# Patient Record
Sex: Female | Born: 2000
Health system: Southern US, Community
[De-identification: ages and names within clinical notes are randomized; demographics above are authoritative.]

## PROBLEM LIST (undated history)

## (undated) DIAGNOSIS — J45909 Unspecified asthma, uncomplicated: Secondary | ICD-10-CM

## (undated) HISTORY — DX: Unspecified asthma, uncomplicated: J45.909

## (undated) HISTORY — PX: BREAST REDUCTION SURGERY: SHX8

## (undated) HISTORY — PX: WISDOM TOOTH EXTRACTION: SHX21

---
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
## 2018-01-15 DIAGNOSIS — F419 Anxiety disorder, unspecified: Secondary | ICD-10-CM | POA: Diagnosis not present

## 2018-01-17 MED FILL — CITALOPRAM HBR 10 MG TABLET: 10 | 30 days supply | Qty: 30 | Fill #0

## 2018-02-05 MED FILL — LARIN FE 1-20 TABLET: 1-20 | 84 days supply | Qty: 84 | Fill #1

## 2018-02-20 ENCOUNTER — Ambulatory Visit (INDEPENDENT_AMBULATORY_CARE_PROVIDER_SITE_OTHER): Payer: 59

## 2018-02-20 ENCOUNTER — Ambulatory Visit (INDEPENDENT_AMBULATORY_CARE_PROVIDER_SITE_OTHER): Payer: 59 | Admitting: Orthopaedic Surgery

## 2018-02-20 ENCOUNTER — Other Ambulatory Visit (INDEPENDENT_AMBULATORY_CARE_PROVIDER_SITE_OTHER): Payer: Self-pay | Admitting: Radiology

## 2018-02-20 ENCOUNTER — Encounter (INDEPENDENT_AMBULATORY_CARE_PROVIDER_SITE_OTHER): Payer: Self-pay | Admitting: Orthopaedic Surgery

## 2018-02-20 VITALS — BP 131/78 | HR 86 | Ht 68.0 in | Wt 198.0 lb

## 2018-02-20 DIAGNOSIS — M25562 Pain in left knee: Secondary | ICD-10-CM

## 2018-02-20 MED ORDER — DICLOFENAC SODIUM 1 % TD GEL: TRANSDERMAL | 1 refills | Status: AC

## 2018-02-20 NOTE — Progress Notes (Signed)
Office Visit Note   Patient: Ashley Reed           Date of Birth: 2001/05/25           MRN: [id number] Visit Date: 02/20/2018              Requested by: No referring provider defined for this encounter. PCP: Kennon Holter, NP   Assessment & Plan: Visit Diagnoses:  1. 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  Physical therapy for quadricep strengthening exercises.  Voltaren gel.  Appropriate NSAIDs.  Office 4 to 6 weeks if no improvement consider local cortisone injection  Follow-Up Instructions: Return if symptoms worsen or fail to improve.   Orders:  Orders Placed This Encounter  Procedures  . XR KNEE 3 VIEW LEFT   No orders of the defined types were placed in this encounter.     Procedures: No procedures performed   Clinical Data: No additional findings.   Subjective: Chief Complaint  Patient presents with  . Left Knee - Pain  . New Patient (Initial Visit)    L KNEE PAIN FOR 3 WEEKS NO INJURY   17 year old female experienced insidious onset lateral anterior left knee pain 3 to 4 weeks ago.  She is had difficulty with bending stooping and squatting.  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  Had an exacerbation of her pain with repetitive bending.  No obvious effusion.  No injury.  Athletically active in the past without any significant injury.  No feeling of instability.  No numbness or tingling, no groin pain or back discomfort. HPI  Review of Systems  Constitutional: Negative for fatigue and fever.  HENT: Negative for ear pain.   Eyes: Negative for pain.  Respiratory: Negative for cough and shortness of breath.   Cardiovascular: Negative for leg swelling.  Gastrointestinal: Negative for constipation and diarrhea.  Genitourinary: Negative for difficulty urinating.  Musculoskeletal: Negative for back pain and neck pain.  Skin: Negative for rash.    Allergic/Immunologic: Negative for food allergies.  Neurological: Positive for weakness. Negative for numbness.  Hematological: Bruises/bleeds easily.  Psychiatric/Behavioral: Positive for sleep disturbance.     Objective: Vital Signs: BP 131/78 (BP Location: Right Arm, Patient Position: Sitting, Cuff Size: Normal)   Pulse 86   Ht 5\' 8"  (1.727 m)   Wt 198 lb (89.8 kg)   BMI 30.11 kg/m   Physical Exam  Ortho Exam awake alert and oriented x3.  Very pleasant.  Comfortable sitting.  Examination left knee with with a negative anterior drawer sign.  No opening with varus valgus stress.  No effusion.  No pain along the medial lateral joint.  No popping or clicking.  No popping or clicking with patella motion.  Full extension and flexion over 120 degrees.  There is tenderness along the lateral parapatellar region.  No plica formation.  Swelling distally.  Neurovascular exam intact  Specialty Comments:  No specialty comments available.  Imaging: Xr Knee 3 View Left  Result Date: 02/20/2018 Symptoms of left knee were obtained in 3 projections standing.  No evidence of abnormality in the AP and lateral views.  No acute change or ectopic calcification.  Joint spaces well-maintained.  Patella view there is a lateral patella tilt consistent with her symptoms of lateral patella pressure syndrome    PMFS History: There are no active problems to display for this patient.  History reviewed. No  pertinent past medical history.  History reviewed. No pertinent family history.  Past Surgical History:  Procedure Laterality Date  . BREAST REDUCTION SURGERY     Social History   Occupational History  . Not on file  Tobacco Use  . Smoking status: Never Smoker  . Smokeless tobacco: Never Used  Substance and Sexual Activity  . Alcohol use: Not Currently  . Drug use: Not Currently  . Sexual activity: Not on file

## 2018-02-21 ENCOUNTER — Telehealth (INDEPENDENT_AMBULATORY_CARE_PROVIDER_SITE_OTHER): Payer: Self-pay | Admitting: Orthopaedic Surgery

## 2018-02-21 MED FILL — DICLOFENAC SODIUM 1% GEL: 1 | 17 days supply | Qty: 200 | Fill #0

## 2018-02-21 NOTE — Telephone Encounter (Signed)
Ashley Reed from [facility] left a voicemail regarding patient's prescription for Voltaren 1% Gel.  We need to get the amount (1-4 grams)  that she has to apply to her large joint area.  Please return our call at [phone]

## 2018-02-21 NOTE — Telephone Encounter (Signed)
Spoke with pharmacist to fill rx. No action required

## 2018-02-21 NOTE — Telephone Encounter (Signed)
Notified pharmacy of amount to apply to area

## 2018-02-21 NOTE — Telephone Encounter (Signed)
Patient's mom called stating she went to [place] to pick up the prescription of Diclofenac Sodium but was told they didn't get the prescription.

## 2018-03-04 ENCOUNTER — Other Ambulatory Visit: Payer: Self-pay

## 2018-03-04 ENCOUNTER — Encounter: Payer: Self-pay | Admitting: Rehabilitative and Restorative Service Providers"

## 2018-03-04 ENCOUNTER — Ambulatory Visit: Payer: 59 | Attending: Orthopaedic Surgery | Admitting: Rehabilitative and Restorative Service Providers"

## 2018-03-04 DIAGNOSIS — M25562 Pain in left knee: Secondary | ICD-10-CM | POA: Diagnosis not present

## 2018-03-04 DIAGNOSIS — G8929 Other chronic pain: Secondary | ICD-10-CM | POA: Diagnosis not present

## 2018-03-04 DIAGNOSIS — M6281 Muscle weakness (generalized): Secondary | ICD-10-CM | POA: Diagnosis not present

## 2018-03-04 DIAGNOSIS — F419 Anxiety disorder, unspecified: Secondary | ICD-10-CM | POA: Diagnosis not present

## 2018-03-04 NOTE — Therapy (Signed)
[place], [place], [address] Phone: [phone]   Fax:  [phone]  Physical Therapy Evaluation  Patient Details  Name: Ashley Reed MRN: [id number] Date of Birth: 14-Sep-2001 Referring Provider: Durward Fortes   Encounter Date: 03/04/2018  PT End of Session - 03/04/18 1221    Visit Number  1    Number of Visits  12    Date for PT Re-Evaluation  04/15/18    PT Start Time  0930    PT Stop Time  1005    PT Time Calculation (min)  35 min    Activity Tolerance  Patient tolerated treatment well;No increased pain    Behavior During Therapy  [facility] for tasks assessed/performed       History reviewed. No pertinent past medical history.  Past Surgical History:  Procedure Laterality Date  . BREAST REDUCTION SURGERY      There were no vitals filed for this visit.   Subjective Assessment - 03/04/18 0930    Subjective  It hurts when I squat and stoop and play with kids.    Pertinent History  Pt is a Ship broker at [employer]. She is active at school and per her history. Has history of R knee pain but L knee pain has begun insidiously over the last several weeks. per chart, she has diagnosis of Lateral Patella Pressure syndrome L knee with chondromalacia patella. MD wants quad strengthening. RTD in 4-6 weeks for possible local cortisone injection if no change in pain. Xray 02/20/18 showing only a lateral patella tilt with no other findings.     Limitations  Lifting;Standing;Other (comment) stairs    How long can you sit comfortably?  > 60 minutes    How long can you stand comfortably?  > 60 minutes    How long can you walk comfortably?  1 hour    Diagnostic tests  Xray shows lateral patella tilt 02/20/18    Patient Stated Goals  to be able to do stairs and run without difficulty or pain    Currently in Pain?  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      Prior Function   Level of Independence  Independent      Cognition   Overall Cognitive Status  Within Functional Limits for tasks assessed      Coordination   Gross Motor Movements are Fluid and Coordinated  Yes      Functional Tests   Functional tests  -- able to squat but with pain      ROM / Strength   AROM / PROM / Strength  AROM;Strength  AROM   Overall AROM Comments  AROM WNL and =      Strength   Overall Strength Comments  Bil LE strength 5/5      Palpation   Palpation comment  hypermobility L knee all planes, moderate swelling inferior patella; with quad set, patella immediately shifts lateral, vastus lateralis and ITB overactive L      Special Tests   Other special tests  all special tests -                Objective measurements completed on examination: See above findings.                PT Short Term Goals - 03/04/18 1216      PT SHORT TERM GOAL #1   Title  Pt will be I with initial HEP    Baseline  issued at eval    Time  2    Period  Weeks    Status  New    Target Date  03/18/18      PT SHORT TERM GOAL #2   Title  Pt will report 50% reduced L knee pain with squatting    Baseline  unable without discomfort    Time  2    Period  Weeks    Status  New        PT Long Term Goals - 03/04/18 1217      PT LONG TERM GOAL #1   Title  Pt will be able to run down a hill without L knee pain    Baseline   unable    Time  6    Period  Weeks    Status  New    Target Date  04/15/18      PT LONG TERM GOAL #2   Title  Pt will be able to squat to the floor without pain    Baseline  unable    Time  6    Period  Weeks    Status  New    Target Date  04/15/18      PT LONG TERM GOAL #3   Title  Pt will be able to return to working out without difficulty    Baseline  unable    Time  6    Period  Weeks    Status  New    Target Date  04/15/18             Plan - 03/04/18 1010    Clinical Impression Statement  Pt presents to PT with L shifting patella all directions but with emphasis on lateral shift with quad activation. She has moderate swelling inferopatella. Strength and AROM WNL and =. Pt would benefit from PT for L vastus lateralis and ITB flexibility exercises, L hip adductor/extensor strengthening, and patella taping as appropriate to assist with proper muscle activation.     History and Personal Factors relevant to plan of care:  pt is active in her community; student at [employer] Presentation  Stable    Clinical Presentation due to:  pt motivation to get stronger and return to PLOF without difficulty or deficit    Clinical Decision Making  Moderate    Rehab Potential  Excellent    PT Frequency  Other (comment) 2x/week every 1- 2 weeks as needed    PT Duration  Other (comment) pt to be seen 1x in 2 weeks to assess pain and schedule accordingly depending on reports  PT Treatment/Interventions  ADLs/Self Care Home Management;Manual techniques;Functional mobility training;Therapeutic activities;Patient/family education;Taping;Vasopneumatic Device;Therapeutic exercise;Cryotherapy    PT Next Visit Plan  review HEP, continue L ITB/lateral quad flexibility, taping to correct lateral shifting patella (?), inner thigh and hip extensor strengthening    PT Home Exercise Plan  bridge with ball squeeze, L ITB stretch with sheet, L Piriformis stretch, L quad stretch, sidelying hip  adduction    Recommended Other Services  n/a    Consulted and Agree with Plan of Care  Patient       Patient will benefit from skilled therapeutic intervention in order to improve the following deficits and impairments:  Pain, Hypermobility, Decreased activity tolerance  Visit Diagnosis: Muscle weakness (generalized)  Chronic pain of left knee     Problem List There are no active problems to display for this patient.   Myra Rude, PT 03/04/2018, 12:35 PM  [facility] [address] [place], [place], [address] Phone: [phone]   Fax:  [phone]  Name: Ashley Reed MRN: [id number] Date of Birth: 2001-03-17

## 2018-03-04 NOTE — Patient Instructions (Addendum)
HO

## 2018-03-18 ENCOUNTER — Ambulatory Visit: Payer: 59 | Attending: Orthopaedic Surgery

## 2018-03-18 DIAGNOSIS — G8929 Other chronic pain: Secondary | ICD-10-CM | POA: Insufficient documentation

## 2018-03-18 DIAGNOSIS — M6281 Muscle weakness (generalized): Secondary | ICD-10-CM | POA: Diagnosis not present

## 2018-03-18 DIAGNOSIS — M25562 Pain in left knee: Secondary | ICD-10-CM | POA: Diagnosis not present

## 2018-03-18 NOTE — Patient Instructions (Addendum)
Hip Extension: Hamstring Single Leg Deadlift (Eccentric)   Holding weights, stand on affected leg with knee slightly flexed. Lift other leg while slowly bending forward at the hip. Use ___ lb weight. ___ reps per set, ___ sets per day, ___ days per week. Add ___ lbs when you achieve ___ repetitions. Touch floor with weight.  Copyright  VHI. All rights reserved.  Squat: Wide Leg   Feet wide apart, toes out, squat, holding weight between knees. Weight should be at ankles.  Bend at the hips with hips going back behind hips. Keep back straight.  Put eight on stool if you cannot get to floor.  Repeat _3-15___ times per set. Do ___1-2_ sets per session. Do __2-5__ sessions per week. Use __0-10__ lb weight.  Copyright  VHI. All rights reserved.  Squat: Half   Arms hanging at sides, squat by dropping hips back as if sitting on a chair. Keep knees over ankles, hips behind heels.  Keep back straight.  Bend at hips and knees will bend with hips.   Repeat ___3-15_ times per set. Do __1-2__ sets per session. Do __2-5__ sessions per week. Use __0-10HIP / KNEE: Flexion / Extension, Squat Unilateral Hip / Glute Extension: Standing - Straight Leg (Machine) Hip Extension (Standing) Healthy Back - Yoga Tree Balance Wall Sit    Back against wall, slide down so knees are at 90 angle. Hold __5-10__ seconds. Do __1-2__ sets. Complete __10-20__ repetitions.  http://st.exer.us/295   Copyright  VHI. All rights reserved.

## 2018-03-18 NOTE — Therapy (Addendum)
[place], [place], [address] Phone: [phone]   Fax:  [phone]  Physical Therapy Treatment/ Discharge  Patient Details  Name: Ashley Reed MRN: [id number] Date of Birth: 2001-04-21 Referring Provider: Durward Fortes   Encounter Date: 03/18/2018  PT End of Session - 03/18/18 1535    Visit Number  2    Number of Visits  12    Date for PT Re-Evaluation  04/15/18    PT Start Time  0335    PT Stop Time  0415    PT Time Calculation (min)  40 min    Activity Tolerance  Patient tolerated treatment well;No increased pain    Behavior During Therapy  [facility] for tasks assessed/performed       History reviewed. No pertinent past medical history.  Past Surgical History:  Procedure Laterality Date  . BREAST REDUCTION SURGERY      There were no vitals filed for this visit.  Subjective Assessment - 03/18/18 1536    Subjective  Since last visit pain variable . Pain mor proximal patella .   No trouble with HEP.    No taping of patella.      Currently in Pain?  No/denies                       [facility] Adult PT Treatment/Exercise - 03/18/18 0001      Exercises   Exercises  Knee/Hip      Knee/Hip Exercises: Stretches   Passive Hamstring Stretch  Left;30 seconds;1 rep    Quad Stretch  Left;1 rep;30 seconds    Piriformis Stretch  Left;30 seconds      Knee/Hip Exercises: Standing   Wall Squat  15 reps;5 seconds    Other Standing Knee Exercises  single leg and double leg hip hinge x 15 LT       Knee/Hip Exercises: Supine   Bridges with Ball Squeeze  Both;15 reps    Straight Leg Raise with External Rotation  Left;15 reps      Manual Therapy   Manual Therapy  Taping    Kinesiotex  Facilitate Muscle      Kinesiotix   Facilitate Muscle   proximal medial taping for VMO and medical patella tracking             PT Education - 03/18/18 1623    Education Details  HEP . tape management to  remove with any skin irritation or in 3-4 days and can shower with tape    Person(s) Educated  Patient    Methods  Explanation;Demonstration;Tactile cues;Verbal cues;Handout    Comprehension  Verbalized understanding;Returned demonstration       PT Short Term Goals - 03/18/18 1620      PT SHORT TERM GOAL #1   Title  Pt will be I with initial HEP    Status  Achieved      PT SHORT TERM GOAL #2   Title  Pt will report 50% reduced L knee pain with squatting    Baseline  cont discomfort    Status  On-going        PT Long Term Goals - 03/04/18 1217      PT LONG TERM GOAL #1   Title  Pt will be able to run down a hill without L knee pain    Baseline  unable    Time  6    Period  Weeks    Status  New  Target Date  04/15/18      PT LONG TERM GOAL #2   Title  Pt will be able to squat to the floor without pain    Baseline  unable    Time  6    Period  Weeks    Status  New    Target Date  04/15/18      PT LONG TERM GOAL #3   Title  Pt will be able to return to working out without difficulty    Baseline  unable    Time  6    Period  Weeks    Status  New    Target Date  04/15/18            Plan - 03/18/18 1535    Clinical Impression Statement  Her pain is improved some with pain proximal patella now. She was able to do the new exercises  without increased pain thiugh she was cautioned to modify if haviung any pain.   She will return in 7-10 days and we will progress exercises if able and change taping if needed    PT Treatment/Interventions  ADLs/Self Care Home Management;Manual techniques;Functional mobility training;Therapeutic activities;Patient/family education;Taping;Vasopneumatic Device;Therapeutic exercise;Cryotherapy    PT Next Visit Plan  , continue L ITB/lateral quad flexibility, taping to correct lateral shifting patella (?), inner thigh and hip extensor strengthening review new HEP and taping    PT Home Exercise Plan  bridge with ball squeeze, L ITB stretch  with sheet, L Piriformis stretch, L quad stretch, sidelying hip adduction,   7/2 dead lift , single leg hip hinge , wall sit    Consulted and Agree with Plan of Care  Patient       Patient will benefit from skilled therapeutic intervention in order to improve the following deficits and impairments:  Pain, Hypermobility, Decreased activity tolerance  Visit Diagnosis: Muscle weakness (generalized)  Chronic pain of left knee     Problem List There are no active problems to display for this patient.   Darrel Hoover  PT 03/18/2018, 4:24 PM  [facility] [address] [place], [place], [address] Phone: [phone]   Fax:  [phone]  Name: LAKYNN HALVORSEN MRN: [id number] Date of Birth: [birth year]  PHYSICAL THERAPY DISCHARGE SUMMARY  Visits from Start of Care: 2  Current functional level related to goals / functional outcomes: Unknown as she did not return   Remaining deficits: Unknown   Education / Equipment: HEP Plan:                                                    Patient goals were not met. Patient is being discharged due to not returning since the last visit.  ?????    Noralee Stain PT

## 2018-04-01 ENCOUNTER — Telehealth: Payer: Self-pay | Admitting: Physical Therapy

## 2018-04-01 ENCOUNTER — Ambulatory Visit: Payer: 59

## 2018-04-01 NOTE — Telephone Encounter (Signed)
Message left that she had no more scheduled appointments and asked for her to call if she wants discharge or continuation of PT. Clinic number left on message.

## 2018-04-08 ENCOUNTER — Ambulatory Visit: Payer: 59

## 2018-04-28 MED FILL — NORETHIN-ESTRAD-FERR 1-0.02: 1-20 | 84 days supply | Qty: 84 | Fill #2

## 2018-04-28 MED FILL — CITALOPRAM HBR 10 MG TABLET: 10 | 30 days supply | Qty: 30 | Fill #1

## 2018-06-13 ENCOUNTER — Ambulatory Visit (INDEPENDENT_AMBULATORY_CARE_PROVIDER_SITE_OTHER): Payer: 59

## 2018-06-13 ENCOUNTER — Ambulatory Visit: Payer: Self-pay

## 2018-06-13 DIAGNOSIS — Z23 Encounter for immunization: Secondary | ICD-10-CM | POA: Diagnosis not present

## 2018-07-21 ENCOUNTER — Telehealth: Payer: 59 | Admitting: Nurse Practitioner

## 2018-07-21 ENCOUNTER — Encounter (INDEPENDENT_AMBULATORY_CARE_PROVIDER_SITE_OTHER): Payer: Self-pay

## 2018-07-21 DIAGNOSIS — M5441 Lumbago with sciatica, right side: Secondary | ICD-10-CM

## 2018-07-21 DIAGNOSIS — M5442 Lumbago with sciatica, left side: Secondary | ICD-10-CM | POA: Diagnosis not present

## 2018-07-21 MED ORDER — NAPROXEN 500 MG PO TABS: 500.0000 mg | ORAL_TABLET | Freq: Two times a day (BID) | ORAL | 1 refills | Status: DC

## 2018-07-21 MED ORDER — CYCLOBENZAPRINE HCL 10 MG PO TABS: 10.0000 mg | ORAL_TABLET | Freq: Three times a day (TID) | ORAL | 1 refills | Status: DC | PRN

## 2018-07-21 NOTE — Progress Notes (Signed)

## 2018-07-23 MED FILL — NORETHIN-ESTRAD-FERR 1-0.02: 1-20 | 84 days supply | Qty: 84 | Fill #3

## 2018-08-11 ENCOUNTER — Telehealth: Payer: 59 | Admitting: Physician Assistant

## 2018-08-11 DIAGNOSIS — J029 Acute pharyngitis, unspecified: Secondary | ICD-10-CM

## 2018-08-11 MED ORDER — AMOXICILLIN-POT CLAVULANATE 875-125 MG PO TABS: 1.0000 | ORAL_TABLET | Freq: Two times a day (BID) | ORAL | 0 refills | Status: DC

## 2018-08-11 NOTE — Progress Notes (Signed)
We are sorry that you are not feeling well.  Here is how we plan to help!  Based on what you have shared with me it is likely that you have strep pharyngitis.  Strep pharyngitis is inflammation and infection in the back of the throat.  This is an infection cause by bacteria and is treated with antibiotics.  I have prescribed Augmentin 875mg /125mg  one tablet twice daily with food, for 7 days.  For throat pain, we recommend over the counter oral pain relief medications such as acetaminophen or aspirin, or anti-inflammatory medications such as ibuprofen or naproxen sodium. Topical treatments such as oral throat lozenges or sprays may be used as needed. Strep infections are not as easily transmitted as other respiratory infections, however we still recommend that you avoid close contact with loved ones, especially the very young and elderly.  Remember to wash your hands thoroughly throughout the day as this is the number one way to prevent the spread of infection and wipe down door knobs and counters with disinfectant.   Home Care:  Only take medications as instructed by your medical team.  Complete the entire course of an antibiotic.  Do not take these medications with alcohol.  A steam or ultrasonic humidifier can help congestion.  You can place a towel over your head and breathe in the steam from hot water coming from a faucet.  Avoid close contacts especially the very young and the elderly.  Cover your mouth when you cough or sneeze.  Always remember to wash your hands.  Get Help Right Away If:  You develop worsening fever or sinus pain.  You develop a severe head ache or visual changes.  Your symptoms persist after you have completed your treatment plan.  Make sure you  Understand these instructions.  Will watch your condition.  Will get help right away if you are not doing well or get worse.  Your e-visit answers were reviewed by a board certified advanced clinical practitioner  to complete your personal care plan.  Depending on the condition, your plan could have included both over the counter or prescription medications.  If there is a problem please reply  once you have received a response from your provider.  Your safety is important to [place].  If you have drug allergies check your prescription carefully.    You can use MyChart to ask questions about today's visit, request a non-urgent call back, or ask for a work or school excuse for 24 hours related to this e-Visit. If it has been greater than 24 hours you will need to follow up with your provider, or enter a new e-Visit to address those concerns.  You will get an e-mail in the next two days asking about your experience.  I hope that your e-visit has been valuable and will speed your recovery. Thank you for using e-visits.

## 2018-08-12 ENCOUNTER — Telehealth: Payer: 59 | Admitting: Nurse Practitioner

## 2018-08-12 ENCOUNTER — Ambulatory Visit (INDEPENDENT_AMBULATORY_CARE_PROVIDER_SITE_OTHER): Payer: 59 | Admitting: Family Medicine

## 2018-08-12 ENCOUNTER — Encounter (INDEPENDENT_AMBULATORY_CARE_PROVIDER_SITE_OTHER): Payer: Self-pay | Admitting: Family Medicine

## 2018-08-12 DIAGNOSIS — M545 Low back pain, unspecified: Secondary | ICD-10-CM

## 2018-08-12 DIAGNOSIS — J020 Streptococcal pharyngitis: Secondary | ICD-10-CM | POA: Diagnosis not present

## 2018-08-12 MED ORDER — AMOXICILLIN-POT CLAVULANATE 875-125 MG PO TABS: 1.0000 | ORAL_TABLET | Freq: Two times a day (BID) | ORAL | 0 refills | Status: DC

## 2018-08-12 MED ORDER — AMOXICILLIN 875 MG PO TABS: 875.0000 mg | ORAL_TABLET | Freq: Two times a day (BID) | ORAL | 0 refills | Status: DC

## 2018-08-12 MED ORDER — TIZANIDINE HCL 2 MG PO TABS: 2.0000 mg | ORAL_TABLET | Freq: Four times a day (QID) | ORAL | 1 refills | Status: DC | PRN

## 2018-08-12 MED FILL — tiZANidine HCL 2 MG TABS: 2 | 7 days supply | Qty: 60 | Fill #0
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
## 2018-08-12 NOTE — Progress Notes (Signed)
We are sorry that you are not feeling well.  Here is how we plan to help!  Based on what you have shared with me it is likely that you have strep pharyngitis.  Strep pharyngitis is inflammation and infection in the back of the throat.  This is an infection cause by bacteria and is treated with antibiotics.  I have prescribed Augmentin 875mg /125mg  one tablet twice daily with food, for 7 days..  For throat pain, we recommend over the counter oral pain relief medications such as acetaminophen or aspirin, or anti-inflammatory medications such as ibuprofen or naproxen sodium. Topical treatments such as oral throat lozenges or sprays may be used as needed. Strep infections are not as easily transmitted as other respiratory infections, however we still recommend that you avoid close contact with loved ones, especially the very young and elderly.  Remember to wash your hands thoroughly throughout the day as this is the number one way to prevent the spread of infection and wipe down door knobs and counters with disinfectant.   Home Care:  Only take medications as instructed by your medical team.  Complete the entire course of an antibiotic.  Do not take these medications with alcohol.  A steam or ultrasonic humidifier can help congestion.  You can place a towel over your head and breathe in the steam from hot water coming from a faucet.  Avoid close contacts especially the very young and the elderly.  Cover your mouth when you cough or sneeze.  Always remember to wash your hands.  Get Help Right Away If:  You develop worsening fever or sinus pain.  You develop a severe head ache or visual changes.  Your symptoms persist after you have completed your treatment plan.  Make sure you  Understand these instructions.  Will watch your condition.  Will get help right away if you are not doing well or get worse.  Your e-visit answers were reviewed by a board certified advanced clinical  practitioner to complete your personal care plan.  Depending on the condition, your plan could have included both over the counter or prescription medications.  If there is a problem please reply  once you have received a response from your provider.  Your safety is important to [place].  If you have drug allergies check your prescription carefully.    You can use MyChart to ask questions about today's visit, request a non-urgent call back, or ask for a work or school excuse for 24 hours related to this e-Visit. If it has been greater than 24 hours you will need to follow up with your provider, or enter a new e-Visit to address those concerns.  You will get an e-mail in the next two days asking about your experience.  I hope that your e-visit has been valuable and will speed your recovery. Thank you for using e-visits.

## 2018-09-15 ENCOUNTER — Ambulatory Visit (INDEPENDENT_AMBULATORY_CARE_PROVIDER_SITE_OTHER): Payer: 59 | Admitting: Orthopaedic Surgery

## 2018-09-16 DIAGNOSIS — Z113 Encounter for screening for infections with a predominantly sexual mode of transmission: Secondary | ICD-10-CM | POA: Diagnosis not present

## 2018-09-16 DIAGNOSIS — N760 Acute vaginitis: Secondary | ICD-10-CM | POA: Diagnosis not present

## 2018-09-16 DIAGNOSIS — Z01419 Encounter for gynecological examination (general) (routine) without abnormal findings: Secondary | ICD-10-CM | POA: Diagnosis not present

## 2018-09-16 DIAGNOSIS — Z6831 Body mass index (BMI) 31.0-31.9, adult: Secondary | ICD-10-CM | POA: Diagnosis not present

## 2018-09-18 ENCOUNTER — Encounter (INDEPENDENT_AMBULATORY_CARE_PROVIDER_SITE_OTHER): Payer: Self-pay | Admitting: Orthopaedic Surgery

## 2018-09-18 ENCOUNTER — Ambulatory Visit (INDEPENDENT_AMBULATORY_CARE_PROVIDER_SITE_OTHER): Payer: 59

## 2018-09-18 ENCOUNTER — Ambulatory Visit (INDEPENDENT_AMBULATORY_CARE_PROVIDER_SITE_OTHER): Payer: 59 | Admitting: Orthopaedic Surgery

## 2018-09-18 VITALS — BP 124/84 | HR 77 | Ht 69.0 in | Wt 217.0 lb

## 2018-09-18 DIAGNOSIS — M545 Low back pain, unspecified: Secondary | ICD-10-CM
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
## 2018-10-13 MED FILL — BLISOVI FE 1/20 1-20 MG-MCG: 1-20 | 84 days supply | Qty: 84 | Fill #0

## 2018-11-20 ENCOUNTER — Telehealth: Payer: 59 | Admitting: Physician Assistant

## 2018-11-20 DIAGNOSIS — J020 Streptococcal pharyngitis: Secondary | ICD-10-CM

## 2018-11-20 MED ORDER — AMOXICILLIN-POT CLAVULANATE 875-125 MG PO TABS: 1.0000 | ORAL_TABLET | Freq: Two times a day (BID) | ORAL | 0 refills | Status: DC

## 2018-11-20 NOTE — Progress Notes (Signed)
I have spent 5 minutes in review of e-visit questionnaire, review and updating patient chart, medical decision making and response to patient.   Sparrow Sanzo Cody Tenzin Edelman, PA-C    

## 2018-11-20 NOTE — Progress Notes (Signed)

## 2018-12-29 MED FILL — BLISOVI FE 1/20 1-20 MG-MCG: 1-20 | 84 days supply | Qty: 84 | Fill #0

## 2019-01-26 DIAGNOSIS — K30 Functional dyspepsia: Secondary | ICD-10-CM | POA: Diagnosis not present

## 2019-01-26 DIAGNOSIS — R1011 Right upper quadrant pain: Secondary | ICD-10-CM | POA: Diagnosis not present

## 2019-01-29 DIAGNOSIS — K30 Functional dyspepsia: Secondary | ICD-10-CM | POA: Diagnosis not present

## 2019-01-29 DIAGNOSIS — R109 Unspecified abdominal pain: Secondary | ICD-10-CM | POA: Diagnosis not present

## 2019-03-03 MED FILL — tiZANidine HCL 2 MG TABS: 2 | 7 days supply | Qty: 60 | Fill #1

## 2019-03-11 DIAGNOSIS — R51 Headache: Secondary | ICD-10-CM | POA: Diagnosis not present

## 2019-03-11 MED FILL — PROPRANOLOL HCL 10 MG TAB: 10 | 30 days supply | Qty: 30 | Fill #0

## 2019-03-30 MED FILL — BLISOVI FE 1/20 1-20 MG-MCG: 1-20 | 84 days supply | Qty: 84 | Fill #1

## 2019-04-03 ENCOUNTER — Encounter: Payer: Self-pay | Admitting: Allergy & Immunology

## 2019-04-03 ENCOUNTER — Other Ambulatory Visit: Payer: Self-pay

## 2019-04-03 ENCOUNTER — Ambulatory Visit (INDEPENDENT_AMBULATORY_CARE_PROVIDER_SITE_OTHER): Payer: 59 | Admitting: Allergy & Immunology

## 2019-04-03 DIAGNOSIS — J3089 Other allergic rhinitis: Secondary | ICD-10-CM | POA: Diagnosis not present

## 2019-04-03 DIAGNOSIS — J4520 Mild intermittent asthma, uncomplicated: Secondary | ICD-10-CM

## 2019-04-03 DIAGNOSIS — J0100 Acute maxillary sinusitis, unspecified: Secondary | ICD-10-CM

## 2019-04-03 MED ORDER — AMOXICILLIN-POT CLAVULANATE 875-125 MG PO TABS: 1.0000 | ORAL_TABLET | Freq: Two times a day (BID) | ORAL | 0 refills | Status: AC

## 2019-04-03 MED FILL — AMOX-CLAV 875-125 MG TABLET: 875-125 | 10 days supply | Qty: 20 | Fill #0

## 2019-04-03 NOTE — Patient Instructions (Addendum)
1. Perennial allergic rhinitis (dust mites) - Dust mite avoidance measures provided. - Start Nasacort one spray per nostril daily throughout the year to see if this can prevent the chronic congestion.  2. Mild intermittent asthma, uncomplicated - Continue with the as needed use of the Advair one puff twice daily for 1-2 weeks during flares. - Continue with albuterol 4 puffs every 4-6 hours as needed. - Consider changing to a different PRN medication, as Advair Diskus is not frequently covered well by insurance companies.   3. Acute non-recurrent maxillary sinusitis - With your current symptoms and time course, antibiotics are needed: Augmentin 875mg  twice daily for 10 days - If symptoms are not improving in 3-4 days, feel free to call us back and we can add on a prednisone burst.  - Add on nasal saline spray (i.e., Simply Saline) or nasal saline lavage (i.e., NeilMed) as needed prior to medicated nasal sprays. - Continue with guaifenesin [phone] mg (Mucinex) twice daily as needed for mucous thinning with adequate hydration to help it work.   4. Return in about 1 year (around 04/02/2020). This can be an in-person, a virtual Webex or a telephone follow up visit.   Please inform us of any Emergency Department visits, hospitalizations, or changes in symptoms. Call us before going to the ED for breathing or allergy symptoms since we might be able to fit you in for a sick visit. Feel free to contact us anytime with any questions, problems, or concerns.  It was a pleasure to talk to you today today!  Websites that have reliable patient information: 1. American Academy of Asthma, Allergy, and Immunology: www.aaaai.org 2. Food Allergy Research and Education (FARE): foodallergy.org 3. Mothers of Asthmatics: http://www.asthmacommunitynetwork.org 4. American College of Allergy, Asthma, and Immunology: www.acaai.org  "Like" [place] on Facebook and Instagram for our latest updates!      Make sure you  are registered to vote! If you have moved or changed any of your contact information, you will need to get this updated before voting!  In some cases, you MAY be able to register to vote online: [email or web address]    Voter ID laws are NOT going into effect for the General Election in November 2020! DO NOT let this stop you from exercising your right to vote!   Absentee voting is the SAFEST way to vote during the coronavirus pandemic!   Download and print an absentee ballot request form at [email or web address] or you can scan the QR code below with your smart phone:      More information on absentee ballots can be found here: [email or web address]

## 2019-04-03 NOTE — Progress Notes (Signed)
RE: RANADA VIGORITO MRN: [id number] DOB: 07/19/2001 Date of Telemedicine Visit: 04/03/2019  Referring provider: No ref. provider found Primary care provider: Nathaneil Canary, MD  Chief Complaint: Sinusitis   Telemedicine Follow Up Visit via Telephone: I connected with Carlisle Beers for a follow up on 04/03/19 by telephone and verified that I am speaking with the correct person using two identifiers.   I discussed the limitations, risks, security and privacy concerns of performing an evaluation and management service by telephone and the availability of in person appointments. I also discussed with the patient that there may be a patient responsible charge related to this service. The patient expressed understanding and agreed to proceed.  Patient is at work accompanied by her mother who provided/contributed to the history.  Provider is at the office.  Visit start time: 8:40 AM Visit end time: 8:53 AM Insurance consent/check in by: [facility] consent and medical assistant/nurse: Ashleigh  History of Present Illness:  She is a 18 y.o. female, who is being followed for allergic rhinitis and asthma. Her previous allergy office visit was in July 2015 with Dr. Neldon Mc.  She was last seen in our office and 2015 by Dr. Neldon Mc.  At that time, her asthma was under good control with the as needed use of albuterol and Advair 100/50 mcg.  She was having some intermittent urticaria and Dr. Neldon Mc recommended the use of prednisone at the first sign of an urticarial outbreak.  Her last testing was done in July 2015 and was positive only to dust mites.  She also had the entire food panel tested and this was negative. She has received flu shots intermittently since that   Since the last month, she has been having sinus pressure and headaches intermittently. She has been using Mucinex and Sudafed for one month. She reports that she takes Mucinex more often than not. She does not use a daily nose sprays. She  starts this when she starts having congestion.  She has never needed antibiotics for sinus infections. She did have Strep throat on a few occasions, but normally never gets antibiotics for sinus infections.  She was just had to use her Mucinex for a month or so and then she feels felt better for a week before it returns.  I did review her allergy testing from 5 years ago and she is not doing anything to mitigate dust mite exposure, but she is very open to doing this.  She does have a history of asthma, although it is not a problem very often at all.  She has some Advair at home which she uses for a week or 2 when she feels short of breath.  She has 1 more unopened box at home.  She does not know how much it is with her insurance, which gives you an idea of how often she actually uses it.  She denies nighttime coughing or wheezing on a routine basis.  She has not needed prednisone for her breathing in quite some time.  Of note, she has migraines. She started on naproxen at night for her migraines. This was prescribe by her PCP in [place] - Dr. Nathaneil Canary at [facility] in [place].   Otherwise, there have been no changes to her past medical history, surgical history, family history, or social history.  Assessment and Plan:  Hydia is a 18 y.o. female with:  Perennial allergic rhinitis (dust mites)  Mild intermittent asthma, uncomplicated  Acute non-recurrent maxillary sinusitis   [facility]  presents for a sick visit.  She reports nasal congestion and sinus pain for the past month.  This has been unresponsive to Sudafed as well as Mucinex.  Because of this, we are going to start a course of Augmentin.  She will call us back and let us know how she is doing on Monday and we could start a prednisone burst if needed at that time.  I did review her chart and she is allergic to dust mites.  This seems to be a surprise to her.  I did recommend that she get bedcovers for both the bed and the  pillow to mitigate dust mite exposure.  I also think it would be useful for her to do a nasal steroid 1 spray per nostril daily to try to control the allergic inflammation.  This might also help her headaches in the long run as well.  We did provide samples of Nasacort for her to use.  Her asthma seems to be well controlled for the most part.  She does have Advair which she is used for years on an as-needed basis.  This is not the traditional use of a combined ICS/LABA, but since it is working for her we will continue with that.  I did offer to change her to an alternative to Advair if it became too expensive for her.    1. Perennial allergic rhinitis (dust mites) - Dust mite avoidance measures provided. - Start Nasacort one spray per nostril daily throughout the year to see if this can prevent the chronic congestion.  2. Mild intermittent asthma, uncomplicated - Continue with the as needed use of the Advair one puff twice daily for 1-2 weeks during flares. - Continue with albuterol 4 puffs every 4-6 hours as needed. - Consider changing to a different PRN medication, as Advair Diskus is not frequently covered well by insurance companies.   3. Acute non-recurrent maxillary sinusitis - With your current symptoms and time course, antibiotics are needed: Augmentin 875mg  twice daily for 10 days - If symptoms are not improving in 3-4 days, feel free to call us back and we can add on a prednisone burst.  - Add on nasal saline spray (i.e., Simply Saline) or nasal saline lavage (i.e., NeilMed) as needed prior to medicated nasal sprays. - Continue with guaifenesin [phone] mg (Mucinex) twice daily as needed for mucous thinning with adequate hydration to help it work.   4. Return in about 1 year (around 04/02/2020). This can be an in-person, a virtual Webex or a telephone follow up visit.      Diagnostics: None.  Medication List:  Current Outpatient Medications  Medication Sig Dispense Refill  .  adapalene (DIFFERIN) 0.1 % cream APPLY PEA SIZED AMOUNT TO FINGER AND SPREAD THIN LAYER TO ACNE AREAS ONCE A DAY IN MORNING    . albuterol (PROVENTIL HFA;VENTOLIN HFA) 108 (90 Base) MCG/ACT inhaler Inhale 2 puffs 15 minutes prior to exercise and every 4 hours prn cough, wheeze with asthma flare    . amoxicillin-clavulanate (AUGMENTIN) 875-125 MG tablet Take 1 tablet by mouth 2 (two) times daily for 10 days. 20 tablet 0  . citalopram (CELEXA) 10 MG tablet Take 10 mg by mouth every morning.  2  . cyclobenzaprine (FLEXERIL) 10 MG tablet Take 1 tablet (10 mg total) by mouth 3 (three) times daily as needed for muscle spasms. 30 tablet 1  . diclofenac sodium (VOLTAREN) 1 % GEL Apply to large joint area up to three times a day  as needed 2 Tube 1  . Fluticasone-Salmeterol (ADVAIR) 100-50 MCG/DOSE AEPB Inhale into the lungs.    [name] LARIN FE 1/20 1-20 MG-MCG tablet Take 1 tablet by mouth daily.  4  . naproxen (NAPROSYN) 500 MG tablet Take 1 tablet (500 mg total) by mouth 2 (two) times daily with a meal. 60 tablet 1  . tiZANidine (ZANAFLEX) 2 MG tablet Take 1-2 tablets (2-4 mg total) by mouth every 6 (six) hours as needed for muscle spasms. 60 tablet 1   No current facility-administered medications for this visit.    Allergies: No Known Allergies I reviewed her past medical history, social history, family history, and environmental history and no significant changes have been reported from previous visits.  Review of Systems  Constitutional: Negative for activity change, appetite change, chills, fatigue and fever.  HENT: Positive for congestion, sinus pressure and sinus pain. Negative for postnasal drip, rhinorrhea and sore throat.   Eyes: Negative for pain, discharge, redness and itching.  Respiratory: Negative for shortness of breath, wheezing and stridor.   Gastrointestinal: Negative for diarrhea, nausea and vomiting.  Endocrine: Negative for cold intolerance and heat intolerance.  Musculoskeletal:  Negative for arthralgias, joint swelling and myalgias.  Skin: Negative for rash.  Allergic/Immunologic: Negative for environmental allergies, food allergies and immunocompromised state.    Objective:  Physical exam not obtained as encounter was done via telephone.   Previous notes and tests were reviewed.  I discussed the assessment and treatment plan with the patient. The patient was provided an opportunity to ask questions and all were answered. The patient agreed with the plan and demonstrated an understanding of the instructions.   The patient was advised to call back or seek an in-person evaluation if the symptoms worsen or if the condition fails to improve as anticipated.  I provided 13 minutes of non-face-to-face time during this encounter.  It was my pleasure to participate in [employer] care today. Please feel free to contact me with any questions or concerns.   Sincerely,  Valentina Shaggy, MD

## 2019-04-07 MED FILL — PROPRANOLOL HCL 10 MG TAB: 10 | 30 days supply | Qty: 30 | Fill #1

## 2019-04-17 ENCOUNTER — Ambulatory Visit: Payer: 59 | Admitting: Allergy & Immunology

## 2019-05-10 DIAGNOSIS — Z20828 Contact with and (suspected) exposure to other viral communicable diseases: Secondary | ICD-10-CM | POA: Diagnosis not present

## 2019-06-22 ENCOUNTER — Other Ambulatory Visit: Payer: Self-pay

## 2019-06-22 ENCOUNTER — Ambulatory Visit (INDEPENDENT_AMBULATORY_CARE_PROVIDER_SITE_OTHER): Payer: 59 | Admitting: *Deleted

## 2019-06-22 DIAGNOSIS — Z23 Encounter for immunization: Secondary | ICD-10-CM | POA: Diagnosis not present

## 2019-06-22 DIAGNOSIS — R51 Headache with orthostatic component, not elsewhere classified: Secondary | ICD-10-CM | POA: Diagnosis not present

## 2019-06-22 DIAGNOSIS — F419 Anxiety disorder, unspecified: Secondary | ICD-10-CM | POA: Diagnosis not present

## 2019-06-22 MED FILL — BLISOVI FE 1/20 1-20 MG-MCG: 1-20 | 84 days supply | Qty: 84 | Fill #2

## 2019-08-11 DIAGNOSIS — F419 Anxiety disorder, unspecified: Secondary | ICD-10-CM | POA: Diagnosis not present

## 2019-08-19 MED FILL — ADVAIR 250/50 DISKUS: 250-50 | 30 days supply | Qty: 60 | Fill #0

## 2019-08-20 MED FILL — PROPRANOLOL 20 MG TABLET: 20 | 90 days supply | Qty: 90 | Fill #0

## 2019-09-14 MED FILL — BLISOVI FE 1/20 1-20 MG-MCG: 1-20 | 84 days supply | Qty: 84 | Fill #0

## 2019-09-16 DIAGNOSIS — R51 Headache with orthostatic component, not elsewhere classified: Secondary | ICD-10-CM | POA: Diagnosis not present

## 2019-09-16 DIAGNOSIS — H53453 Other localized visual field defect, bilateral: Secondary | ICD-10-CM | POA: Diagnosis not present

## 2019-09-21 DIAGNOSIS — H53453 Other localized visual field defect, bilateral: Secondary | ICD-10-CM | POA: Diagnosis not present

## 2019-09-22 DIAGNOSIS — F419 Anxiety disorder, unspecified: Secondary | ICD-10-CM | POA: Diagnosis not present

## 2019-09-22 DIAGNOSIS — R519 Headache, unspecified: Secondary | ICD-10-CM | POA: Diagnosis not present

## 2019-10-13 DIAGNOSIS — J029 Acute pharyngitis, unspecified: Secondary | ICD-10-CM | POA: Diagnosis not present

## 2019-10-13 DIAGNOSIS — R0981 Nasal congestion: Secondary | ICD-10-CM | POA: Diagnosis not present

## 2019-10-20 DIAGNOSIS — G43009 Migraine without aura, not intractable, without status migrainosus: Secondary | ICD-10-CM | POA: Insufficient documentation

## 2019-10-20 DIAGNOSIS — Z6831 Body mass index (BMI) 31.0-31.9, adult: Secondary | ICD-10-CM | POA: Diagnosis not present

## 2019-10-20 DIAGNOSIS — J45909 Unspecified asthma, uncomplicated: Secondary | ICD-10-CM | POA: Insufficient documentation

## 2019-10-20 DIAGNOSIS — Z113 Encounter for screening for infections with a predominantly sexual mode of transmission: Secondary | ICD-10-CM | POA: Diagnosis not present

## 2019-10-20 DIAGNOSIS — G43909 Migraine, unspecified, not intractable, without status migrainosus: Secondary | ICD-10-CM | POA: Insufficient documentation

## 2019-10-20 DIAGNOSIS — Z01419 Encounter for gynecological examination (general) (routine) without abnormal findings: Secondary | ICD-10-CM | POA: Diagnosis not present

## 2019-12-10 MED FILL — BLISOVI FE 1/20 1-20 MG-MCG: 1-20 | 84 days supply | Qty: 84 | Fill #0

## 2020-02-05 DIAGNOSIS — Z6829 Body mass index (BMI) 29.0-29.9, adult: Secondary | ICD-10-CM | POA: Diagnosis not present

## 2020-02-05 DIAGNOSIS — R519 Headache, unspecified: Secondary | ICD-10-CM | POA: Diagnosis not present

## 2020-02-05 DIAGNOSIS — F430 Acute stress reaction: Secondary | ICD-10-CM | POA: Diagnosis not present

## 2020-03-14 MED FILL — BLISOVI FE 1/20 1-20 MG-MCG: 1-20 | 84 days supply | Qty: 84 | Fill #0

## 2020-04-04 ENCOUNTER — Telehealth: Payer: 59 | Admitting: Family

## 2020-04-04 DIAGNOSIS — H109 Unspecified conjunctivitis: Secondary | ICD-10-CM

## 2020-04-04 MED ORDER — POLYMYXIN B-TRIMETHOPRIM 10000-0.1 UNIT/ML-% OP SOLN: 1.0000 [drp] | Freq: Four times a day (QID) | OPHTHALMIC | 0 refills | Status: DC

## 2020-04-04 NOTE — Progress Notes (Signed)
E-Visit for [employer]   We are sorry that you are not feeling well.  Here is how we plan to help!  Based on what you have shared with me it looks like you have conjunctivitis.  Conjunctivitis is a common inflammatory or infectious condition of the eye that is often referred to as "pink eye".  In most cases it is contagious (viral or bacterial). However, not all conjunctivitis requires antibiotics (ex. Allergic).  We have made appropriate suggestions for you based upon your presentation.  I have prescribed Polytrim Ophthalmic drops 1-2 drops 4 times a day times 5 days.   If your pain continues or worsens you need to be seen face to face.   Pink eye can be highly contagious.  It is typically spread through direct contact with secretions, or contaminated objects or surfaces that one may have touched.  Strict handwashing is suggested with soap and water is urged.  If not available, use alcohol based had sanitizer.  Avoid unnecessary touching of the eye.  If you wear contact lenses, you will need to refrain from wearing them until you see no white discharge from the eye for at least 24 hours after being on medication.  You should see symptom improvement in 1-2 days after starting the medication regimen.  Call us if symptoms are not improved in 1-2 days.  Home Care:  Wash your hands often!  Do not wear your contacts until you complete your treatment plan.  Avoid sharing towels, bed linen, personal items with a person who has pink eye.  See attention for anyone in your home with similar symptoms.  Get Help Right Away If:  Your symptoms do not improve.  You develop blurred or loss of vision.  Your symptoms worsen (increased discharge, pain or redness)  Your e-visit answers were reviewed by a board certified advanced clinical practitioner to complete your personal care plan.  Depending on the condition, your plan could have included both over the counter or prescription medications.  If there  is a problem please reply  once you have received a response from your provider.  Your safety is important to [place].  If you have drug allergies check your prescription carefully.    You can use MyChart to ask questions about today's visit, request a non-urgent call back, or ask for a work or school excuse for 24 hours related to this e-Visit. If it has been greater than 24 hours you will need to follow up with your provider, or enter a new e-Visit to address those concerns.   You will get an e-mail in the next two days asking about your experience.  I hope that your e-visit has been valuable and will speed your recovery. Thank you for using e-visits.   Approximately 5 minutes was spent documenting and reviewing patient's chart.

## 2020-04-15 DIAGNOSIS — H20011 Primary iridocyclitis, right eye: Secondary | ICD-10-CM | POA: Diagnosis not present

## 2020-04-15 MED FILL — PREDNISOLONE AC 1% EYE DROP: 1 | 50 days supply | Qty: 10 | Fill #0

## 2020-04-21 DIAGNOSIS — H2000 Unspecified acute and subacute iridocyclitis: Secondary | ICD-10-CM | POA: Diagnosis not present

## 2020-04-26 DIAGNOSIS — H2000 Unspecified acute and subacute iridocyclitis: Secondary | ICD-10-CM | POA: Diagnosis not present

## 2020-04-27 DIAGNOSIS — Z7689 Persons encountering health services in other specified circumstances: Secondary | ICD-10-CM | POA: Diagnosis not present

## 2020-05-12 DIAGNOSIS — F519 Sleep disorder not due to a substance or known physiological condition, unspecified: Secondary | ICD-10-CM | POA: Diagnosis not present

## 2020-05-12 DIAGNOSIS — Z6830 Body mass index (BMI) 30.0-30.9, adult: Secondary | ICD-10-CM | POA: Diagnosis not present

## 2020-05-12 DIAGNOSIS — F430 Acute stress reaction: Secondary | ICD-10-CM | POA: Diagnosis not present

## 2020-05-18 DIAGNOSIS — H2000 Unspecified acute and subacute iridocyclitis: Secondary | ICD-10-CM | POA: Diagnosis not present

## 2020-06-15 DIAGNOSIS — M791 Myalgia, unspecified site: Secondary | ICD-10-CM | POA: Diagnosis not present

## 2020-06-24 ENCOUNTER — Other Ambulatory Visit: Payer: Self-pay

## 2020-06-24 ENCOUNTER — Ambulatory Visit (INDEPENDENT_AMBULATORY_CARE_PROVIDER_SITE_OTHER): Payer: 59

## 2020-06-24 DIAGNOSIS — Z23 Encounter for immunization: Secondary | ICD-10-CM

## 2020-06-24 MED FILL — BLISOVI FE 1/20 1-20 MG-MCG: 1-20 | 84 days supply | Qty: 84 | Fill #1

## 2020-06-27 NOTE — Progress Notes (Signed)
   Covid-19 Vaccination Clinic  Name:  SIGNE TACKITT    MRN: [id number] DOB: Apr 09, 2001  06/27/2020  Ms. Whittier was observed post Covid-19 immunization for 15 minutes without incident. She was provided with Vaccine Information Sheet and instruction to access the V-Safe system.   Ms. Hofacker was instructed to call 911 with any severe reactions post vaccine: [name] Difficulty breathing  . Swelling of face and throat  . A fast heartbeat  . A bad rash all over body  . Dizziness and weakness

## 2020-06-29 ENCOUNTER — Telehealth: Payer: 59 | Admitting: Family

## 2020-06-29 ENCOUNTER — Other Ambulatory Visit: Payer: Self-pay | Admitting: Family

## 2020-06-29 DIAGNOSIS — J029 Acute pharyngitis, unspecified: Secondary | ICD-10-CM | POA: Diagnosis not present

## 2020-06-29 MED ORDER — AMOXICILLIN 500 MG PO CAPS: 500.0000 mg | ORAL_CAPSULE | Freq: Two times a day (BID) | ORAL | 0 refills | Status: DC

## 2020-06-29 MED FILL — AMOXICILLIN 500 MG CAPSULE: 500 | 10 days supply | Qty: 20 | Fill #0

## 2020-06-29 NOTE — Progress Notes (Signed)

## 2020-08-10 ENCOUNTER — Other Ambulatory Visit: Payer: Self-pay

## 2020-08-10 ENCOUNTER — Ambulatory Visit (INDEPENDENT_AMBULATORY_CARE_PROVIDER_SITE_OTHER): Payer: 59 | Admitting: *Deleted

## 2020-08-10 DIAGNOSIS — Z20828 Contact with and (suspected) exposure to other viral communicable diseases: Secondary | ICD-10-CM | POA: Diagnosis not present

## 2020-08-10 DIAGNOSIS — Z23 Encounter for immunization: Secondary | ICD-10-CM | POA: Diagnosis not present

## 2020-09-13 MED FILL — BLISOVI FE 1/20 1-20 MG-MCG: 1-20 | 84 days supply | Qty: 84 | Fill #2

## 2020-10-28 ENCOUNTER — Other Ambulatory Visit (HOSPITAL_COMMUNITY): Payer: Self-pay | Admitting: Radiology

## 2020-10-28 DIAGNOSIS — Z113 Encounter for screening for infections with a predominantly sexual mode of transmission: Secondary | ICD-10-CM | POA: Diagnosis not present

## 2020-10-28 DIAGNOSIS — Z3040 Encounter for surveillance of contraceptives, unspecified: Secondary | ICD-10-CM | POA: Diagnosis not present

## 2020-10-28 DIAGNOSIS — Z01419 Encounter for gynecological examination (general) (routine) without abnormal findings: Secondary | ICD-10-CM | POA: Diagnosis not present

## 2020-10-28 DIAGNOSIS — Z6832 Body mass index (BMI) 32.0-32.9, adult: Secondary | ICD-10-CM | POA: Diagnosis not present

## 2020-11-09 DIAGNOSIS — M79605 Pain in left leg: Secondary | ICD-10-CM | POA: Diagnosis not present

## 2020-11-09 DIAGNOSIS — R202 Paresthesia of skin: Secondary | ICD-10-CM | POA: Diagnosis not present

## 2020-11-09 DIAGNOSIS — M2550 Pain in unspecified joint: Secondary | ICD-10-CM | POA: Diagnosis not present

## 2020-11-09 DIAGNOSIS — Z6833 Body mass index (BMI) 33.0-33.9, adult: Secondary | ICD-10-CM | POA: Diagnosis not present

## 2020-11-18 ENCOUNTER — Other Ambulatory Visit (HOSPITAL_COMMUNITY): Payer: Self-pay | Admitting: Family Medicine

## 2020-11-18 MED FILL — ADVAIR 250/50 DISKUS: 250-50 | 30 days supply | Qty: 60 | Fill #0

## 2020-12-04 ENCOUNTER — Telehealth: Payer: 59 | Admitting: Physician Assistant

## 2020-12-04 DIAGNOSIS — J40 Bronchitis, not specified as acute or chronic: Secondary | ICD-10-CM | POA: Diagnosis not present

## 2020-12-04 DIAGNOSIS — R059 Cough, unspecified: Secondary | ICD-10-CM

## 2020-12-04 MED ORDER — BENZONATATE 100 MG PO CAPS: 100.0000 mg | ORAL_CAPSULE | Freq: Three times a day (TID) | ORAL | 0 refills | Status: DC | PRN

## 2020-12-04 MED ORDER — AZITHROMYCIN 250 MG PO TABS: ORAL_TABLET | ORAL | 0 refills | Status: DC

## 2020-12-04 MED ORDER — PREDNISONE 10 MG PO TABS: ORAL_TABLET | ORAL | 0 refills | Status: DC

## 2020-12-04 NOTE — Progress Notes (Signed)
Virtual Visit via Video   I connected with patient on 12/04/20 at  5:30 PM EDT by a video enabled telemedicine application and verified that I am speaking with the correct person using two identifiers.  Location patient: Home Location provider: [place] participating in the virtual visit: Patient, Provider  I discussed the limitations of evaluation and management by telemedicine and the availability of in person appointments. The patient expressed understanding and agreed to proceed.  Subjective:   HPI:  Patient presents via [place] today c/o congestion and asthma flair up continuing after taking all asthma protocols and increase of allergens around H/o asthma and allergies. Endorses cough and congestion x several weeks. She cannot get rid of cough.  She has been using Advair, Zyrtec, Nasal saline. She has used three neb treatments in the past week.   She has not needed her Albuterol inhaler.   Denies fever, chills, wheezing, chest pain.  She is not waking up at night coughing or wheezing.  She has taken Prednisone in the past.  She has been taking Prednisone for this problem: she has been taking 20 mg qhs for 4 days -- this was a sample pack she received from her allergy and Asthma specialist a while ago.  She has seen allergy and asthma specialist in the past.   ROS:   See pertinent positives and negatives per HPI.  There are no problems to display for this patient.   Social History   Tobacco Use  . Smoking status: Never Smoker  . Smokeless tobacco: Never Used  Substance Use Topics  . Alcohol use: Not Currently    Current Outpatient Medications:  .  adapalene (DIFFERIN) 0.1 % cream, APPLY PEA SIZED AMOUNT TO FINGER AND SPREAD THIN LAYER TO ACNE AREAS ONCE A DAY IN MORNING, Disp: , Rfl:  .  albuterol (PROVENTIL HFA;VENTOLIN HFA) 108 (90 Base) MCG/ACT inhaler, Inhale 2 puffs 15 minutes prior to exercise and every 4 hours prn cough, wheeze with  asthma flare, Disp: , Rfl:  .  amoxicillin (AMOXIL) 500 MG capsule, Take 1 capsule (500 mg total) by mouth 2 (two) times daily., Disp: 20 capsule, Rfl: 0 .  citalopram (CELEXA) 10 MG tablet, Take 10 mg by mouth every morning., Disp: , Rfl: 2 .  cyclobenzaprine (FLEXERIL) 10 MG tablet, Take 1 tablet (10 mg total) by mouth 3 (three) times daily as needed for muscle spasms., Disp: 30 tablet, Rfl: 1 .  diclofenac sodium (VOLTAREN) 1 % GEL, Apply to large joint area up to three times a day as needed, Disp: 2 Tube, Rfl: 1 .  Fluticasone-Salmeterol (ADVAIR) 100-50 MCG/DOSE AEPB, Inhale into the lungs., Disp: , Rfl:  .  LARIN FE 1/20 1-20 MG-MCG tablet, Take 1 tablet by mouth daily., Disp: , Rfl: 4 .  naproxen (NAPROSYN) 500 MG tablet, Take 1 tablet (500 mg total) by mouth 2 (two) times daily with a meal., Disp: 60 tablet, Rfl: 1 .  tiZANidine (ZANAFLEX) 2 MG tablet, Take 1-2 tablets (2-4 mg total) by mouth every 6 (six) hours as needed for muscle spasms., Disp: 60 tablet, Rfl: 1 .  trimethoprim-polymyxin b (POLYTRIM) ophthalmic solution, Place 1 drop into the left eye every 6 (six) hours., Disp: 10 mL, Rfl: 0  No Known Allergies  Objective:   There were no vitals taken for this visit.  Patient is well-developed, well-nourished in no acute distress.  Resting comfortably at home.  Head is normocephalic, atraumatic.  No labored breathing, no  cough or wheezing. Speaking in clear sentences. Speech is clear and coherent with logical content.  Patient is alert and oriented at baseline.   Assessment and Plan:   Bronchitis.  Treatment Plan:  Azithromyin 250 mg: two tablets now and then one tablet daily for 4 additonal days   Tessalon Perles 100mg . You may take 1-2 capsules every 8 hours as needed for your cough. Prednisone 10mg  taper: Day 1: 2 tablets before breakfast, 1 after both lunch & dinner and 2 at bedtime Day 2: 1 tab before breakfast, 1 after both lunch & dinner and 2 at bedtime Day 3: 1 tab  at each meal & 1 at bedtime Day 4: 1 tab at breakfast, 1 at lunch, 1 at bedtime Day 5: 1 tab at breakfast & 1 tab at bedtime Day 6: 1 tab at breakfast   Discussed home care tips:  . Only take medications as instructed by your medical team. . Complete the entire course of an antibiotic. . Drink plenty of fluids and get plenty of rest. . Avoid close contacts especially the very young and the elderly . Cover your mouth if you cough or cough into your sleeve. . Always remember to wash your hands . A steam or ultrasonic humidifier can help congestion.     ELoree Fee Brantley Wiley, PA-C [facility] 12/04/2020    Greater than 5 minutes, yet less than 10 minutes of time have been spent researching, coordinating and implementing care for this patient today.

## 2020-12-09 MED FILL — BLISOVI FE 1/20 1-20 MG-MCG: 1-20 | 84 days supply | Qty: 84 | Fill #0

## 2021-01-01 ENCOUNTER — Ambulatory Visit: Payer: Self-pay

## 2021-01-01 ENCOUNTER — Emergency Department (HOSPITAL_BASED_OUTPATIENT_CLINIC_OR_DEPARTMENT_OTHER): Payer: 59 | Admitting: Radiology

## 2021-01-01 ENCOUNTER — Other Ambulatory Visit: Payer: Self-pay

## 2021-01-01 ENCOUNTER — Encounter (HOSPITAL_BASED_OUTPATIENT_CLINIC_OR_DEPARTMENT_OTHER): Payer: Self-pay

## 2021-01-01 ENCOUNTER — Telehealth: Payer: Self-pay | Admitting: Emergency Medicine

## 2021-01-01 ENCOUNTER — Emergency Department (HOSPITAL_BASED_OUTPATIENT_CLINIC_OR_DEPARTMENT_OTHER)
Admission: EM | Admit: 2021-01-01 | Discharge: 2021-01-01 | Disposition: A | Discharge status: Home / Self Care | Payer: 59 | Attending: Emergency Medicine | Admitting: Emergency Medicine

## 2021-01-01 DIAGNOSIS — M6281 Muscle weakness (generalized): Secondary | ICD-10-CM | POA: Diagnosis not present

## 2021-01-01 DIAGNOSIS — M419 Scoliosis, unspecified: Secondary | ICD-10-CM | POA: Diagnosis not present

## 2021-01-01 DIAGNOSIS — Z77120 Contact with and (suspected) exposure to mold (toxic): Secondary | ICD-10-CM | POA: Insufficient documentation

## 2021-01-01 DIAGNOSIS — J45909 Unspecified asthma, uncomplicated: Secondary | ICD-10-CM | POA: Diagnosis not present

## 2021-01-01 DIAGNOSIS — R0602 Shortness of breath: Secondary | ICD-10-CM | POA: Diagnosis not present

## 2021-01-01 DIAGNOSIS — Z8669 Personal history of other diseases of the nervous system and sense organs: Secondary | ICD-10-CM | POA: Insufficient documentation

## 2021-01-01 DIAGNOSIS — M4126 Other idiopathic scoliosis, lumbar region: Secondary | ICD-10-CM | POA: Diagnosis not present

## 2021-01-01 DIAGNOSIS — R202 Paresthesia of skin: Secondary | ICD-10-CM | POA: Insufficient documentation

## 2021-01-01 LAB — CK: Total CK: 62 U/L (ref 38–234)

## 2021-01-01 LAB — COMPREHENSIVE METABOLIC PANEL
ALT: 10 U/L (ref 0–44)
AST: 13 U/L — ABNORMAL LOW (ref 15–41)
Albumin: 4.1 g/dL (ref 3.5–5.0)
Alkaline Phosphatase: 54 U/L (ref 38–126)
Anion gap: 8 (ref 5–15)
BUN: 8 mg/dL (ref 6–20)
CO2: 27 mmol/L (ref 22–32)
Calcium: 9 mg/dL (ref 8.9–10.3)
Chloride: 104 mmol/L (ref 98–111)
Creatinine, Ser: 0.71 mg/dL (ref 0.44–1.00)
GFR, Estimated: >60 mL/min (ref >60)
Glucose, Bld: 88 mg/dL (ref 70–99)
Potassium: 3.6 mmol/L (ref 3.5–5.1)
Sodium: 139 mmol/L (ref 135–145)
Total Bilirubin: 0.6 mg/dL (ref 0.3–1.2)
Total Protein: 6.7 g/dL (ref 6.5–8.1)

## 2021-01-01 LAB — SEDIMENTATION RATE: Sed Rate: 1 mm/hr (ref 0–22)

## 2021-01-01 LAB — CBC WITH DIFFERENTIAL/PLATELET
Abs Immature Granulocytes: 0.01 10*3/uL (ref 0.00–0.07)
Basophils Absolute: 0 10*3/uL (ref 0.0–0.1)
Basophils Relative: 1 %
Eosinophils Absolute: 0.1 10*3/uL (ref 0.0–0.5)
Eosinophils Relative: 1 %
HCT: 37.2 % (ref 36.0–46.0)
Hemoglobin: 13.1 g/dL (ref 12.0–15.0)
Immature Granulocytes: 0 %
Lymphocytes Relative: 46 %
Lymphs Abs: 2.7 10*3/uL (ref 0.7–4.0)
MCH: 31.8 pg (ref 26.0–34.0)
MCHC: 35.2 g/dL (ref 30.0–36.0)
MCV: 90.3 fL (ref 80.0–100.0)
Monocytes Absolute: 0.4 10*3/uL (ref 0.1–1.0)
Monocytes Relative: 7 %
Neutro Abs: 2.6 10*3/uL (ref 1.7–7.7)
Neutrophils Relative %: 45 %
Platelets: 258 10*3/uL (ref 150–400)
RBC: 4.12 MIL/uL (ref 3.87–5.11)
RDW: 12.5 % (ref 11.5–15.5)
WBC: 5.8 10*3/uL (ref 4.0–10.5)
nRBC: 0 % (ref 0.0–0.2)

## 2021-01-01 LAB — VITAMIN B12: Vitamin B-12: 173 pg/mL — ABNORMAL LOW (ref 180–914)

## 2021-01-01 LAB — TSH: TSH: 2.937 u[IU]/mL (ref 0.350–4.500)

## 2021-01-01 LAB — C-REACTIVE PROTEIN: CRP: <0.5 mg/dL (ref <1.0)

## 2021-01-01 LAB — FOLATE: Folate: 10.4 ng/mL (ref >5.9)

## 2021-01-01 NOTE — ED Notes (Signed)
Pt up to restroom with steady gait.

## 2021-01-01 NOTE — ED Provider Notes (Signed)
[facility] EMERGENCY DEPT Provider Note   CSN: [id number] Arrival date & time: 01/01/21  6010     History Chief Complaint  Patient presents with  . Chest Pain  . Ashley Reed is a 20 y.o. female.  Ashley Reed presents for evaluation of possible mold toxicity.  She is attributing a variety of physical symptoms to a reaction to household mold.  She is living [place], and there is visible mold on multiple surfaces.  Her lease is not up for another 3 months.  She does take her nasal steroid and uses Advair as needed.  She has a known history of asthma with an allergy/immunology visit about 2 years ago during which time she tested positive for a dust allergy.  However, she complains of paresthesias from her thorax all the way down to her legs that has been ongoing for about 6 days.  This is her second episode of this problem.  She notes that the problem is worse in the morning, and she has feelings of coldness and burning in her legs and even vaginal area.  This sensation makes it a little bit hard to walk, and she does notice some weakness at times especially in the first few hours of the morning during which time she has difficulty getting up out of the chair.  She has seen her primary care doctor, and she had negative lumbar spine x-rays in February of this year.  The history is provided by the patient.  Neurologic Problem This is a recurrent problem. Episode onset: 6 days ago this episode started; had an episode 2 months ago that resolved. The problem occurs constantly. The problem has not changed since onset.Associated symptoms include shortness of breath (from moldy home; increased use of albuterol). Pertinent negatives include no chest pain, no abdominal pain and no headaches. Nothing aggravates the symptoms. Nothing relieves the symptoms. She has tried nothing for the symptoms. The treatment provided no relief.       Past  Medical History:  Diagnosis Date  . Asthma     There are no problems to display for this patient.   Past Surgical History:  Procedure Laterality Date  . BREAST REDUCTION SURGERY       OB History   No obstetric history on file.     No family history on file.  Social History   Tobacco Use  . Smoking status: Never Smoker  . Smokeless tobacco: Never Used  Vaping Use  . Vaping Use: Never used  Substance Use Topics  . Alcohol use: Not Currently  . Drug use: Not Currently    Home Medications Prior to Admission medications   Medication Sig Start Date End Date Taking? Authorizing Provider  adapalene (DIFFERIN) 0.1 % cream APPLY PEA SIZED AMOUNT TO FINGER AND SPREAD THIN LAYER TO ACNE AREAS ONCE A DAY IN MORNING 10/18/16   [provider]  albuterol (PROVENTIL HFA;VENTOLIN HFA) 108 (90 Base) MCG/ACT inhaler Inhale 2 puffs 15 minutes prior to exercise and every 4 hours prn cough, wheeze with asthma flare 04/23/16   [provider]  amoxicillin (AMOXIL) 500 MG capsule TAKE 1 CAPSULE (500 MG TOTAL) BY MOUTH 2 (TWO) TIMES DAILY. 06/29/20 06/29/21  Sharion Balloon, FNP  azithromycin (ZITHROMAX) 250 MG tablet Take 2 tabs PO x 1 dose, then 1 tab PO QD x 4 days 12/04/20   McVey, Gelene Mink, PA-C  benzonatate (TESSALON) 100 MG capsule  Take 1-2 capsules (100-200 mg total) by mouth 3 (three) times daily as needed for cough. 12/04/20   McVey, Gelene Mink, PA-C  citalopram (CELEXA) 10 MG tablet Take 10 mg by mouth every morning. 01/17/18   [provider]  cyclobenzaprine (FLEXERIL) 10 MG tablet Take 1 tablet (10 mg total) by mouth 3 (three) times daily as needed for muscle spasms. 07/21/18   Hassell Done Mary-Margaret, FNP  diclofenac sodium (VOLTAREN) 1 % GEL Apply to large joint area up to three times a day as needed 02/20/18   Garald Balding, MD  Fluticasone-Salmeterol (ADVAIR) 100-50 MCG/DOSE AEPB Inhale into the lungs. 04/23/16   [provider]   Fluticasone-Salmeterol (ADVAIR) 250-50 MCG/DOSE AEPB INHALE 1 PUFF AS DIRECTED TWICE A DAY 11/18/20 11/18/21  Carolann Littler, MD  LARIN FE 1/20 1-20 MG-MCG tablet Take 1 tablet by mouth daily. 02/05/18   [provider]  naproxen (NAPROSYN) 500 MG tablet Take 1 tablet (500 mg total) by mouth 2 (two) times daily with a meal. 07/21/18   Hassell Done, Mary-Margaret, FNP  norethindrone-ethinyl estradiol (LOESTRIN FE) 1-20 MG-MCG tablet TAKE 1 TABLET BY MOUTH ONCE DAILY. 10/28/20 10/28/21  Chrzanowski, Wende Crease B, NP  predniSONE (DELTASONE) 10 MG tablet Day 1: 2 tablets before breakfast, 1 after both lunch & dinner and 2 at bedtime; Day 2: 1 tab before breakfast, 1 after both lunch & dinner and 2 at bedtime; Day 3: 1 tab at each meal & 1 at bedtime; Day 4: 1 tab at breakfast, 1 at lunch, 1 at bedtime; Day 5: 1 tab at breakfast & 1 tab at bedtime; Day 6: 1 tab at breakfast 12/04/20   McVey, Gelene Mink, PA-C  tiZANidine (ZANAFLEX) 2 MG tablet Take 1-2 tablets (2-4 mg total) by mouth every 6 (six) hours as needed for muscle spasms. 08/12/18   Hilts, Legrand Como, MD  trimethoprim-polymyxin b (POLYTRIM) ophthalmic solution Place 1 drop into the left eye every 6 (six) hours. 04/04/20   Sharion Balloon, FNP    Allergies    Patient has no known allergies.  Review of Systems   Review of Systems  Constitutional: Negative for activity change, appetite change, chills, fever and unexpected weight change.       Intentional weight loss due to vegetarian diet change  HENT: Negative for congestion, ear pain and sore throat.   Eyes: Positive for visual disturbance. Negative for pain.       Had iritis several months ago Intermittent blurred vision  Respiratory: Positive for cough, shortness of breath (from moldy home; increased use of albuterol) and wheezing.   Cardiovascular: Negative for chest pain, palpitations and leg swelling.  Gastrointestinal: Negative for abdominal pain and vomiting.  Genitourinary: Negative for  dysuria and hematuria.  Musculoskeletal: Negative for arthralgias, back pain and joint swelling.  Skin: Negative for color change and rash.  Neurological: Negative for seizures, syncope and headaches.  All other systems reviewed and are negative.   Physical Exam Updated Vital Signs BP (!) 141/94 (BP Location: Right Arm)   Pulse (!) 103   Temp 98.2 F (36.8 C) (Oral)   Resp 20   Ht 5' 8.5" (1.74 m)   Wt 96.6 kg   LMP 12/11/2020 (Approximate)   SpO2 99%   BMI 31.92 kg/m   Physical Exam Vitals and nursing note reviewed.  Constitutional:      General: She is not in acute distress.    Appearance: She is well-developed. She is not ill-appearing.  HENT:  Head: Normocephalic and atraumatic.  Eyes:     Conjunctiva/sclera: Conjunctivae normal.  Cardiovascular:     Rate and Rhythm: Normal rate and regular rhythm.     Heart sounds: Normal heart sounds. No murmur heard.   Pulmonary:     Effort: Pulmonary effort is normal. No respiratory distress.     Breath sounds: Normal breath sounds.  Chest:     Chest wall: No deformity.  Abdominal:     Palpations: Abdomen is soft.     Tenderness: There is no abdominal tenderness.  Musculoskeletal:     Cervical back: Neck supple.     Right lower leg: No tenderness. No edema.     Left lower leg: No tenderness. No edema.  Skin:    General: Skin is warm and dry.     Capillary Refill: Capillary refill takes less than 2 seconds.  Neurological:     General: No focal deficit present.     Mental Status: She is alert and oriented to person, place, and time.     Deep Tendon Reflexes:     Reflex Scores:      Bicep reflexes are 1+ on the right side and 1+ on the left side.      Brachioradialis reflexes are 1+ on the right side and 1+ on the left side.      Patellar reflexes are 2+ on the right side and 2+ on the left side.      Achilles reflexes are 2+ on the right side and 2+ on the left side.    Comments: The patient has intact sensation to  light touch, but she does state that her legs feel slightly different when touched.  She has relatively normal strength throughout, but I did notice some muscle fasciculation in her quadriceps when asked to hold her legs up off the bed for longer than 10 seconds.  She did have a normal gait.  Psychiatric:        Mood and Affect: Mood normal.        Behavior: Behavior normal.     ED Results / Procedures / Treatments   Labs (all labs ordered are listed, but only abnormal results are displayed) Labs Reviewed  COMPREHENSIVE METABOLIC PANEL - Abnormal; Notable for the following components:      Result Value   AST 13 (*)    All other components within normal limits  CBC WITH DIFFERENTIAL/PLATELET  CK  SEDIMENTATION RATE  TSH  C-REACTIVE PROTEIN  VITAMIN B12  FOLATE    EKG EKG Interpretation  Date/Time:  Sunday January 01 2021 09:24:29 EDT Ventricular Rate:  84 PR Interval:  141 QRS Duration: 99 QT Interval:  381 QTC Calculation: 451 R Axis:   68 Text Interpretation: Sinus rhythm normalaxis no acute ischemia Confirmed by Lorre Munroe ([id number]) on 01/01/2021 9:28:24 AM   Radiology DG Thoracic Spine 2 View  Result Date: 01/01/2021 CLINICAL DATA:  Bilateral leg numbness. EXAM: THORACIC SPINE 2 VIEWS COMPARISON:  None. FINDINGS: Minimal levoscoliosis of the upper thoracic spine. No evidence of acute vertebral body subluxation. No fracture line or displaced fracture fragment. No acute-appearing cortical irregularity or osseous lesion. No vertebral body compression deformity. No significant degenerative change is seen. Visualized paravertebral soft tissues are unremarkable. IMPRESSION: Negative. Electronically Signed   By: Franki Cabot M.D.   On: 01/01/2021 10:49   DG Lumbar Spine Complete  Result Date: 01/01/2021 CLINICAL DATA:  Bilateral leg numbness. EXAM: LUMBAR SPINE - COMPLETE 4+ VIEW COMPARISON:  Plain film  of the lumbar spine dated 09/18/2018 FINDINGS: Minimal dextroscoliosis of  the upper lumbar spine, stable. No evidence of acute vertebral body subluxation. No fracture line or displaced fracture fragment. No vertebral body compression deformity. No acute-appearing cortical irregularity or osseous lesion. No significant degenerative change seen. No evidence of pars interarticularis defect. Visualized paravertebral soft tissues are unremarkable. IMPRESSION: 1. No acute findings. 2. Stable minimal scoliosis of the upper lumbar spine. 3. No significant degenerative change. Electronically Signed   By: Franki Cabot M.D.   On: 01/01/2021 10:38    Procedures Procedures   Medications Ordered in ED Medications - No data to display  ED Course  I have reviewed the triage vital signs and the nursing notes.  Pertinent labs & imaging results that were available during my care of the patient were reviewed by me and considered in my medical decision making (see chart for details).    MDM Rules/Calculators/A&P                          [place] presents to the ED with a variety of complaints, most specifically, burning and numbness from her mid thorax down to her toes.  This has been ongoing for almost a week, and is recurrent.  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  I am concerned about a possible inflammatory etiology of her symptoms such as an autoimmune condition causing transverse myelitis.  Based on her exam and the chronicity of her symptoms, I do think it is appropriate for her to seek outpatient diagnostic work-up for this condition.  MRI is not currently available at this facility, and her neurologic exam is overall reassuring.  Nonetheless, I counseled her that she will need careful and close follow-up.  I do think MRI of the thoracic and lumbar spine is the next best test, and neurology referral could help facilitate these tests.  EMG or other nerve conduction test might be necessary as well.  Finally, there  is a concern for possible autoimmune condition or rheumatologic condition.  She may need further testing for an underlying cause of her neurologic condition.  I did evaluate her for obvious metabolic, endocrinologic, toxicologic condition that could contribute to peripheral neuropathy.  Nonetheless, I do think she has almost a spinal level to her symptoms, and I think transverse myelitis would make the most sense.  As far as her mold exposure, we talked about symptom management.  I recommended that she leave her moldy environment as soon as practically able.  Continue to use allergy and asthma meds as needed. Final Clinical Impression(s) / ED Diagnoses Final diagnoses:  Paresthesias  Proximal muscle weakness  History of iritis  Contact with and (suspected) exposure to mold (toxic)    Rx / DC Orders ED Discharge Orders    None       Arnaldo Natal, MD 01/01/21 1303

## 2021-01-01 NOTE — Telephone Encounter (Signed)
Call to [facility] to let her know that we do not do mold toxicity testing at [facility] . Directed Shel[place] to follow up w/ her PCP or the ER for numbness in her legs - no ability to scan today. Provider here today verbally updated on pt.

## 2021-01-01 NOTE — ED Notes (Signed)
Gave pt ice water, per Dr. Joya Gaskins.

## 2021-01-01 NOTE — ED Notes (Signed)
Pt discharged home after verbalizing understanding of discharge instructions; nad noted. 

## 2021-01-01 NOTE — ED Triage Notes (Signed)
Pt via pov from home with chest pain and numbess x several months intermittently. Pt states it starts in her legs and "works its way up" and is owrse when she awakens in the morning. She also reports that she has hx of asthma, and that there is mold in her house, which makes her asthma worse. Pt alert & oriented, nad noted.

## 2021-01-01 NOTE — ED Triage Notes (Signed)
She c/o living in an abode with much mold growing in it. She c/o exacerbation of asthma; headaches and widespread paresthesias and dysthesias. She is in no distress and is ambulatory.

## 2021-01-01 NOTE — Discharge Instructions (Addendum)
I am concerned that you may have transverse myelitis or other inflammatory condition.  I think that you need an MRI of the thoracic and lumbar spine.  Please ask your primary care doctor for a referral to neurology for further evaluation and diagnostic testing.  If you develop any new neurologic symptoms, especially weakness of the legs, incontinence of urine, constipation, or urinary retention, please seek emergency care immediately.  You were tested today for any metabolic, endocrinologic, or obvious infectious cause of your symptoms.  Based on the findings of your neurologic evaluation, you may need further rheumatologic testing.

## 2021-01-02 ENCOUNTER — Ambulatory Visit: Payer: Self-pay

## 2021-01-04 DIAGNOSIS — M2550 Pain in unspecified joint: Secondary | ICD-10-CM | POA: Diagnosis not present

## 2021-01-04 DIAGNOSIS — R202 Paresthesia of skin: Secondary | ICD-10-CM | POA: Diagnosis not present

## 2021-01-04 DIAGNOSIS — Z6833 Body mass index (BMI) 33.0-33.9, adult: Secondary | ICD-10-CM | POA: Diagnosis not present

## 2021-01-06 DIAGNOSIS — G43001 Migraine without aura, not intractable, with status migrainosus: Secondary | ICD-10-CM | POA: Diagnosis not present

## 2021-01-06 DIAGNOSIS — G8220 Paraplegia, unspecified: Secondary | ICD-10-CM | POA: Diagnosis not present

## 2021-01-06 DIAGNOSIS — R200 Anesthesia of skin: Secondary | ICD-10-CM | POA: Diagnosis not present

## 2021-01-06 DIAGNOSIS — M79605 Pain in left leg: Secondary | ICD-10-CM | POA: Diagnosis not present

## 2021-01-06 DIAGNOSIS — R9082 White matter disease, unspecified: Secondary | ICD-10-CM | POA: Diagnosis not present

## 2021-01-06 DIAGNOSIS — J45909 Unspecified asthma, uncomplicated: Secondary | ICD-10-CM | POA: Diagnosis not present

## 2021-01-06 DIAGNOSIS — G35 Multiple sclerosis: Secondary | ICD-10-CM | POA: Diagnosis not present

## 2021-01-06 DIAGNOSIS — M546 Pain in thoracic spine: Secondary | ICD-10-CM | POA: Diagnosis not present

## 2021-01-06 DIAGNOSIS — G373 Acute transverse myelitis in demyelinating disease of central nervous system: Secondary | ICD-10-CM | POA: Diagnosis not present

## 2021-01-06 DIAGNOSIS — R339 Retention of urine, unspecified: Secondary | ICD-10-CM | POA: Diagnosis not present

## 2021-01-06 DIAGNOSIS — G379 Demyelinating disease of central nervous system, unspecified: Secondary | ICD-10-CM | POA: Diagnosis not present

## 2021-01-06 DIAGNOSIS — M79604 Pain in right leg: Secondary | ICD-10-CM | POA: Diagnosis not present

## 2021-01-06 DIAGNOSIS — R202 Paresthesia of skin: Secondary | ICD-10-CM | POA: Diagnosis not present

## 2021-01-06 DIAGNOSIS — M5127 Other intervertebral disc displacement, lumbosacral region: Secondary | ICD-10-CM | POA: Diagnosis not present

## 2021-01-06 DIAGNOSIS — R21 Rash and other nonspecific skin eruption: Secondary | ICD-10-CM | POA: Diagnosis not present

## 2021-01-06 DIAGNOSIS — M6281 Muscle weakness (generalized): Secondary | ICD-10-CM | POA: Diagnosis not present

## 2021-01-06 DIAGNOSIS — G43909 Migraine, unspecified, not intractable, without status migrainosus: Secondary | ICD-10-CM | POA: Diagnosis not present

## 2021-01-06 DIAGNOSIS — J4520 Mild intermittent asthma, uncomplicated: Secondary | ICD-10-CM | POA: Diagnosis not present

## 2021-01-06 DIAGNOSIS — M48061 Spinal stenosis, lumbar region without neurogenic claudication: Secondary | ICD-10-CM | POA: Diagnosis not present

## 2021-01-07 DIAGNOSIS — G35 Multiple sclerosis: Secondary | ICD-10-CM

## 2021-01-07 HISTORY — DX: Multiple sclerosis: G35

## 2021-01-10 DIAGNOSIS — G35 Multiple sclerosis: Secondary | ICD-10-CM | POA: Diagnosis not present

## 2021-01-11 ENCOUNTER — Other Ambulatory Visit: Payer: Self-pay | Admitting: *Deleted

## 2021-01-11 DIAGNOSIS — G35 Multiple sclerosis: Secondary | ICD-10-CM | POA: Diagnosis not present

## 2021-01-11 NOTE — Patient Outreach (Signed)
[place] [facility]) Care Management  01/11/2021  Ashley Reed 05/15/2001 [id number]   Transition of care telephone call  Referral received:01/09/21 Initial outreach:01/11/21 Insurance: [facility]   Initial unsuccessful telephone call to patient's preferred number in order to complete transition of care assessment; no answer, left HIPAA compliant voicemail message requesting return call.   Objective:Per electronic medical record Ashley Reed  was hospitalized at [facility] 4/22-4/25/22 for progressive muscle weakness, multiple sclerosis , progressive paraparesis, T4 sensory level. History of Migraine.   She  was discharged to home on 01/09/21  without the need for home health services and with rolling walker . Patient with scheduled outpatient infusion appointment for Solumedrol #4 and 5.    Plan: This RNCM will route unsuccessful outreach letter with [place] Management pamphlet and 24 hour Nurse Advice Line Magnet to [place] Management clinical pool to be mailed to patient's home address. This RNCM will attempt another outreach within 4 business days.  Joylene Draft, RN, BSN  [place] Management Coordinator  [phone]- Mobile [phone]- Toll Free Main Office

## 2021-01-14 DIAGNOSIS — Z7409 Other reduced mobility: Secondary | ICD-10-CM | POA: Diagnosis not present

## 2021-01-14 DIAGNOSIS — M6281 Muscle weakness (generalized): Secondary | ICD-10-CM | POA: Diagnosis not present

## 2021-01-14 DIAGNOSIS — R2689 Other abnormalities of gait and mobility: Secondary | ICD-10-CM | POA: Diagnosis not present

## 2021-01-16 ENCOUNTER — Other Ambulatory Visit: Payer: Self-pay | Admitting: *Deleted

## 2021-01-16 NOTE — Patient Outreach (Signed)
[place] [facility]) Care Management  01/16/2021  Ashley Reed 06-Jan-2001 [id number]   Transition of care call Referral received: 01/09/21 Initial outreach attempt: 01/11/21 Insurance: [employer]   2nd unsuccessful telephone call to patient's preferred contact number in order to complete post hospital discharge transition of care assessment ,introduced self, person answering phone identified as [facility], she requested that she return call at another time.  Objective: Per electronic medical record Ashley Reed was hospitalized [facility] 4/22-4/25/22 for progressive muscle weakness, multiple sclerosis , progressive paraparesis, T4 sensory level. History of Migraine.   She  was discharged to home on 01/09/21  without the need for home health services and with rolling walker . Patient with scheduled outpatient infusion appointment for Solumedrol #4 and 5.    Plan If no return call from patient will attempt 3rd outreach in the next 4 business days.    Joylene Draft, RN, BSN  [place] Management Coordinator  [phone]- Mobile [phone]- Toll Free Main Office

## 2021-01-19 ENCOUNTER — Other Ambulatory Visit: Payer: Self-pay | Admitting: *Deleted

## 2021-01-19 NOTE — Patient Outreach (Signed)
[place] [facility]) Care Management  01/19/2021  Ashley Reed [birth year] [id number]   Transition of care call  Referral received: 01/09/21 Initial outreach attempt: 01/11/21 Insurance: [employer] unsuccessful telephone call to patient's preferred contact number in order to complete post hospital discharge transition of care assessment; no answer, left HIPAA compliant message requesting return call.   Objective: Per electronic medical record Ashley Reed hospitalized [facility] 4/22-4/25/22 for progressive muscle weakness, multiple sclerosis , progressive paraparesis, T4 sensory level. History of Migraine.    Plan: If no return call from patient, will plan return call in the next 3 weeks.   Joylene Draft, RN, BSN  [place] Management Coordinator  [phone]- Mobile [phone]- Toll Free Main Office

## 2021-01-30 DIAGNOSIS — E538 Deficiency of other specified B group vitamins: Secondary | ICD-10-CM | POA: Diagnosis not present

## 2021-01-30 DIAGNOSIS — G35 Multiple sclerosis: Secondary | ICD-10-CM | POA: Diagnosis not present

## 2021-02-09 ENCOUNTER — Other Ambulatory Visit: Payer: Self-pay | Admitting: *Deleted

## 2021-02-09 NOTE — Patient Outreach (Signed)
[place] [facility]) Care Management  02/09/2021  Ashley Reed [birth year] [id number]   Transition of care /Case Closure Unsuccessful outreach    Referral received:01/09/21 Initial outreach:01/11/21 Insurance: [employer]    Unable to complete post hospital discharge transition of care assessment. No return call form patient after 3 call attempts and no response to request to contact RN Care Coordinator in unsuccessful outreach letter mailed to home on 01/11/21.  Objective: Per electronic medical record Ashley Reed hospitalized [facility] 4/22-4/25/22 for progressive muscle weakness, multiple sclerosis , progressive paraparesis, T4 sensory level. History of Migraine.  Plan Case closed to [employer] , unsuccessful outreach x 4 attempts .    Joylene Draft, RN, BSN  [place] Management Coordinator  [phone]- Mobile [phone]- Toll Free Main Office

## 2021-02-10 DIAGNOSIS — G35 Multiple sclerosis: Secondary | ICD-10-CM | POA: Diagnosis not present

## 2021-02-16 DIAGNOSIS — E538 Deficiency of other specified B group vitamins: Secondary | ICD-10-CM | POA: Diagnosis not present

## 2021-02-23 DIAGNOSIS — E538 Deficiency of other specified B group vitamins: Secondary | ICD-10-CM | POA: Diagnosis not present

## 2021-02-28 ENCOUNTER — Other Ambulatory Visit (HOSPITAL_COMMUNITY): Payer: Self-pay

## 2021-02-28 MED ORDER — GABAPENTIN 100 MG PO CAPS
100.0000 mg | ORAL_CAPSULE | Freq: Every day | ORAL | 5 refills | Status: DC
  Filled 2021-02-28: qty 30, 30d supply, fill #0

## 2021-02-28 MED ORDER — GABAPENTIN 100 MG PO CAPS
200.0000 mg | ORAL_CAPSULE | Freq: Every day | ORAL | 5 refills | Status: DC
  Filled 2021-02-28: qty 90, 30d supply, fill #0

## 2021-03-02 DIAGNOSIS — E538 Deficiency of other specified B group vitamins: Secondary | ICD-10-CM | POA: Diagnosis not present

## 2021-03-03 ENCOUNTER — Other Ambulatory Visit (HOSPITAL_COMMUNITY): Payer: Self-pay

## 2021-03-03 MED FILL — Norethindrone Ace & Ethinyl Estradiol-FE Tab 1 MG-20 MCG: ORAL | 28 days supply | Qty: 28 | Fill #0 | Status: AC

## 2021-03-06 ENCOUNTER — Other Ambulatory Visit (HOSPITAL_COMMUNITY): Payer: Self-pay

## 2021-03-06 MED ORDER — NORETHINDRONE ACET-ETHINYL EST 1-20 MG-MCG PO TABS
1.0000 | ORAL_TABLET | Freq: Every day | ORAL | 0 refills | Status: DC
  Filled 2021-03-06: qty 84, 84d supply, fill #0

## 2021-03-06 MED ORDER — PREDNISOLONE ACETATE 1 % OP SUSP
1.0000 [drp] | Freq: Four times a day (QID) | OPHTHALMIC | 0 refills | Status: DC
  Filled 2021-03-06: qty 10, 50d supply, fill #0

## 2021-03-06 MED ORDER — NORETHIN ACE-ETH ESTRAD-FE 1-20 MG-MCG PO TABS
1.0000 | ORAL_TABLET | Freq: Every day | ORAL | 3 refills | Status: DC
  Filled 2021-03-06: qty 84, 84d supply, fill #0

## 2021-03-09 ENCOUNTER — Other Ambulatory Visit (HOSPITAL_COMMUNITY): Payer: Self-pay

## 2021-03-09 DIAGNOSIS — E538 Deficiency of other specified B group vitamins: Secondary | ICD-10-CM | POA: Diagnosis not present

## 2021-03-09 DIAGNOSIS — Z8669 Personal history of other diseases of the nervous system and sense organs: Secondary | ICD-10-CM | POA: Insufficient documentation

## 2021-03-15 DIAGNOSIS — G35 Multiple sclerosis: Secondary | ICD-10-CM | POA: Diagnosis not present

## 2021-03-16 ENCOUNTER — Other Ambulatory Visit (HOSPITAL_BASED_OUTPATIENT_CLINIC_OR_DEPARTMENT_OTHER): Payer: Self-pay

## 2021-03-16 MED ORDER — ERGOCALCIFEROL 1.25 MG (50000 UT) PO CAPS
1.0000 | ORAL_CAPSULE | ORAL | 0 refills | Status: DC
  Filled 2021-03-16 – 2021-03-17 (×2): qty 8, 56d supply, fill #0

## 2021-03-17 ENCOUNTER — Other Ambulatory Visit (HOSPITAL_COMMUNITY): Payer: Self-pay

## 2021-03-17 ENCOUNTER — Other Ambulatory Visit (HOSPITAL_BASED_OUTPATIENT_CLINIC_OR_DEPARTMENT_OTHER): Payer: Self-pay

## 2021-03-17 DIAGNOSIS — G35 Multiple sclerosis: Secondary | ICD-10-CM | POA: Diagnosis not present

## 2021-03-17 DIAGNOSIS — H20011 Primary iridocyclitis, right eye: Secondary | ICD-10-CM | POA: Diagnosis not present

## 2021-03-17 DIAGNOSIS — R519 Headache, unspecified: Secondary | ICD-10-CM | POA: Diagnosis not present

## 2021-03-17 DIAGNOSIS — H53453 Other localized visual field defect, bilateral: Secondary | ICD-10-CM | POA: Diagnosis not present

## 2021-03-24 DIAGNOSIS — R202 Paresthesia of skin: Secondary | ICD-10-CM | POA: Diagnosis not present

## 2021-03-24 DIAGNOSIS — G35 Multiple sclerosis: Secondary | ICD-10-CM | POA: Diagnosis not present

## 2021-03-24 DIAGNOSIS — E538 Deficiency of other specified B group vitamins: Secondary | ICD-10-CM | POA: Diagnosis not present

## 2021-03-30 DIAGNOSIS — R110 Nausea: Secondary | ICD-10-CM | POA: Diagnosis not present

## 2021-03-30 DIAGNOSIS — G35 Multiple sclerosis: Secondary | ICD-10-CM | POA: Diagnosis not present

## 2021-04-26 ENCOUNTER — Encounter: Payer: Self-pay | Admitting: Neurology

## 2021-04-26 ENCOUNTER — Other Ambulatory Visit (HOSPITAL_COMMUNITY): Payer: Self-pay

## 2021-04-26 ENCOUNTER — Ambulatory Visit: Payer: 59 | Admitting: Neurology

## 2021-04-26 VITALS — BP 122/80 | HR 75 | Ht 69.0 in | Wt 236.5 lb

## 2021-04-26 DIAGNOSIS — G43009 Migraine without aura, not intractable, without status migrainosus: Secondary | ICD-10-CM | POA: Diagnosis not present

## 2021-04-26 DIAGNOSIS — Z79899 Other long term (current) drug therapy: Secondary | ICD-10-CM | POA: Diagnosis not present

## 2021-04-26 DIAGNOSIS — G35 Multiple sclerosis: Secondary | ICD-10-CM | POA: Diagnosis not present

## 2021-04-26 DIAGNOSIS — R208 Other disturbances of skin sensation: Secondary | ICD-10-CM

## 2021-04-26 MED ORDER — ZONISAMIDE 100 MG PO CAPS
100.0000 mg | ORAL_CAPSULE | Freq: Every day | ORAL | 11 refills | Status: DC
  Filled 2021-04-26: qty 30, 30d supply, fill #0
  Filled 2021-06-19: qty 30, 30d supply, fill #1
  Filled 2021-08-06: qty 30, 30d supply, fill #2

## 2021-04-26 MED ORDER — GABAPENTIN 300 MG PO CAPS
300.0000 mg | ORAL_CAPSULE | Freq: Three times a day (TID) | ORAL | 11 refills | Status: DC
  Filled 2021-04-26: qty 90, 30d supply, fill #0
  Filled 2021-07-19: qty 90, 30d supply, fill #1
  Filled 2021-09-03: qty 90, 30d supply, fill #2
  Filled 2021-10-07: qty 90, 30d supply, fill #3
  Filled 2021-11-12: qty 90, 30d supply, fill #4
  Filled 2021-12-17: qty 90, 30d supply, fill #5
  Filled 2022-01-22: qty 90, 30d supply, fill #6
  Filled 2022-03-16: qty 90, 30d supply, fill #7

## 2021-04-26 MED ORDER — RIZATRIPTAN BENZOATE 10 MG PO TBDP
10.0000 mg | ORAL_TABLET | ORAL | 11 refills | Status: DC | PRN
  Filled 2021-04-26: qty 9, 30d supply, fill #0

## 2021-04-26 MED ORDER — ONDANSETRON HCL 4 MG PO TABS
4.0000 mg | ORAL_TABLET | Freq: Three times a day (TID) | ORAL | 5 refills | Status: DC | PRN
  Filled 2021-04-26: qty 20, 7d supply, fill #0
  Filled 2021-06-14: qty 20, 7d supply, fill #1
  Filled 2021-10-07: qty 20, 7d supply, fill #2
  Filled 2021-12-17: qty 20, 7d supply, fill #3

## 2021-04-26 NOTE — Progress Notes (Signed)
[facility]  PATIENT: Ashley Reed DOB: [birth year]  REFERRING DOCTOR OR PCP:  Nathaneil Canary, MD SOURCE: Patient, notes from [facility], imaging and lab results, MRI images personally reviewed  _________________________________   HISTORICAL  CHIEF COMPLAINT:  Chief Complaint  Patient presents with   New Patient (Initial Visit)    Rm 2, alone. Transfer of care from [facility]. Pt dx w MS, on Ocrevus last infusion 7/14. Pt c/o os stomach pn and nausea, starts the day after infusion and last 2 weeks. Pt has noticed hair loss after her last infusion, she has noticed some large clumps come out.     HISTORY OF PRESENT ILLNESS:  I had the pleasure of seeing your patient, Ashley Reed, at the [place] at [facility] for neurologic consultation regarding her recent diagnosis of multiple sclerosis.  She is a 20 year old woman who had numbness from the waist down lasting 1 month in January and February.    She did not have any balance issues.   She also had mild urinary retention.   In April, she had the onset of numbness form the breast down and also had poor balance.   With symptoms persisting she saw the ED at [facility] break but MRI was not up.   She returned to school at [employer] and had an MRI performed 01/06/2021.  This was about 3 weeks after the onset of symptoms.   She was admitted to [facility] and di 7 days of IV Solu-medrol.  Symptoms improved.    She saw Dr. Concha Pyo.   She was started on Ocrevus.  Her last infusion was March 30, 2021.  She noticed issues with stomach pain, nausea lasting a couple weeks from the infusion.  Additionally she had some hair loss since the last infusion.  Currently, she is doing well.   She is walking well and has no difficulty with balance, strength or bladder issues.  Although the numbness below her chest has improved, she is now experiencing dysesthesias and allodynia.  She is noting  facial pain on the left and mildly blurred vision OS.   Color vision is fine.     She has had lower back pain x many years.   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   She has had LBP and occasional leg pain.   A few times she has had her back lock up due to muscle spasms.    This improved with muscle relaxant.   She also has migraine headaches about twice a week.  She takes Excedrin when 1 occurs.  She has never been on prophylactic therapy for the migraines.  Mood and cognition are doing well.  Imaging personally reviewed (and shown to patient): MRI of the brain 01/06/2021 shows multiple T2/FLAIR hyperintense foci, predominantly in the periventricular white matter including the callosal septal fibers.  1 focus is also in the frontal juxtacortical white matter on the left.  None of the foci enhance.    Cervical, thoracic and lumbar spine MRI 01/06/2021 showed a large T2 hyperintense field is adjacent to T6.  It showed faint enhancement degenerative changes are noted at L3-L4, L4-L5 and L5-S1.[name]  There is spinal stenosis at L3-L4 .      Pertinent laboratory tests: 01/07/2021: CSF showed 3 oligoclonal bands.  Myelin basic protein was elevated. 02/10/2021: Hepatitis panel was negative.  Vitamin D was mildly low.  TSH was normal REVIEW OF SYSTEMS:  Constitutional: No fevers, chills, sweats, or change in appetite Eyes: No visual changes, double vision, eye pain Ear, nose and throat: No hearing loss, ear pain, nasal congestion, sore throat Cardiovascular: No chest pain, palpitations Respiratory:  No shortness of breath at rest or with exertion.   No wheezes GastrointestinaI: No nausea, vomiting, diarrhea, abdominal pain, fecal incontinence Genitourinary:  No dysuria, urinary retention or frequency.  No nocturia. Musculoskeletal:  No neck pain, back pain Integumentary: No rash, pruritus, skin lesions Neurological: as above Psychiatric: No depression at this time.  No  anxiety Endocrine: No palpitations, diaphoresis, change in appetite, change in weigh or increased thirst Hematologic/Lymphatic:  No anemia, purpura, petechiae. Allergic/Immunologic: No itchy/runny eyes, nasal congestion, recent allergic reactions, rashes  ALLERGIES: No Known Allergies  HOME MEDICATIONS:  Current Outpatient Medications:    adapalene (DIFFERIN) 0.1 % cream, APPLY PEA SIZED AMOUNT TO FINGER AND SPREAD THIN LAYER TO ACNE AREAS ONCE A DAY IN MORNING, Disp: , Rfl:    albuterol (PROVENTIL HFA;VENTOLIN HFA) 108 (90 Base) MCG/ACT inhaler, Inhale 2 puffs 15 minutes prior to exercise and every 4 hours prn cough, wheeze with asthma flare, Disp: , Rfl:    cyclobenzaprine (FLEXERIL) 10 MG tablet, Take 1 tablet (10 mg total) by mouth 3 (three) times daily as needed for muscle spasms., Disp: 30 tablet, Rfl: 1   diclofenac sodium (VOLTAREN) 1 % GEL, Apply to large joint area up to three times a day as needed, Disp: 2 Tube, Rfl: 1   ergocalciferol (VITAMIN D2) 1.25 MG (50000 UT) capsule, Take 1 capsule (50,000 Units total) by mouth once a week., Disp: 8 capsule, Rfl: 0   Fluticasone-Salmeterol (ADVAIR) 250-50 MCG/DOSE AEPB, INHALE 1 PUFF AS DIRECTED TWICE A DAY, Disp: 60 each, Rfl: 2   gabapentin (NEURONTIN) 300 MG capsule, Take 1 capsule (300 mg total) by mouth 3 (three) times daily., Disp: 90 capsule, Rfl: 11   LARIN FE 1/20 1-20 MG-MCG tablet, Take 1 tablet by mouth daily., Disp: , Rfl: 4   naproxen (NAPROSYN) 500 MG tablet, Take 1 tablet (500 mg total) by mouth 2 (two) times daily with a meal. (Patient taking differently: Take 500 mg by mouth 2 (two) times daily as needed.), Disp: 60 tablet, Rfl: 1   norethindrone-ethinyl estradiol (LOESTRIN) 1-20 MG-MCG tablet, Take 1 tablet by mouth daily., Disp: 84 tablet, Rfl: 0   norethindrone-ethinyl estradiol-FE (BLISOVI FE 1/20) 1-20 MG-MCG tablet, Take 1 tablet by mouth daily., Disp: 84 tablet, Rfl: 3   ondansetron (ZOFRAN) 4 MG tablet, Take 1  tablet (4 mg total) by mouth every 8 (eight) hours as needed for nausea or vomiting., Disp: 20 tablet, Rfl: 5   prednisoLONE acetate (PRED FORTE) 1 % ophthalmic suspension, Place 1 drop into the right eye 4 (four) times daily. (Patient taking differently: Place 1 drop into the right eye 4 (four) times daily as needed.), Disp: 10 mL, Rfl: 0   rizatriptan (MAXALT-MLT) 10 MG disintegrating tablet, Take 1 tablet (10 mg total) by mouth as needed for migraine. May repeat in 2 hours if needed, Disp: 9 tablet, Rfl: 11   tiZANidine (ZANAFLEX) 2 MG tablet, Take 1-2 tablets (2-4 mg total) by mouth every 6 (six) hours as needed for muscle spasms., Disp: 60 tablet, Rfl: 1   trimethoprim-polymyxin b (POLYTRIM) ophthalmic solution, Place 1 drop into the left eye every 6 (six) hours., Disp: 10 mL, Rfl: 0   zonisamide (ZONEGRAN) 100 MG capsule, Take 1 capsule (100 mg total) by mouth daily., Disp: 30 capsule,  Rfl: 11  PAST MEDICAL HISTORY: Past Medical History:  Diagnosis Date   Asthma    MS (multiple sclerosis) ([place]) 01/07/2021    PAST SURGICAL HISTORY: Past Surgical History:  Procedure Laterality Date   BREAST REDUCTION SURGERY      FAMILY HISTORY: Family History  Problem Relation Age of Onset   Fibromyalgia Mother    Bipolar disorder Father    Asthma Brother    Asthma Brother    Asthma Brother    Mental illness Brother     SOCIAL HISTORY:  Social History   Socioeconomic History   Marital status: Single    Spouse name: Not on file   Number of children: Not on file   Years of education: Not on file   Highest education level: Bachelor's degree (e.g., BA, AB, BS)  Occupational History   Not on file  Tobacco Use   Smoking status: Never   Smokeless tobacco: Never  Vaping Use   Vaping Use: Never used  Substance and Sexual Activity   Alcohol use: Yes    Comment: on occasion   Drug use: Not Currently   Sexual activity: Not on file  Other Topics Concern   Not on file  Social History  Narrative   Lives with mother   Right handed   Caffeine: 1-2 coffee in a week   Social Determinants of Health   Financial Resource Strain: Not on file  Food Insecurity: Not on file  Transportation Needs: Not on file  Physical Activity: Not on file  Stress: Not on file  Social Connections: Not on file  Intimate Partner Violence: Not on file     PHYSICAL EXAM  Vitals:   04/26/21 0855  BP: 122/80  Pulse: 75  Weight: 236 lb 8 oz (107.3 kg)  Height: '5\' 9"'$  (1.753 m)    Body mass index is 34.92 kg/m.  Vision Screening   Right eye Left eye Both eyes  Without correction 20/30 20/100 20/50  With correction     Comments: Has eye appt on 8/14 to determine if she needs glasses   General: The patient is well-developed and well-nourished and in no acute distress  HEENT:  Head is [place]/AT.  Sclera are anicteric.  Funduscopic exam shows normal optic discs and retinal vessels.  Neck: No carotid bruits are noted.  The neck is nontender.  Cardiovascular: The heart has a regular rate and rhythm with a normal S1 and S2. There were no murmurs, gallops or rubs.    Skin: Extremities are without rash or  edema.  Musculoskeletal:  Back is nontender  Neurologic Exam  Mental status: The patient is alert and oriented x 3 at the time of the examination. The patient has apparent normal recent and remote memory, with an apparently normal attention span and concentration ability.   Speech is normal.  Cranial nerves: Extraocular movements are full. Pupils are equal, round, and reactive to light and accomodation.  Visual Venable are full.  Facial symmetry is present. There is good facial sensation to soft touch bilaterally.Facial strength is normal.  Trapezius and sternocleidomastoid strength is normal. No dysarthria is noted.  The tongue is midline, and the patient has symmetric elevation of the soft palate. No obvious hearing deficits are noted.  Motor:  Muscle bulk is normal.   Tone is normal.  Strength is  5 / 5 in all 4 extremities.   Sensory: Sensory testing is intact to vibration sensation in all 4 extremities.  She has allodynia to cold  temperature and altered sensation to touch below the T7 level.  Coordination: Cerebellar testing reveals good finger-nose-finger and heel-to-shin bilaterally.  Gait and station: Station is normal.   Gait is normal. Tandem gait is normal. Romberg is negative.   Reflexes: Deep tendon reflexes are symmetric and normal bilaterally.   Plantar responses are flexor.    DIAGNOSTIC DATA (LABS, IMAGING, TESTING) - I reviewed patient records, labs, notes, testing and imaging myself where available.  Lab Results  Component Value Date   WBC 5.8 01/01/2021   HGB 13.1 01/01/2021   HCT 37.2 01/01/2021   MCV 90.3 01/01/2021   PLT 258 01/01/2021      Component Value Date/Time   NA 139 01/01/2021 1025   K 3.6 01/01/2021 1025   CL 104 01/01/2021 1025   CO2 27 01/01/2021 1025   GLUCOSE 88 01/01/2021 1025   BUN 8 01/01/2021 1025   CREATININE 0.71 01/01/2021 1025   CALCIUM 9.0 01/01/2021 1025   PROT 6.7 01/01/2021 1025   ALBUMIN 4.1 01/01/2021 1025   AST 13 (L) 01/01/2021 1025   ALT 10 01/01/2021 1025   ALKPHOS 54 01/01/2021 1025   BILITOT 0.6 01/01/2021 1025   GFRNONAA >60 01/01/2021 1025   No results found for: CHOL, HDL, LDLCALC, LDLDIRECT, TRIG, CHOLHDL No results found for: HGBA1C Lab Results  Component Value Date   VITAMINB12 173 (L) 01/01/2021   Lab Results  Component Value Date   TSH 2.937 01/01/2021       ASSESSMENT AND PLAN  Relapsing remitting multiple sclerosis (HCC)  High risk medication use  Migraine without aura and without status migrainosus, not intractable  Dysesthesia    In summary, Ashley Reed is a 20 year old woman with recent diagnosis of multiple sclerosis.  Her exacerbation at the beginning of April is most consistent with the plaque that is seen at T6.  I discussed with her that I concur with the  choice of Ocrevus as she is presenting with a more than average level of aggressiveness.  Her next infusion will be in January and we will set her up locally.  She has other issues including dysesthesias and migraine headaches.  Her migraines occur fairly frequently and I will add zonisamide for prophylaxis and Maxalt as needed.  Additionally, I will increase the gabapentin for her dysesthesias up to a dose of 300 3 times daily as her current dose is too low.  If the dysesthesias do not improve and are not helped by gabapentin, consider lamotrigine or a tricyclic.  Additionally I refilled her ondansetron for her intermittent nausea.  She should continue to remain active and exercise as tolerated.  Thank you for asking me to see Ashley Reed.  Please let me know if I can be of further assistance with her or other patients in the future.  Jaelene Garciagarcia A. Felecia Shelling, MD, [facility] [id number], [id number] AM Certified in Neurology, Clinical Neurophysiology, Sleep Medicine and Neuroimaging  [facility] [address] [place], [place] [address] [phone]

## 2021-05-01 ENCOUNTER — Telehealth: Payer: Self-pay

## 2021-05-01 NOTE — Telephone Encounter (Signed)
Received Ocrevus start form.  Faxed Ocrevus start form to [place].  Received a receipt of confirmation.  Ocrevus order and start form given to our infusion suite for processing.  Patient is not due for her infusion until January 2023.  Last infusion was March 30, 2021.

## 2021-05-03 ENCOUNTER — Encounter: Payer: Self-pay | Admitting: Nurse Practitioner

## 2021-05-03 ENCOUNTER — Ambulatory Visit (INDEPENDENT_AMBULATORY_CARE_PROVIDER_SITE_OTHER): Payer: 59 | Admitting: Nurse Practitioner

## 2021-05-03 ENCOUNTER — Other Ambulatory Visit: Payer: Self-pay

## 2021-05-03 VITALS — BP 110/72 | HR 78 | Temp 97.6°F | Ht 69.0 in | Wt 235.0 lb

## 2021-05-03 DIAGNOSIS — R87619 Unspecified abnormal cytological findings in specimens from cervix uteri: Secondary | ICD-10-CM | POA: Insufficient documentation

## 2021-05-03 DIAGNOSIS — Z7689 Persons encountering health services in other specified circumstances: Secondary | ICD-10-CM | POA: Diagnosis not present

## 2021-05-03 DIAGNOSIS — G35 Multiple sclerosis: Secondary | ICD-10-CM | POA: Diagnosis not present

## 2021-05-03 NOTE — Progress Notes (Signed)
New Patient Office Visit  Subjective:  Patient ID: Ashley Reed, female    DOB: 2001/07/19  Age: 20 y.o. MRN: [id number]  CC:  Chief Complaint  Patient presents with   New Patient (Initial Visit)    HPI Ashley Reed presents to establish new primary care provider.  She recently graduated from [employer].  She has not back to [facility] and needs to establish new care locally.  She states that in December, 2021 she had an episode of strange numbness of both of her legs.  States that she saw her primary care provider and [place], [place].  Was told she has sciatica.  This gradually improved.  She had a second episode in April, 2021.  Second episode, she had numbness from the nipple line all the way down to her feet.  States that she was having frequent falls during this time.  States that she was essentially dismissed by her PCP in [place].  She went to the ED emergency department.  Was diagnosed with a "MS" type disease process.  Emergency department asked that her PCP order MRI of the spine and brain.  PCP initially ordered only MRI of the spine, but later changed to MRI of the spine and brain.  Patient was found to have very textbook multiple sclerosis.  She is now seeing a neurologist.  She does get IV infusions of Ocrevus.  Tolerating this well.  She has a GYN provider and recently had well woman exam and Pap smear.  She does have an eye doctor who she will see prior to mid September.  Patient states that overall, she is doing quite well. The patient has no physical concerns or complaints at this time.  She denies chest pain, chest pressure, or shortness of breath. He denies headaches or visual disturbances. He denies abdominal pain, nausea, vomiting, or changes in bowel or bladder habits.    Past Medical History:  Diagnosis Date   Asthma    MS (multiple sclerosis) ([place]) 01/07/2021    Past Surgical History:  Procedure Laterality Date   BREAST REDUCTION  SURGERY      Family History  Problem Relation Age of Onset   Fibromyalgia Mother    Bipolar disorder Father    Asthma Brother    Asthma Brother    Asthma Brother    Mental illness Brother     Social History   Socioeconomic History   Marital status: Single    Spouse name: Not on file   Number of children: Not on file   Years of education: Not on file   Highest education level: Bachelor's degree (e.g., BA, AB, BS)  Occupational History   Not on file  Tobacco Use   Smoking status: Never   Smokeless tobacco: Never  Vaping Use   Vaping Use: Never used  Substance and Sexual Activity   Alcohol use: Yes    Comment: on occasion   Drug use: Not Currently   Sexual activity: Not on file  Other Topics Concern   Not on file  Social History Narrative   Lives with mother   Right handed   Caffeine: 1-2 coffee in a week   Social Determinants of Health   Financial Resource Strain: Not on file  Food Insecurity: Not on file  Transportation Needs: Not on file  Physical Activity: Not on file  Stress: Not on file  Social Connections: Not on file  Intimate Partner Violence: Not on file    ROS  Review of Systems  Constitutional:  Negative for activity change, appetite change, chills, fatigue and fever.  HENT:  Negative for postnasal drip, rhinorrhea, sinus pressure and sinus pain.   Eyes: Negative.   Respiratory:  Negative for cough, chest tightness, shortness of breath and wheezing.   Cardiovascular:  Negative for chest pain and palpitations.  Gastrointestinal:  Negative for abdominal pain, constipation, diarrhea, nausea and vomiting.  Endocrine: Negative for cold intolerance, heat intolerance, polydipsia and polyuria.  Musculoskeletal:  Negative for arthralgias, back pain and myalgias.  Skin:  Negative for rash.  Allergic/Immunologic: Negative.   Neurological:  Positive for weakness. Negative for dizziness and headaches.       Weakness during time of MS flare.  Patient  currently doing well.  Psychiatric/Behavioral:  Negative for dysphoric mood. The patient is not nervous/anxious.    Objective:   Today's Vitals   05/03/21 0941  BP: 110/72  Pulse: 78  Temp: 97.6 F (36.4 C)  SpO2: 98%  Weight: 235 lb (106.6 kg)  Height: '5\' 9"'$  (1.753 m)   Body mass index is 34.7 kg/m.   Physical Exam Vitals and nursing note reviewed.  Constitutional:      Appearance: Normal appearance. She is well-developed.  HENT:     Head: Normocephalic and atraumatic.     Nose: Nose normal.     Mouth/Throat:     Mouth: Mucous membranes are moist.  Eyes:     Conjunctiva/sclera: Conjunctivae normal.     Pupils: Pupils are equal, round, and reactive to light.  Cardiovascular:     Rate and Rhythm: Normal rate and regular rhythm.     Pulses: Normal pulses.     Heart sounds: Normal heart sounds.  Pulmonary:     Effort: Pulmonary effort is normal.     Breath sounds: Normal breath sounds.  Abdominal:     Palpations: Abdomen is soft.  Musculoskeletal:        General: Normal range of motion.     Cervical back: Normal range of motion and neck supple.  Lymphadenopathy:     Cervical: No cervical adenopathy.  Skin:    General: Skin is warm and dry.     Capillary Refill: Capillary refill takes less than 2 seconds.  Neurological:     General: No focal deficit present.     Mental Status: She is alert and oriented to person, place, and time.  Psychiatric:        Mood and Affect: Mood normal.        Behavior: Behavior normal.        Thought Content: Thought content normal.        Judgment: Judgment normal.    Assessment & Plan:  1. Encounter to establish care Appointment today to establish new primary care provider.  2. Multiple sclerosis ([place]) Patient with known history of relapsing multiple sclerosis.  She does see neurologist and receives Ocrevus infusions.  Doing well.  Monitored frequently.  Problem List Items Addressed This Visit       Nervous and Auditory    Multiple sclerosis ([place])     Other   Encounter to establish care - Primary    Outpatient Encounter Medications as of 05/03/2021  Medication Sig   adapalene (DIFFERIN) 0.1 % cream APPLY PEA SIZED AMOUNT TO FINGER AND SPREAD THIN LAYER TO ACNE AREAS ONCE A DAY IN MORNING   albuterol (PROVENTIL HFA;VENTOLIN HFA) 108 (90 Base) MCG/ACT inhaler Inhale 2 puffs 15 minutes prior to exercise and every 4 hours  prn cough, wheeze with asthma flare   cyclobenzaprine (FLEXERIL) 10 MG tablet Take 1 tablet (10 mg total) by mouth 3 (three) times daily as needed for muscle spasms.   diclofenac sodium (VOLTAREN) 1 % GEL Apply to large joint area up to three times a day as needed   ergocalciferol (VITAMIN D2) 1.25 MG (50000 UT) capsule Take 1 capsule (50,000 Units total) by mouth once a week.   Fluticasone-Salmeterol (ADVAIR) 250-50 MCG/DOSE AEPB INHALE 1 PUFF AS DIRECTED TWICE A DAY   gabapentin (NEURONTIN) 300 MG capsule Take 1 capsule (300 mg total) by mouth 3 (three) times daily.   LARIN FE 1/20 1-20 MG-MCG tablet Take 1 tablet by mouth daily.   naproxen (NAPROSYN) 500 MG tablet Take 1 tablet (500 mg total) by mouth 2 (two) times daily with a meal. (Patient taking differently: Take 500 mg by mouth 2 (two) times daily as needed.)   ondansetron (ZOFRAN) 4 MG tablet Take 1 tablet (4 mg total) by mouth every 8 (eight) hours as needed for nausea or vomiting.   prednisoLONE acetate (PRED FORTE) 1 % ophthalmic suspension Place 1 drop into the right eye 4 (four) times daily. (Patient taking differently: Place 1 drop into the right eye 4 (four) times daily as needed.)   tiZANidine (ZANAFLEX) 2 MG tablet Take 1-2 tablets (2-4 mg total) by mouth every 6 (six) hours as needed for muscle spasms.   trimethoprim-polymyxin b (POLYTRIM) ophthalmic solution Place 1 drop into the left eye every 6 (six) hours.   zonisamide (ZONEGRAN) 100 MG capsule Take 1 capsule (100 mg total) by mouth daily.   [DISCONTINUED] norethindrone-ethinyl  estradiol (LOESTRIN) 1-20 MG-MCG tablet Take 1 tablet by mouth daily. (Patient not taking: Reported on 05/03/2021)   [DISCONTINUED] norethindrone-ethinyl estradiol-FE (BLISOVI FE 1/20) 1-20 MG-MCG tablet Take 1 tablet by mouth daily. (Patient not taking: Reported on 05/03/2021)   [DISCONTINUED] rizatriptan (MAXALT-MLT) 10 MG disintegrating tablet Take 1 tablet (10 mg total) by mouth as needed for migraine. May repeat in 2 hours if needed (Patient not taking: Reported on 05/03/2021)   No facility-administered encounter medications on file as of 05/03/2021.   This note was dictated using Systems analyst. Rapid proofreading was performed to expedite the delivery of the information. Despite proofreading, phonetic errors will occur which are common with this voice recognition software. Please take this into consideration. If there are any concerns, please contact our office.    Follow-up: Return in about 4 weeks (around 05/31/2021) for health maintenance exam - can be three to four weeks .   Ronnell Freshwater, NP

## 2021-05-03 NOTE — Patient Instructions (Signed)
https://www.nhlbi.nih.gov/files/docs/public/heart/dash_brief.pdf">  DASH Eating Plan DASH stands for Dietary Approaches to Stop Hypertension. The DASH eating plan is a healthy eating plan that has been shown to: Reduce high blood pressure (hypertension). Reduce your risk for type 2 diabetes, heart disease, and stroke. Help with weight loss. What are tips for following this plan? Reading food labels Check food labels for the amount of salt (sodium) per serving. Choose foods with less than 5 percent of the Daily Value of sodium. Generally, foods with less than 300 milligrams (mg) of sodium per serving fit into this eating plan. To find whole grains, look for the word "whole" as the first word in the ingredient list. Shopping Buy products labeled as "low-sodium" or "no salt added." Buy fresh foods. Avoid canned foods and pre-made or frozen meals. Cooking Avoid adding salt when cooking. Use salt-free seasonings or herbs instead of table salt or sea salt. Check with your health care provider or pharmacist before using salt substitutes. Do not fry foods. Cook foods using healthy methods such as baking, boiling, grilling, roasting, and broiling instead. Cook with heart-healthy oils, such as olive, canola, avocado, soybean, or sunflower oil. Meal planning  Eat a balanced diet that includes: 4 or more servings of fruits and 4 or more servings of vegetables each day. Try to fill one-half of your plate with fruits and vegetables. 6-8 servings of whole grains each day. Less than 6 oz (170 g) of lean meat, poultry, or fish each day. A 3-oz (85-g) serving of meat is about the same size as a deck of cards. One egg equals 1 oz (28 g). 2-3 servings of low-fat dairy each day. One serving is 1 cup (237 mL). 1 serving of nuts, seeds, or beans 5 times each week. 2-3 servings of heart-healthy fats. Healthy fats called omega-3 fatty acids are found in foods such as walnuts, flaxseeds, fortified milks, and eggs.  These fats are also found in cold-water fish, such as sardines, salmon, and mackerel. Limit how much you eat of: Canned or prepackaged foods. Food that is high in trans fat, such as some fried foods. Food that is high in saturated fat, such as fatty meat. Desserts and other sweets, sugary drinks, and other foods with added sugar. Full-fat dairy products. Do not salt foods before eating. Do not eat more than 4 egg yolks a week. Try to eat at least 2 vegetarian meals a week. Eat more home-cooked food and less restaurant, buffet, and fast food.  Lifestyle When eating at a restaurant, ask that your food be prepared with less salt or no salt, if possible. If you drink alcohol: Limit how much you use to: 0-1 drink a day for women who are not pregnant. 0-2 drinks a day for men. Be aware of how much alcohol is in your drink. In the [place] one drink equals one 12 oz bottle of beer (355 mL), one 5 oz glass of wine (148 mL), or one 1 oz glass of hard liquor (44 mL). General information Avoid eating more than 2,300 mg of salt a day. If you have hypertension, you may need to reduce your sodium intake to 1,500 mg a day. Work with your health care provider to maintain a healthy body weight or to lose weight. Ask what an ideal weight is for you. Get at least 30 minutes of exercise that causes your heart to beat faster (aerobic exercise) most days of the week. Activities may include walking, swimming, or biking. Work with your health care provider   or dietitian to adjust your eating plan to your individual calorie needs. What foods should I eat? Fruits All fresh, dried, or frozen fruit. Canned fruit in natural juice (without addedsugar). Vegetables Fresh or frozen vegetables (raw, steamed, roasted, or grilled). Low-sodium or reduced-sodium tomato and vegetable juice. Low-sodium or reduced-sodium tomatosauce and tomato paste. Low-sodium or reduced-sodium canned vegetables. Grains Whole-grain or  whole-wheat bread. Whole-grain or whole-wheat pasta. Brown rice. Oatmeal. Quinoa. Bulgur. Whole-grain and low-sodium cereals. Pita bread.Low-fat, low-sodium crackers. Whole-wheat flour tortillas. Meats and other proteins Skinless chicken or turkey. Ground chicken or turkey. Pork with fat trimmed off. Fish and seafood. Egg whites. Dried beans, peas, or lentils. Unsalted nuts, nut butters, and seeds. Unsalted canned beans. Lean cuts of beef with fat trimmed off. Low-sodium, lean precooked or cured meat, such as sausages or meatloaves. Dairy Low-fat (1%) or fat-free (skim) milk. Reduced-fat, low-fat, or fat-free cheeses. Nonfat, low-sodium ricotta or cottage cheese. Low-fat or nonfatyogurt. Low-fat, low-sodium cheese. Fats and oils Soft margarine without trans fats. Vegetable oil. Reduced-fat, low-fat, or light mayonnaise and salad dressings (reduced-sodium). Canola, safflower, olive, avocado, soybean, andsunflower oils. Avocado. Seasonings and condiments Herbs. Spices. Seasoning mixes without salt. Other foods Unsalted popcorn and pretzels. Fat-free sweets. The items listed above may not be a complete list of foods and beverages you can eat. Contact a dietitian for more information. What foods should I avoid? Fruits Canned fruit in a light or heavy syrup. Fried fruit. Fruit in cream or buttersauce. Vegetables Creamed or fried vegetables. Vegetables in a cheese sauce. Regular canned vegetables (not low-sodium or reduced-sodium). Regular canned tomato sauce and paste (not low-sodium or reduced-sodium). Regular tomato and vegetable juice(not low-sodium or reduced-sodium). Pickles. Olives. Grains Baked goods made with fat, such as croissants, muffins, or some breads. Drypasta or rice meal packs. Meats and other proteins Fatty cuts of meat. Ribs. Fried meat. Bacon. Bologna, salami, and other precooked or cured meats, such as sausages or meat loaves. Fat from the back of a pig (fatback). Bratwurst.  Salted nuts and seeds. Canned beans with added salt. Canned orsmoked fish. Whole eggs or egg yolks. Chicken or turkey with skin. Dairy Whole or 2% milk, cream, and half-and-half. Whole or full-fat cream cheese. Whole-fat or sweetened yogurt. Full-fat cheese. Nondairy creamers. Whippedtoppings. Processed cheese and cheese spreads. Fats and oils Butter. Stick margarine. Lard. Shortening. Ghee. Bacon fat. Tropical oils, suchas coconut, palm kernel, or palm oil. Seasonings and condiments Onion salt, garlic salt, seasoned salt, table salt, and sea salt. Worcestershire sauce. Tartar sauce. Barbecue sauce. Teriyaki sauce. Soy sauce, including reduced-sodium. Steak sauce. Canned and packaged gravies. Fish sauce. Oyster sauce. Cocktail sauce. Store-bought horseradish. Ketchup. Mustard. Meat flavorings and tenderizers. Bouillon cubes. Hot sauces. Pre-made or packaged marinades. Pre-made or packaged taco seasonings. Relishes. Regular saladdressings. Other foods Salted popcorn and pretzels. The items listed above may not be a complete list of foods and beverages you should avoid. Contact a dietitian for more information. Where to find more information National Heart, Lung, and Blood Institute: www.nhlbi.nih.gov American Heart Association: www.heart.org Academy of Nutrition and Dietetics: www.eatright.org National Kidney Foundation: www.kidney.org Summary The DASH eating plan is a healthy eating plan that has been shown to reduce high blood pressure (hypertension). It may also reduce your risk for type 2 diabetes, heart disease, and stroke. When on the DASH eating plan, aim to eat more fresh fruits and vegetables, whole grains, lean proteins, low-fat dairy, and heart-healthy fats. With the DASH eating plan, you should limit salt (sodium) intake to 2,300   mg a day. If you have hypertension, you may need to reduce your sodium intake to 1,500 mg a day. Work with your health care provider or dietitian to adjust  your eating plan to your individual calorie needs. This information is not intended to replace advice given to you by your health care provider. Make sure you discuss any questions you have with your healthcare provider. Document Revised: 08/07/2019 Document Reviewed: 08/07/2019 Elsevier Patient Education  2022 Elsevier Inc.  

## 2021-05-26 ENCOUNTER — Other Ambulatory Visit (HOSPITAL_COMMUNITY): Payer: Self-pay

## 2021-05-26 MED ORDER — MELOXICAM 15 MG PO TABS
15.0000 mg | ORAL_TABLET | Freq: Every day | ORAL | 3 refills | Status: AC
  Filled 2021-05-26: qty 30, 30d supply, fill #0
  Filled 2021-07-16: qty 30, 30d supply, fill #1
  Filled 2021-10-31: qty 30, 30d supply, fill #2

## 2021-05-30 ENCOUNTER — Ambulatory Visit (INDEPENDENT_AMBULATORY_CARE_PROVIDER_SITE_OTHER): Payer: 59 | Admitting: Nurse Practitioner

## 2021-05-30 ENCOUNTER — Other Ambulatory Visit: Payer: Self-pay

## 2021-05-30 ENCOUNTER — Encounter: Payer: Self-pay | Admitting: Nurse Practitioner

## 2021-05-30 VITALS — BP 110/76 | HR 80 | Temp 98.4°F | Ht 69.0 in | Wt 239.7 lb

## 2021-05-30 DIAGNOSIS — Z6835 Body mass index (BMI) 35.0-35.9, adult: Secondary | ICD-10-CM | POA: Diagnosis not present

## 2021-05-30 DIAGNOSIS — E559 Vitamin D deficiency, unspecified: Secondary | ICD-10-CM

## 2021-05-30 DIAGNOSIS — Z0001 Encounter for general adult medical examination with abnormal findings: Secondary | ICD-10-CM

## 2021-05-30 DIAGNOSIS — R5383 Other fatigue: Secondary | ICD-10-CM | POA: Diagnosis not present

## 2021-05-30 DIAGNOSIS — G35 Multiple sclerosis: Secondary | ICD-10-CM | POA: Diagnosis not present

## 2021-05-30 DIAGNOSIS — L659 Nonscarring hair loss, unspecified: Secondary | ICD-10-CM | POA: Diagnosis not present

## 2021-05-30 NOTE — Progress Notes (Signed)
Established Patient Office Visit  Subjective:  Patient ID: Ashley Reed, female    DOB: [birth year]  Age: 20 y.o. MRN: [id number]  CC:  Chief Complaint  Patient presents with   Annual Exam    HPI [place] presents for annual wellness visit.  Patient states that for last several months, she has noticed that her hair has been falling out in clumps.  States this is more than normal shedding.  States she frequently sweats.  Has had lots of weight fluctuations, but no more than a few pounds up and down.  Has had a very difficult time with losing weight and keeping it off.  She does have a new diagnosis of MS.  She is currently receiving IV Ocrevus.  Has tolerated this well.  She is due to have routine, fasting labs.  She does see a GYN provider for well woman care and Pap smears.  She has no other concerns or complaints today.  She denies chest pain, chest pressure, or shortness of breath. She denies headaches or visual disturbances. She denies abdominal pain, nausea, vomiting, or changes in bowel or bladder habits.    Past Medical History:  Diagnosis Date   Asthma    MS (multiple sclerosis) ([place]) 01/07/2021    Past Surgical History:  Procedure Laterality Date   BREAST REDUCTION SURGERY      Family History  Problem Relation Age of Onset   Fibromyalgia Mother    Bipolar disorder Father    Asthma Brother    Asthma Brother    Asthma Brother    Mental illness Brother     Social History   Socioeconomic History   Marital status: Single    Spouse name: Not on file   Number of children: Not on file   Years of education: Not on file   Highest education level: Bachelor's degree (e.g., BA, AB, BS)  Occupational History   Not on file  Tobacco Use   Smoking status: Never   Smokeless tobacco: Never  Vaping Use   Vaping Use: Never used  Substance and Sexual Activity   Alcohol use: Yes    Comment: on occasion   Drug use: Not Currently   Sexual activity: Not on file   Other Topics Concern   Not on file  Social History Narrative   Lives with mother   Right handed   Caffeine: 1-2 coffee in a week   Social Determinants of Health   Financial Resource Strain: Not on file  Food Insecurity: Not on file  Transportation Needs: Not on file  Physical Activity: Not on file  Stress: Not on file  Social Connections: Not on file  Intimate Partner Violence: Not on file    Outpatient Medications Prior to Visit  Medication Sig Dispense Refill   adapalene (DIFFERIN) 0.1 % cream APPLY PEA SIZED AMOUNT TO FINGER AND SPREAD THIN LAYER TO ACNE AREAS ONCE A DAY IN MORNING     albuterol (PROVENTIL HFA;VENTOLIN HFA) 108 (90 Base) MCG/ACT inhaler Inhale 2 puffs 15 minutes prior to exercise and every 4 hours prn cough, wheeze with asthma flare     cyclobenzaprine (FLEXERIL) 10 MG tablet Take 1 tablet (10 mg total) by mouth 3 (three) times daily as needed for muscle spasms. 30 tablet 1   diclofenac sodium (VOLTAREN) 1 % GEL Apply to large joint area up to three times a day as needed 2 Tube 1   Fluticasone-Salmeterol (ADVAIR) 250-50 MCG/DOSE AEPB INHALE 1 PUFF AS DIRECTED  TWICE A DAY 60 each 2   gabapentin (NEURONTIN) 300 MG capsule Take 1 capsule (300 mg total) by mouth 3 (three) times daily. 90 capsule 11   LARIN FE 1/20 1-20 MG-MCG tablet Take 1 tablet by mouth daily.  4   meloxicam (MOBIC) 15 MG tablet Take 1 tablet (15 mg total) by mouth daily. 30 tablet 3   naproxen (NAPROSYN) 500 MG tablet Take 1 tablet (500 mg total) by mouth 2 (two) times daily with a meal. (Patient taking differently: Take 500 mg by mouth 2 (two) times daily as needed.) 60 tablet 1   ondansetron (ZOFRAN) 4 MG tablet Take 1 tablet (4 mg total) by mouth every 8 (eight) hours as needed for nausea or vomiting. 20 tablet 5   prednisoLONE acetate (PRED FORTE) 1 % ophthalmic suspension Place 1 drop into the right eye 4 (four) times daily. (Patient taking differently: Place 1 drop into the right eye 4 (four)  times daily as needed.) 10 mL 0   tiZANidine (ZANAFLEX) 2 MG tablet Take 1-2 tablets (2-4 mg total) by mouth every 6 (six) hours as needed for muscle spasms. 60 tablet 1   zonisamide (ZONEGRAN) 100 MG capsule Take 1 capsule (100 mg total) by mouth daily. 30 capsule 11   ergocalciferol (VITAMIN D2) 1.25 MG (50000 UT) capsule Take 1 capsule (50,000 Units total) by mouth once a week. 8 capsule 0   No facility-administered medications prior to visit.    No Known Allergies  ROS Review of Systems  Constitutional:  Positive for fatigue. Negative for activity change, appetite change, chills and fever.  HENT:  Negative for congestion, postnasal drip, rhinorrhea, sinus pressure, sinus pain, sneezing and sore throat.   Eyes: Negative.   Respiratory:  Negative for cough, chest tightness, shortness of breath and wheezing.   Cardiovascular:  Negative for chest pain and palpitations.  Gastrointestinal:  Negative for abdominal pain, constipation, diarrhea, nausea and vomiting.  Endocrine: Negative for cold intolerance, heat intolerance, polydipsia and polyuria.  Genitourinary:  Negative for dyspareunia, dysuria, flank pain, frequency and urgency.  Musculoskeletal:  Negative for arthralgias, back pain and myalgias.  Skin:  Negative for rash.  Allergic/Immunologic: Negative for environmental allergies.  Neurological:  Negative for dizziness, weakness and headaches.  Hematological:  Negative for adenopathy.  Psychiatric/Behavioral:  The patient is not nervous/anxious.      Objective:    Physical Exam Vitals and nursing note reviewed.  Constitutional:      Appearance: Normal appearance. She is well-developed. She is obese.  HENT:     Head: Normocephalic and atraumatic.     Right Ear: Tympanic membrane, ear canal and external ear normal.     Left Ear: Tympanic membrane, ear canal and external ear normal.     Nose: Nose normal.     Mouth/Throat:     Mouth: Mucous membranes are moist.     Pharynx:  Oropharynx is clear.  Eyes:     Extraocular Movements: Extraocular movements intact.     Conjunctiva/sclera: Conjunctivae normal.     Pupils: Pupils are equal, round, and reactive to light.  Cardiovascular:     Rate and Rhythm: Normal rate and regular rhythm.     Pulses: Normal pulses.     Heart sounds: Normal heart sounds.  Pulmonary:     Effort: Pulmonary effort is normal.     Breath sounds: Normal breath sounds.  Abdominal:     General: Bowel sounds are normal.     Palpations: Abdomen is soft.  Tenderness: There is no abdominal tenderness.  Musculoskeletal:        General: Normal range of motion.     Cervical back: Normal range of motion and neck supple.  Lymphadenopathy:     Cervical: No cervical adenopathy.  Skin:    General: Skin is warm and dry.     Capillary Refill: Capillary refill takes less than 2 seconds.  Neurological:     General: No focal deficit present.     Mental Status: She is alert and oriented to person, place, and time.  Psychiatric:        Mood and Affect: Mood normal.        Behavior: Behavior normal.        Thought Content: Thought content normal.        Judgment: Judgment normal.    Today's Vitals   05/30/21 1354  BP: 110/76  Pulse: 80  Temp: 98.4 F (36.9 C)  SpO2: 100%  Weight: 239 lb 11.2 oz (108.7 kg)  Height: _0  (1.753 m)   Body mass index is 35.4 kg/m.   Wt Readings from Last 3 Encounters:  05/30/21 239 lb 11.2 oz (108.7 kg)  05/03/21 235 lb (106.6 kg)  04/26/21 236 lb 8 oz (107.3 kg)     Health Maintenance Due  Topic Date Due   HPV VACCINES (1 - 2-dose series) Never done   HIV Screening  Never done   Hepatitis C Screening  Never done   PAP-Cervical Cytology Screening  Never done   PAP SMEAR-Modifier  Never done   TETANUS/TDAP  04/14/2020   COVID-19 Vaccine (4 - Booster for Pfizer series) 09/16/2020       Topic Date Due   HPV VACCINES (1 - 2-dose series) Never done    Lab Results  Component Value Date   TSH  2.440 05/31/2021   Lab Results  Component Value Date   WBC 5.8 05/31/2021   HGB 13.8 05/31/2021   HCT 40.6 05/31/2021   MCV 91 05/31/2021   PLT 311 05/31/2021   Lab Results  Component Value Date   NA 138 05/31/2021   K 4.4 05/31/2021   CO2 22 05/31/2021   GLUCOSE 74 05/31/2021   BUN 12 05/31/2021   CREATININE 0.85 05/31/2021   BILITOT 0.6 01/01/2021   ALKPHOS 54 01/01/2021   AST 13 (L) 01/01/2021   ALT 10 01/01/2021   PROT 6.7 01/01/2021   ALBUMIN 4.1 01/01/2021   CALCIUM 8.9 05/31/2021   ANIONGAP 8 01/01/2021   EGFR 99 05/31/2021   No results found for: CHOL No results found for: HDL No results found for: LDLCALC No results found for: TRIG No results found for: CHOLHDL No results found for: HGBA1C    Assessment & Plan:  1. Encounter for general adult medical examination with abnormal findings Annual wellness visit today.  2. Alopecia Will check routine, fasting labs.  This will include TSH and free T4.  Will discuss results with patient when available. - CBC  3. Fatigue, unspecified type Check blood count, TSH, free T4, and vitamin B12 levels for further evaluation.  We will discuss with patient when those results are available. - CBC - TSH + free T4 - Vitamin H99 - Basic Metabolic Panel (BMET)  4. Body mass index (BMI) of 35.0-35.9 in adult Recommend patient limit calorie intake to 1500 cal/day.  Should be low-fat and low-cholesterol.  Encouraged her to incorporate exercise into her daily routine.  We will discuss options for medication to help  with weight management after lab results are available.  She voiced understanding and agreement.  5. Vitamin D deficiency Check vitamin D level today and treat deficiency as indicated. - Vitamin D (25 hydroxy)  6. Multiple sclerosis ([place]) Patient should continue with routine visits to neurology and Ocrevus infusions.  Problem List Items Addressed This Visit       Nervous and Auditory   Multiple sclerosis ([place])      Musculoskeletal and Integument   Alopecia   Relevant Orders   CBC (Completed)     Other   Encounter for general adult medical examination with abnormal findings - Primary   Fatigue   Relevant Orders   CBC (Completed)   TSH + free T4 (Completed)   Vitamin B12 (Completed)   Basic Metabolic Panel (BMET) (Completed)   Vitamin D deficiency   Relevant Orders   Vitamin D (25 hydroxy) (Completed)   Body mass index (BMI) of 35.0-35.9 in adult   This note was dictated using Systems analyst. Rapid proofreading was performed to expedite the delivery of the information. Despite proofreading, phonetic errors will occur which are common with this voice recognition software. Please take this into consideration. If there are any concerns, please contact our office.     Follow-up: Return in about 1 year (around 05/30/2022) for health maintenance exam, FBW a week prior to visit.    Ronnell Freshwater, NP

## 2021-05-31 ENCOUNTER — Other Ambulatory Visit: Payer: 59

## 2021-05-31 DIAGNOSIS — L659 Nonscarring hair loss, unspecified: Secondary | ICD-10-CM | POA: Diagnosis not present

## 2021-05-31 DIAGNOSIS — R5383 Other fatigue: Secondary | ICD-10-CM | POA: Diagnosis not present

## 2021-05-31 DIAGNOSIS — E559 Vitamin D deficiency, unspecified: Secondary | ICD-10-CM | POA: Diagnosis not present

## 2021-06-01 ENCOUNTER — Ambulatory Visit (INDEPENDENT_AMBULATORY_CARE_PROVIDER_SITE_OTHER): Payer: 59 | Admitting: *Deleted

## 2021-06-01 ENCOUNTER — Encounter: Payer: Self-pay | Admitting: Nurse Practitioner

## 2021-06-01 ENCOUNTER — Other Ambulatory Visit (HOSPITAL_COMMUNITY): Payer: Self-pay

## 2021-06-01 ENCOUNTER — Other Ambulatory Visit: Payer: Self-pay

## 2021-06-01 ENCOUNTER — Other Ambulatory Visit: Payer: Self-pay | Admitting: Nurse Practitioner

## 2021-06-01 DIAGNOSIS — E559 Vitamin D deficiency, unspecified: Secondary | ICD-10-CM

## 2021-06-01 DIAGNOSIS — Z23 Encounter for immunization: Secondary | ICD-10-CM

## 2021-06-01 LAB — BASIC METABOLIC PANEL
BUN/Creatinine Ratio: 14 (ref 9–23)
BUN: 12 mg/dL (ref 6–20)
CO2: 22 mmol/L (ref 20–29)
Calcium: 8.9 mg/dL (ref 8.7–10.2)
Chloride: 100 mmol/L (ref 96–106)
Creatinine, Ser: 0.85 mg/dL (ref 0.57–1.00)
Glucose: 74 mg/dL (ref 65–99)
Potassium: 4.4 mmol/L (ref 3.5–5.2)
Sodium: 138 mmol/L (ref 134–144)
eGFR: 99 mL/min/{1.73_m2} (ref >59)

## 2021-06-01 LAB — CBC
Hematocrit: 40.6 % (ref 34.0–46.6)
Hemoglobin: 13.8 g/dL (ref 11.1–15.9)
MCH: 30.8 pg (ref 26.6–33.0)
MCHC: 34 g/dL (ref 31.5–35.7)
MCV: 91 fL (ref 79–97)
Platelets: 311 10*3/uL (ref 150–450)
RBC: 4.48 x10E6/uL (ref 3.77–5.28)
RDW: 12.1 % (ref 11.7–15.4)
WBC: 5.8 10*3/uL (ref 3.4–10.8)

## 2021-06-01 LAB — VITAMIN B12: Vitamin B-12: 471 pg/mL (ref 232–1245)

## 2021-06-01 LAB — TSH+FREE T4
Free T4: 1.09 ng/dL (ref 0.82–1.77)
TSH: 2.44 u[IU]/mL (ref 0.450–4.500)

## 2021-06-01 LAB — VITAMIN D 25 HYDROXY (VIT D DEFICIENCY, FRACTURES): Vit D, 25-Hydroxy: 31.3 ng/mL (ref 30.0–100.0)

## 2021-06-01 MED ORDER — ERGOCALCIFEROL 1.25 MG (50000 UT) PO CAPS
1.0000 | ORAL_CAPSULE | ORAL | 1 refills | Status: DC
  Filled 2021-06-01: qty 12, 84d supply, fill #0
  Filled 2021-08-20: qty 12, 84d supply, fill #1

## 2021-06-01 NOTE — Progress Notes (Signed)
Labs normal. Mychart message sent to patient

## 2021-06-01 NOTE — Progress Notes (Signed)
Added Drisdol 50,000 international units once weekly for next 6 months.  New prescription sent to him in his current outpatient pharmacy.

## 2021-06-05 NOTE — Telephone Encounter (Signed)
Is today when Anderson Malta is bringing more samples or Ozempic?

## 2021-06-06 NOTE — Telephone Encounter (Signed)
Patient's mom picked up sample for patient at 9:14am.

## 2021-06-11 DIAGNOSIS — Z6830 Body mass index (BMI) 30.0-30.9, adult: Secondary | ICD-10-CM | POA: Insufficient documentation

## 2021-06-11 DIAGNOSIS — R5383 Other fatigue: Secondary | ICD-10-CM | POA: Insufficient documentation

## 2021-06-11 DIAGNOSIS — Z6835 Body mass index (BMI) 35.0-35.9, adult: Secondary | ICD-10-CM | POA: Insufficient documentation

## 2021-06-11 DIAGNOSIS — E559 Vitamin D deficiency, unspecified: Secondary | ICD-10-CM | POA: Insufficient documentation

## 2021-06-11 DIAGNOSIS — Z6833 Body mass index (BMI) 33.0-33.9, adult: Secondary | ICD-10-CM | POA: Insufficient documentation

## 2021-06-11 DIAGNOSIS — L659 Nonscarring hair loss, unspecified: Secondary | ICD-10-CM | POA: Insufficient documentation

## 2021-06-11 DIAGNOSIS — Z6826 Body mass index (BMI) 26.0-26.9, adult: Secondary | ICD-10-CM | POA: Insufficient documentation

## 2021-06-11 DIAGNOSIS — Z0001 Encounter for general adult medical examination with abnormal findings: Secondary | ICD-10-CM | POA: Insufficient documentation

## 2021-06-11 NOTE — Patient Instructions (Signed)
Calorie Counting for Weight Loss Calories are units of energy. Your body needs a certain number of calories from food to keep going throughout the day. When you eat or drink more calories than your body needs, your body stores the extra calories mostly as fat. When you eat or drink fewer calories than your body needs, your body burns fat to get the energy it needs. Calorie counting means keeping track of how many calories you eat and drink each day. Calorie counting can be helpful if you need to lose weight. If you eat fewer calories than your body needs, you should lose weight. Ask your health care provider what a healthy weight is for you. For calorie counting to work, you will need to eat the right number of calories each day to lose a healthy amount of weight per week. A dietitian can help you figure out how many calories you need in a day and will suggest ways to reach your calorie goal. A healthy amount of weight to lose each week is usually 1-2 lb (0.5-0.9 kg). This usually means that your daily calorie intake should be reduced by 500-750 calories. Eating 1,200-1,500 calories a day can help most women lose weight. Eating 1,500-1,800 calories a day can help most men lose weight. What do I need to know about calorie counting? Work with your health care provider or dietitian to determine how many calories you should get each day. To meet your daily calorie goal, you will need to: Find out how many calories are in each food that you would like to eat. Try to do this before you eat. Decide how much of the food you plan to eat. Keep a food log. Do this by writing down what you ate and how many calories it had. To successfully lose weight, it is important to balance calorie counting with a healthy lifestyle that includes regular activity. Where do I find calorie information? The number of calories in a food can be found on a Nutrition Facts label. If a food does not have a Nutrition Facts label, try to  look up the calories online or ask your dietitian for help. Remember that calories are listed per serving. If you choose to have more than one serving of a food, you will have to multiply the calories per serving by the number of servings you plan to eat. For example, the label on a package of bread might say that a serving size is 1 slice and that there are 90 calories in a serving. If you eat 1 slice, you will have eaten 90 calories. If you eat 2 slices, you will have eaten 180 calories. How do I keep a food log? After each time that you eat, record the following in your food log as soon as possible: What you ate. Be sure to include toppings, sauces, and other extras on the food. How much you ate. This can be measured in cups, ounces, or number of items. How many calories were in each food and drink. The total number of calories in the food you ate. Keep your food log near you, such as in a pocket-sized notebook or on an app or website on your mobile phone. Some programs will calculate calories for you and show you how many calories you have left to meet your daily goal. What are some portion-control tips? Know how many calories are in a serving. This will help you know how many servings you can have of a certain food.  Use a measuring cup to measure serving sizes. You could also try weighing out portions on a kitchen scale. With time, you will be able to estimate serving sizes for some foods. Take time to put servings of different foods on your favorite plates or in your favorite bowls and cups so you know what a serving looks like. Try not to eat straight from a food's packaging, such as from a bag or box. Eating straight from the package makes it hard to see how much you are eating and can lead to overeating. Put the amount you would like to eat in a cup or on a plate to make sure you are eating the right portion. Use smaller plates, glasses, and bowls for smaller portions and to prevent  overeating. Try not to multitask. For example, avoid watching TV or using your computer while eating. If it is time to eat, sit down at a table and enjoy your food. This will help you recognize when you are full. It will also help you be more mindful of what and how much you are eating. What are tips for following this plan? Reading food labels Check the calorie count compared with the serving size. The serving size may be smaller than what you are used to eating. Check the source of the calories. Try to choose foods that are high in protein, fiber, and vitamins, and low in saturated fat, trans fat, and sodium. Shopping Read nutrition labels while you shop. This will help you make healthy decisions about which foods to buy. Pay attention to nutrition labels for low-fat or fat-free foods. These foods sometimes have the same number of calories or more calories than the full-fat versions. They also often have added sugar, starch, or salt to make up for flavor that was removed with the fat. Make a grocery list of lower-calorie foods and stick to it. Cooking Try to cook your favorite foods in a healthier way. For example, try baking instead of frying. Use low-fat dairy products. Meal planning Use more fruits and vegetables. One-half of your plate should be fruits and vegetables. Include lean proteins, such as chicken, [place], and fish. Lifestyle Each week, aim to do one of the following: 150 minutes of moderate exercise, such as walking. 75 minutes of vigorous exercise, such as running. General information Know how many calories are in the foods you eat most often. This will help you calculate calorie counts faster. Find a way of tracking calories that works for you. Get creative. Try different apps or programs if writing down calories does not work for you. What foods should I eat?  Eat nutritious foods. It is better to have a nutritious, high-calorie food, such as an avocado, than a food with  few nutrients, such as a bag of potato chips. Use your calories on foods and drinks that will fill you up and will not leave you hungry soon after eating. Examples of foods that fill you up are nuts and nut butters, vegetables, lean proteins, and high-fiber foods such as whole grains. High-fiber foods are foods with more than 5 g of fiber per serving. Pay attention to calories in drinks. Low-calorie drinks include water and unsweetened drinks. The items listed above may not be a complete list of foods and beverages you can eat. Contact a dietitian for more information. What foods should I limit? Limit foods or drinks that are not good sources of vitamins, minerals, or protein or that are high in unhealthy fats. These include:  Candy. Other sweets. Sodas, specialty coffee drinks, alcohol, and juice. The items listed above may not be a complete list of foods and beverages you should avoid. Contact a dietitian for more information. How do I count calories when eating out? Pay attention to portions. Often, portions are much larger when eating out. Try these tips to keep portions smaller: Consider sharing a meal instead of getting your own. If you get your own meal, eat only half of it. Before you start eating, ask for a container and put half of your meal into it. When available, consider ordering smaller portions from the menu instead of full portions. Pay attention to your food and drink choices. Knowing the way food is cooked and what is included with the meal can help you eat fewer calories. If calories are listed on the menu, choose the lower-calorie options. Choose dishes that include vegetables, fruits, whole grains, low-fat dairy products, and lean proteins. Choose items that are boiled, broiled, grilled, or steamed. Avoid items that are buttered, battered, fried, or served with cream sauce. Items labeled as crispy are usually fried, unless stated otherwise. Choose water, low-fat milk,  unsweetened iced tea, or other drinks without added sugar. If you want an alcoholic beverage, choose a lower-calorie option, such as a glass of wine or light beer. Ask for dressings, sauces, and syrups on the side. These are usually high in calories, so you should limit the amount you eat. If you want a salad, choose a garden salad and ask for grilled meats. Avoid extra toppings such as bacon, cheese, or fried items. Ask for the dressing on the side, or ask for olive oil and vinegar or lemon to use as dressing. Estimate how many servings of a food you are given. Knowing serving sizes will help you be aware of how much food you are eating at restaurants. Where to find more information Centers for Disease Control and Prevention: http://www.wolf.info/ [place] Department of Agriculture: http://www.wilson-mendoza.org/ Summary Calorie counting means keeping track of how many calories you eat and drink each day. If you eat fewer calories than your body needs, you should lose weight. A healthy amount of weight to lose per week is usually 1-2 lb (0.5-0.9 kg). This usually means reducing your daily calorie intake by 500-750 calories. The number of calories in a food can be found on a Nutrition Facts label. If a food does not have a Nutrition Facts label, try to look up the calories online or ask your dietitian for help. Use smaller plates, glasses, and bowls for smaller portions and to prevent overeating. Use your calories on foods and drinks that will fill you up and not leave you hungry shortly after a meal. This information is not intended to replace advice given to you by your health care provider. Make sure you discuss any questions you have with your health care provider. Document Revised: 10/15/2019 Document Reviewed: 10/15/2019 Elsevier Patient Education  2022 [place] and Cholesterol Restricted Eating Plan Getting too much fat and cholesterol in your diet may cause health problems. Choosing the right foods helps keep  your fat and cholesterol at normal levels. This can keep you from getting certain diseases. Your doctor may recommend an eating plan that includes: Total fat: ______% or less of total calories a day. Saturated fat: ______% or less of total calories a day. Cholesterol: less than _________mg a day. Fiber: ______g a day. What are tips for following this plan? Meal planning At meals, divide your plate  into four equal parts: Fill one-half of your plate with vegetables and green salads. Fill one-fourth of your plate with whole grains. Fill one-fourth of your plate with low-fat (lean) protein foods. Eat fish that is high in omega-3 fats at least two times a week. This includes mackerel, tuna, sardines, and salmon. Eat foods that are high in fiber, such as whole grains, beans, apples, broccoli, carrots, peas, and barley. General tips  Work with your doctor to lose weight if you need to. Avoid: Foods with added sugar. Fried foods. Foods with partially hydrogenated oils. Limit alcohol intake to no more than 1 drink a day for nonpregnant women and 2 drinks a day for men. One drink equals 12 oz of beer, 5 oz of wine, or 1 oz of hard liquor. Reading food labels Check food labels for: Trans fats. Partially hydrogenated oils. Saturated fat (g) in each serving. Cholesterol (mg) in each serving. Fiber (g) in each serving. Choose foods with healthy fats, such as: Monounsaturated fats. Polyunsaturated fats. Omega-3 fats. Choose grain products that have whole grains. Look for the word "whole" as the first word in the ingredient list. Cooking Cook foods using low-fat methods. These include baking, boiling, grilling, and broiling. Eat more home-cooked foods. Eat at restaurants and buffets less often. Avoid cooking using saturated fats, such as butter, cream, palm oil, palm kernel oil, and coconut oil. Recommended foods Fruits All fresh, canned (in natural juice), or frozen  fruits. Vegetables Fresh or frozen vegetables (raw, steamed, roasted, or grilled). Green salads. Grains Whole grains, such as whole wheat or whole grain breads, crackers, cereals, and pasta. Unsweetened oatmeal, bulgur, barley, quinoa, or brown rice. Corn or whole wheat flour tortillas. Meats and other protein foods Ground beef (85% or leaner), grass-fed beef, or beef trimmed of fat. Skinless chicken or [place]. Ground chicken or [place]. Pork trimmed of fat. All fish and seafood. Egg whites. Dried beans, peas, or lentils. Unsalted nuts or seeds. Unsalted canned beans. Nut butters without added sugar or oil. Dairy Low-fat or nonfat dairy products, such as skim or 1% milk, 2% or reduced-fat cheeses, low-fat and fat-free ricotta or cottage cheese, or plain low-fat and nonfat yogurt. Fats and oils Tub margarine without trans fats. Light or reduced-fat mayonnaise and salad dressings. Avocado. Olive, canola, sesame, or safflower oils. The items listed above may not be a complete list of foods and beverages you can eat. Contact a dietitian for more information. Foods to avoid Fruits Canned fruit in heavy syrup. Fruit in cream or butter sauce. Fried fruit. Vegetables Vegetables cooked in cheese, cream, or butter sauce. Fried vegetables. Grains White bread. White pasta. White rice. Cornbread. Bagels, pastries, and croissants. Crackers and snack foods that contain trans fat and hydrogenated oils. Meats and other protein foods Fatty cuts of meat. Ribs, chicken wings, bacon, sausage, bologna, salami, chitterlings, fatback, hot dogs, bratwurst, and packaged lunch meats. Liver and organ meats. Whole eggs and egg yolks. Chicken and [place] with skin. Fried meat. Dairy Whole or 2% milk, cream, half-and-half, and cream cheese. Whole milk cheeses. Whole-fat or sweetened yogurt. Full-fat cheeses. Nondairy creamers and whipped toppings. Processed cheese, cheese spreads, and cheese curds. Beverages Alcohol.  Sugar-sweetened drinks such as sodas, lemonade, and fruit drinks. Fats and oils Butter, stick margarine, lard, shortening, ghee, or bacon fat. Coconut, palm kernel, and palm oils. Sweets and desserts Corn syrup, sugars, honey, and molasses. Candy. Jam and jelly. Syrup. Sweetened cereals. Cookies, pies, cakes, donuts, muffins, and ice cream. The items listed above  may not be a complete list of foods and beverages you should avoid. Contact a dietitian for more information. Summary Choosing the right foods helps keep your fat and cholesterol at normal levels. This can keep you from getting certain diseases. At meals, fill one-half of your plate with vegetables and green salads. Eat high-fiber foods, like whole grains, beans, apples, carrots, peas, and barley. Limit added sugar, saturated fats, alcohol, and fried foods. This information is not intended to replace advice given to you by your health care provider. Make sure you discuss any questions you have with your health care provider. Document Revised: 01/06/2020 Document Reviewed: 01/06/2020 Elsevier Patient Education  2022 [employer].

## 2021-06-14 ENCOUNTER — Other Ambulatory Visit (HOSPITAL_COMMUNITY): Payer: Self-pay

## 2021-06-19 ENCOUNTER — Other Ambulatory Visit (HOSPITAL_COMMUNITY): Payer: Self-pay

## 2021-06-19 ENCOUNTER — Telehealth: Payer: 59 | Admitting: Family Medicine

## 2021-06-19 DIAGNOSIS — R300 Dysuria: Secondary | ICD-10-CM

## 2021-06-19 MED ORDER — NITROFURANTOIN MONOHYD MACRO 100 MG PO CAPS
100.0000 mg | ORAL_CAPSULE | Freq: Two times a day (BID) | ORAL | 0 refills | Status: AC
  Filled 2021-06-19: qty 10, 5d supply, fill #0

## 2021-06-19 NOTE — Progress Notes (Signed)

## 2021-06-20 ENCOUNTER — Other Ambulatory Visit (HOSPITAL_COMMUNITY): Payer: Self-pay

## 2021-06-28 ENCOUNTER — Other Ambulatory Visit (HOSPITAL_COMMUNITY): Payer: Self-pay

## 2021-06-29 ENCOUNTER — Other Ambulatory Visit (HOSPITAL_COMMUNITY): Payer: Self-pay

## 2021-06-29 MED ORDER — NORETHINDRONE ACET-ETHINYL EST 1-20 MG-MCG PO TABS
1.0000 | ORAL_TABLET | Freq: Every day | ORAL | 1 refills | Status: DC
  Filled 2021-06-29: qty 84, 84d supply, fill #0
  Filled 2021-10-07: qty 84, 84d supply, fill #1

## 2021-07-10 ENCOUNTER — Other Ambulatory Visit: Payer: Self-pay | Admitting: *Deleted

## 2021-07-10 ENCOUNTER — Other Ambulatory Visit (HOSPITAL_COMMUNITY): Payer: Self-pay

## 2021-07-10 DIAGNOSIS — R5383 Other fatigue: Secondary | ICD-10-CM

## 2021-07-10 DIAGNOSIS — G35 Multiple sclerosis: Secondary | ICD-10-CM

## 2021-07-10 MED ORDER — MODAFINIL 200 MG PO TABS
200.0000 mg | ORAL_TABLET | Freq: Every day | ORAL | 5 refills | Status: DC
Start: 2021-07-10 — End: 2021-12-25
  Filled 2021-08-06: qty 30, 30d supply, fill #0
  Filled 2021-09-18: qty 30, 30d supply, fill #1
  Filled 2021-10-15: qty 30, 30d supply, fill #2
  Filled 2021-11-19: qty 30, 30d supply, fill #3

## 2021-07-10 MED ORDER — MODAFINIL 200 MG PO TABS
200.0000 mg | ORAL_TABLET | Freq: Every day | ORAL | 5 refills | Status: DC
  Filled 2021-07-10: qty 30, 30d supply, fill #0

## 2021-07-10 NOTE — Addendum Note (Signed)
Addended by: Larey Seat on: 07/10/2021 03:59 PM   Modules accepted: Orders

## 2021-07-10 NOTE — Addendum Note (Signed)
Addended by: Wyvonnia Lora on: 07/10/2021 12:55 PM   Modules accepted: Orders

## 2021-07-13 ENCOUNTER — Encounter: Payer: Self-pay | Admitting: Nurse Practitioner

## 2021-07-13 ENCOUNTER — Other Ambulatory Visit: Payer: Self-pay

## 2021-07-13 ENCOUNTER — Other Ambulatory Visit (HOSPITAL_COMMUNITY): Payer: Self-pay

## 2021-07-13 ENCOUNTER — Ambulatory Visit: Payer: 59 | Admitting: Nurse Practitioner

## 2021-07-13 VITALS — BP 105/72 | HR 85 | Temp 97.6°F | Ht 69.0 in | Wt 241.0 lb

## 2021-07-13 DIAGNOSIS — R300 Dysuria: Secondary | ICD-10-CM | POA: Diagnosis not present

## 2021-07-13 DIAGNOSIS — R7301 Impaired fasting glucose: Secondary | ICD-10-CM

## 2021-07-13 DIAGNOSIS — Z6835 Body mass index (BMI) 35.0-35.9, adult: Secondary | ICD-10-CM | POA: Diagnosis not present

## 2021-07-13 DIAGNOSIS — G35 Multiple sclerosis: Secondary | ICD-10-CM

## 2021-07-13 LAB — POCT URINALYSIS DIP (CLINITEK)
Bilirubin, UA: NEGATIVE
Blood, UA: NEGATIVE
Glucose, UA: NEGATIVE mg/dL
Ketones, POC UA: NEGATIVE mg/dL
Nitrite, UA: NEGATIVE
POC PROTEIN,UA: NEGATIVE
Spec Grav, UA: <=1.005 — AB (ref 1.010–1.025)
Urobilinogen, UA: 0.2 E.U./dL
pH, UA: 6 (ref 5.0–8.0)

## 2021-07-13 MED ORDER — SAXENDA 18 MG/3ML ~~LOC~~ SOPN
PEN_INJECTOR | SUBCUTANEOUS | 2 refills | Status: DC
Start: 2021-07-13 — End: 2021-07-13
  Filled 2021-07-13: qty 15, 30d supply, fill #0

## 2021-07-13 MED ORDER — OZEMPIC (0.25 OR 0.5 MG/DOSE) 2 MG/1.5ML ~~LOC~~ SOPN
0.5000 mg | PEN_INJECTOR | SUBCUTANEOUS | 3 refills | Status: DC
Start: 2021-07-13 — End: 2021-10-13
  Filled 2021-07-13: qty 4.5, 84d supply, fill #0
  Filled 2021-10-07: qty 1.5, 28d supply, fill #1

## 2021-07-13 NOTE — Patient Instructions (Signed)

## 2021-07-13 NOTE — Progress Notes (Signed)
Established Patient Office Visit  Subjective:  Patient ID: Ashley Reed, female    DOB: [birth year]  Age: 20 y.o. MRN: [id number]  CC:  Chief Complaint  Patient presents with   Weight Check    HPI [name] presents for follow up visit. The patient does have multiple sclerosis. She has been having trouble with urination. This was one of the initial symptoms she had when first diagnosed with MS. She she is not feeling the urge to urinate. After a bit of time passes, her stomach starts hurting really bad. That is when she knows she needs to urinate. She is having to push really hard or strain to move the urine out of the bladder. She did have e-visit with [facility]. Was treated with antibiotics for possible uti. Symptoms were no better. She has reached out to her neurologist who is out of town.  Nurse asked that urine sample be checked today for acute abnormalities. There is trace WBC with no other abnormalities.    Past Medical History:  Diagnosis Date   Asthma    MS (multiple sclerosis) ([place]) 01/07/2021    Past Surgical History:  Procedure Laterality Date   BREAST REDUCTION SURGERY      Family History  Problem Relation Age of Onset   Fibromyalgia Mother    Bipolar disorder Father    Asthma Brother    Asthma Brother    Asthma Brother    Mental illness Brother     Social History   Socioeconomic History   Marital status: Single    Spouse name: Not on file   Number of children: Not on file   Years of education: Not on file   Highest education level: Bachelor's degree (e.g., BA, AB, BS)  Occupational History   Not on file  Tobacco Use   Smoking status: Never   Smokeless tobacco: Never  Vaping Use   Vaping Use: Never used  Substance and Sexual Activity   Alcohol use: Yes    Comment: on occasion   Drug use: Not Currently   Sexual activity: Not on file  Other Topics Concern   Not on file  Social History Narrative   Lives with mother   Right handed    Caffeine: 1-2 coffee in a week   Social Determinants of Health   Financial Resource Strain: Not on file  Food Insecurity: Not on file  Transportation Needs: Not on file  Physical Activity: Not on file  Stress: Not on file  Social Connections: Not on file  Intimate Partner Violence: Not on file    Outpatient Medications Prior to Visit  Medication Sig Dispense Refill   adapalene (DIFFERIN) 0.1 % cream APPLY PEA SIZED AMOUNT TO FINGER AND SPREAD THIN LAYER TO ACNE AREAS ONCE A DAY IN MORNING     albuterol (PROVENTIL HFA;VENTOLIN HFA) 108 (90 Base) MCG/ACT inhaler Inhale 2 puffs 15 minutes prior to exercise and every 4 hours prn cough, wheeze with asthma flare     cyclobenzaprine (FLEXERIL) 10 MG tablet Take 1 tablet (10 mg total) by mouth 3 (three) times daily as needed for muscle spasms. 30 tablet 1   diclofenac sodium (VOLTAREN) 1 % GEL Apply to large joint area up to three times a day as needed 2 Tube 1   ergocalciferol (VITAMIN D2) 1.25 MG (50000 UT) capsule Take 1 capsule (50,000 Units total) by mouth once a week. 12 capsule 1   Fluticasone-Salmeterol (ADVAIR) 250-50 MCG/DOSE AEPB INHALE 1 PUFF AS  DIRECTED TWICE A DAY 60 each 2   gabapentin (NEURONTIN) 300 MG capsule Take 1 capsule (300 mg total) by mouth 3 (three) times daily. 90 capsule 11   LARIN FE 1/20 1-20 MG-MCG tablet Take 1 tablet by mouth daily.  4   meloxicam (MOBIC) 15 MG tablet Take 1 tablet (15 mg total) by mouth daily. 30 tablet 3   modafinil (PROVIGIL) 200 MG tablet Take 1 tablet (200 mg total) by mouth daily. 30 tablet 5   naproxen (NAPROSYN) 500 MG tablet Take 1 tablet (500 mg total) by mouth 2 (two) times daily with a meal. (Patient taking differently: Take 500 mg by mouth 2 (two) times daily as needed.) 60 tablet 1   norethindrone-ethinyl estradiol (LARIN 1/20) 1-20 MG-MCG tablet Take 1 tablet by mouth daily. 84 tablet 1   ondansetron (ZOFRAN) 4 MG tablet Take 1 tablet (4 mg total) by mouth every 8 (eight) hours as  needed for nausea or vomiting. 20 tablet 5   prednisoLONE acetate (PRED FORTE) 1 % ophthalmic suspension Place 1 drop into the right eye 4 (four) times daily. (Patient taking differently: Place 1 drop into the right eye 4 (four) times daily as needed.) 10 mL 0   tiZANidine (ZANAFLEX) 2 MG tablet Take 1-2 tablets (2-4 mg total) by mouth every 6 (six) hours as needed for muscle spasms. 60 tablet 1   zonisamide (ZONEGRAN) 100 MG capsule Take 1 capsule (100 mg total) by mouth daily. 30 capsule 11   No facility-administered medications prior to visit.    No Known Allergies  ROS Review of Systems  Constitutional:  Positive for fatigue. Negative for activity change, appetite change, chills and fever.  HENT:  Negative for congestion, postnasal drip, rhinorrhea, sinus pressure, sinus pain, sneezing and sore throat.   Eyes: Negative.   Respiratory:  Negative for cough, chest tightness, shortness of breath and wheezing.   Cardiovascular:  Negative for chest pain and palpitations.  Gastrointestinal:  Negative for abdominal pain, constipation, diarrhea, nausea and vomiting.  Endocrine: Negative for cold intolerance, heat intolerance, polydipsia and polyuria.  Genitourinary:  Positive for decreased urine volume and urgency. Negative for dyspareunia, dysuria, flank pain and frequency.  Musculoskeletal:  Negative for arthralgias, back pain and myalgias.  Skin:  Negative for rash.  Allergic/Immunologic: Negative for environmental allergies.  Neurological:  Positive for weakness. Negative for dizziness and headaches.  Hematological:  Negative for adenopathy.  Psychiatric/Behavioral:  The patient is not nervous/anxious.      Objective:    Physical Exam Vitals and nursing note reviewed.  Constitutional:      Appearance: Normal appearance. She is well-developed. She is obese.  HENT:     Head: Normocephalic and atraumatic.     Nose: Nose normal.     Mouth/Throat:     Mouth: Mucous membranes are  moist.  Eyes:     Extraocular Movements: Extraocular movements intact.     Conjunctiva/sclera: Conjunctivae normal.     Pupils: Pupils are equal, round, and reactive to light.  Cardiovascular:     Rate and Rhythm: Normal rate and regular rhythm.     Pulses: Normal pulses.     Heart sounds: Normal heart sounds.  Pulmonary:     Effort: Pulmonary effort is normal.     Breath sounds: Normal breath sounds.  Abdominal:     Palpations: Abdomen is soft.  Genitourinary:    Comments: UA positive for trace WBC only.  Musculoskeletal:        General: Normal  range of motion.     Cervical back: Normal range of motion and neck supple.  Lymphadenopathy:     Cervical: No cervical adenopathy.  Skin:    General: Skin is warm and dry.     Capillary Refill: Capillary refill takes less than 2 seconds.  Neurological:     General: No focal deficit present.     Mental Status: She is alert and oriented to person, place, and time.  Psychiatric:        Mood and Affect: Mood normal.        Behavior: Behavior normal.        Thought Content: Thought content normal.        Judgment: Judgment normal.   Today's Vitals   07/13/21 0816  BP: 105/72  Pulse: 85  Temp: 97.6 F (36.4 C)  SpO2: 100%  Weight: 241 lb (109.3 kg)  Height: _0  (1.753 m)   Body mass index is 35.59 kg/m.   Wt Readings from Last 3 Encounters:  07/13/21 241 lb (109.3 kg)  05/30/21 239 lb 11.2 oz (108.7 kg)  05/03/21 235 lb (106.6 kg)     Health Maintenance Due  Topic Date Due   Pneumococcal Vaccine 8-64 Years old (1 - PCV) Never done   HIV Screening  Never done   Hepatitis C Screening  Never done   PAP SMEAR-Modifier  Never done   COVID-19 Vaccine (4 - Booster for Pfizer series) 08/19/2020    There are no preventive care reminders to display for this patient.  Lab Results  Component Value Date   TSH 2.440 05/31/2021   Lab Results  Component Value Date   WBC 5.8 05/31/2021   HGB 13.8 05/31/2021   HCT 40.6  05/31/2021   MCV 91 05/31/2021   PLT 311 05/31/2021   Lab Results  Component Value Date   NA 138 05/31/2021   K 4.4 05/31/2021   CO2 22 05/31/2021   GLUCOSE 74 05/31/2021   BUN 12 05/31/2021   CREATININE 0.85 05/31/2021   BILITOT 0.6 01/01/2021   ALKPHOS 54 01/01/2021   AST 13 (L) 01/01/2021   ALT 10 01/01/2021   PROT 6.7 01/01/2021   ALBUMIN 4.1 01/01/2021   CALCIUM 8.9 05/31/2021   ANIONGAP 8 01/01/2021   EGFR 99 05/31/2021   No results found for: CHOL No results found for: HDL No results found for: LDLCALC No results found for: TRIG No results found for: CHOLHDL No results found for: HGBA1C    Assessment & Plan:  1. Impaired fasting glucose Patient tolerating Ozempic 0.5 mg weekly well.  Continues to eat a 1500-calorie diet.  Is gradually increasing level of physical activity.  We will have her increase Ozempic to 0.5 mg weekly.  We will see patient back in 3 months for continued surveillance. - Semaglutide,0.25 or 0.5MG/DOS, (OZEMPIC, 0.25 OR 0.5 MG/DOSE,) 2 MG/1.5ML SOPN; Inject 0.5 mg into the skin once a week.  Dispense: 15 mL; Refill: 3  2. Body mass index (BMI) of 35.0-35.9 in adult Will have patient increase Ozempic to 0.5 mg weekly. Continue to keep calorie intake to 1500 calories per day and incorporating exercise into daily routine to help lose weight. Will monitor.  - Semaglutide,0.25 or 0.5MG/DOS, (OZEMPIC, 0.25 OR 0.5 MG/DOSE,) 2 MG/1.5ML SOPN; Inject 0.5 mg into the skin once a week.  Dispense: 15 mL; Refill: 3  3. Dysuria Patient experiencing increased sensation of bladder fullness and having to strain to empty her bladder.  Routine UA done  in office today.  There is a trace of white blood cells present but no other abnormalities.  Patient will contact neurologist this has been a symptom of her MS. - POCT URINALYSIS DIP (CLINITEK)  4. Multiple sclerosis ([place]) Patient to contact neurologist due to worsening symptoms.  She has already contacted neurology  team aware of ongoing symptoms.  Problem List Items Addressed This Visit       Endocrine   Impaired fasting glucose - Primary   Relevant Medications   Semaglutide,0.25 or 0.5MG/DOS, (OZEMPIC, 0.25 OR 0.5 MG/DOSE,) 2 MG/1.5ML SOPN     Nervous and Auditory   Multiple sclerosis ([facility])     Other   Body mass index (BMI) of 35.0-35.9 in adult   Relevant Medications   Semaglutide,0.25 or 0.5MG/DOS, (OZEMPIC, 0.25 OR 0.5 MG/DOSE,) 2 MG/1.5ML SOPN   Dysuria   Relevant Orders   POCT URINALYSIS DIP (CLINITEK) (Completed)    Meds ordered this encounter  Medications   DISCONTD: Liraglutide -Weight Management (SAXENDA) 18 MG/3ML SOPN    Sig: Inject 0.6 mg into the skin once daily for 1 wk. Then increase dose by 0.6 mg/day every 7 days to target of 3 mg/day.    Dispense:  15 mL    Refill:  2    Order Specific Question:   Supervising Provider    Answer:   Beatrice Lecher D [[id number]]   Semaglutide,0.25 or 0.5MG/DOS, (OZEMPIC, 0.25 OR 0.5 MG/DOSE,) 2 MG/1.5ML SOPN    Sig: Inject 0.5 mg into the skin once a week.    Dispense:  15 mL    Refill:  3    [facility] rx sent in error. Please fill as ozempic 0.39m weekly    Order Specific Question:   Supervising Provider    Answer:   MBeatrice LecherD [[id number]]     Follow-up: Return in 3 months (on 10/13/2021).    HRonnell Freshwater NP  This note was dictated using [employer] analyst Rapid proofreading was performed to expedite the delivery of the information. Despite proofreading, phonetic errors will occur which are common with this voice recognition software. Please take this into consideration. If there are any concerns, please contact our office.

## 2021-07-14 ENCOUNTER — Other Ambulatory Visit: Payer: Self-pay | Admitting: Neurology

## 2021-07-14 DIAGNOSIS — N319 Neuromuscular dysfunction of bladder, unspecified: Secondary | ICD-10-CM

## 2021-07-17 ENCOUNTER — Other Ambulatory Visit (HOSPITAL_COMMUNITY): Payer: Self-pay

## 2021-07-17 ENCOUNTER — Telehealth: Payer: Self-pay | Admitting: Neurology

## 2021-07-17 NOTE — Telephone Encounter (Signed)
Sent to [facility] ph # [phone].

## 2021-07-20 ENCOUNTER — Other Ambulatory Visit (HOSPITAL_COMMUNITY): Payer: Self-pay

## 2021-07-26 ENCOUNTER — Telehealth: Payer: 59

## 2021-07-26 ENCOUNTER — Telehealth: Payer: Self-pay | Admitting: Neurology

## 2021-07-26 ENCOUNTER — Telehealth: Payer: 59 | Admitting: Family Medicine

## 2021-07-26 DIAGNOSIS — L989 Disorder of the skin and subcutaneous tissue, unspecified: Secondary | ICD-10-CM

## 2021-07-26 NOTE — Progress Notes (Signed)
[place]   Needs to be seen in person for eval of new skin changes. HX of MS - possible auto immune issue with this as well.   Pt verbalized understanding.

## 2021-07-26 NOTE — Telephone Encounter (Signed)
Pt called states she has some hard lumps on the top of her left foot says they hurt to touch and they have some discoloration. Pt not sure if this is MS related. Pt requesting a call back.

## 2021-07-26 NOTE — Telephone Encounter (Signed)
Called the patient back.  Patient developed hard breast like marks on her left foot that are painful to touch.  1 is bigger than the others that are on her foot.  She states there is discoloration present and is uncomfortable.  Patient denies any injury to the foot.  Patient completed an E-visit and was advised to make sure this is not autoimmune related. Advised the patient this is not likely MS related and that she needs to reach out to her primary care doctor to actually have an evaluation of the foot in person.  Stated that I would still run this information by Dr. Felecia Shelling to make sure he does not have any thoughts on the matter and will contact the patient if he has any recommendations otherwise patient should reach out to primary care.  Patient verbalized understanding and will wait for my call.

## 2021-07-26 NOTE — Telephone Encounter (Signed)
Called the pt back and there was no answer. Left a message advising that the pt Dr Felecia Shelling reviewed the description of her concerns and in reviewing her medication and list, he does not feel this is MS related. I advised the pt to contact her pcp about getting a in person visit to complete an evaluation in person.

## 2021-07-27 ENCOUNTER — Ambulatory Visit: Payer: 59 | Admitting: Nurse Practitioner

## 2021-07-27 ENCOUNTER — Encounter: Payer: Self-pay | Admitting: Nurse Practitioner

## 2021-07-27 ENCOUNTER — Other Ambulatory Visit (HOSPITAL_COMMUNITY): Payer: Self-pay

## 2021-07-27 VITALS — BP 109/74 | HR 76 | Temp 98.1°F | Ht 69.0 in | Wt 236.9 lb

## 2021-07-27 DIAGNOSIS — L739 Follicular disorder, unspecified: Secondary | ICD-10-CM | POA: Diagnosis not present

## 2021-07-27 DIAGNOSIS — R21 Rash and other nonspecific skin eruption: Secondary | ICD-10-CM

## 2021-07-27 MED ORDER — TRIAMCINOLONE ACETONIDE 0.1 % EX OINT
1.0000 "application " | TOPICAL_OINTMENT | Freq: Two times a day (BID) | CUTANEOUS | 0 refills | Status: DC
  Filled 2021-07-27: qty 80, 25d supply, fill #0

## 2021-07-27 MED ORDER — DOXYCYCLINE HYCLATE 100 MG PO TABS
100.0000 mg | ORAL_TABLET | Freq: Two times a day (BID) | ORAL | 0 refills | Status: DC
  Filled 2021-07-27: qty 20, 10d supply, fill #0

## 2021-07-27 NOTE — Progress Notes (Signed)
Acute Office Visit  Subjective:    Patient ID: Ashley Reed, female    DOB: 2001-01-27, 20 y.o.   MRN: [id number]  Chief Complaint  Patient presents with   Cyst    The patient states that for past 1.5 weeks, she has noted a swollen, blistering rash on the top of the left foot. States that after being on her feet for a while and after work this gets worse. It also gets more severe after being in the shower. She states that this is causing pain and swelling in her left foot. Feels like it aches all the way down into the bones of her feet. She states that the lesions themselves have gotten larger and there seem to be more of them. She denies drainage from any of the lesions.   Rash This is a recurrent problem. The current episode started 1 to 4 weeks ago. The problem has been gradually worsening since onset. The affected locations include the left foot. The rash is characterized by blistering, redness, swelling and pain. She was exposed to nothing. Associated symptoms include joint pain. Pertinent negatives include no congestion, cough, diarrhea, fatigue, fever, rhinorrhea, shortness of breath, sore throat or vomiting. Past treatments include nothing.    Past Medical History:  Diagnosis Date   Asthma    MS (multiple sclerosis) ([place]) 01/07/2021    Past Surgical History:  Procedure Laterality Date   BREAST REDUCTION SURGERY      Family History  Problem Relation Age of Onset   Fibromyalgia Mother    Bipolar disorder Father    Asthma Brother    Asthma Brother    Asthma Brother    Mental illness Brother     Social History   Socioeconomic History   Marital status: Single    Spouse name: Not on file   Number of children: Not on file   Years of education: Not on file   Highest education level: Bachelor's degree (e.g., BA, AB, BS)  Occupational History   Not on file  Tobacco Use   Smoking status: Never   Smokeless tobacco: Never  Vaping Use   Vaping Use: Never used   Substance and Sexual Activity   Alcohol use: Yes    Comment: on occasion   Drug use: Not Currently   Sexual activity: Not on file  Other Topics Concern   Not on file  Social History Narrative   Lives with mother   Right handed   Caffeine: 1-2 coffee in a week   Social Determinants of Health   Financial Resource Strain: Not on file  Food Insecurity: Not on file  Transportation Needs: Not on file  Physical Activity: Not on file  Stress: Not on file  Social Connections: Not on file  Intimate Partner Violence: Not on file    Outpatient Medications Prior to Visit  Medication Sig Dispense Refill   adapalene (DIFFERIN) 0.1 % cream APPLY PEA SIZED AMOUNT TO FINGER AND SPREAD THIN LAYER TO ACNE AREAS ONCE A DAY IN MORNING     albuterol (PROVENTIL HFA;VENTOLIN HFA) 108 (90 Base) MCG/ACT inhaler Inhale 2 puffs 15 minutes prior to exercise and every 4 hours prn cough, wheeze with asthma flare     cyclobenzaprine (FLEXERIL) 10 MG tablet Take 1 tablet (10 mg total) by mouth 3 (three) times daily as needed for muscle spasms. 30 tablet 1   diclofenac sodium (VOLTAREN) 1 % GEL Apply to large joint area up to three times a day as needed  2 Tube 1   ergocalciferol (VITAMIN D2) 1.25 MG (50000 UT) capsule Take 1 capsule (50,000 Units total) by mouth once a week. 12 capsule 1   Fluticasone-Salmeterol (ADVAIR) 250-50 MCG/DOSE AEPB INHALE 1 PUFF AS DIRECTED TWICE A DAY 60 each 2   gabapentin (NEURONTIN) 300 MG capsule Take 1 capsule (300 mg total) by mouth 3 (three) times daily. 90 capsule 11   LARIN FE 1/20 1-20 MG-MCG tablet Take 1 tablet by mouth daily.  4   meloxicam (MOBIC) 15 MG tablet Take 1 tablet (15 mg total) by mouth daily. 30 tablet 3   modafinil (PROVIGIL) 200 MG tablet Take 1 tablet (200 mg total) by mouth daily. 30 tablet 5   naproxen (NAPROSYN) 500 MG tablet Take 1 tablet (500 mg total) by mouth 2 (two) times daily with a meal. (Patient taking differently: Take 500 mg by mouth 2 (two)  times daily as needed.) 60 tablet 1   norethindrone-ethinyl estradiol (LARIN 1/20) 1-20 MG-MCG tablet Take 1 tablet by mouth daily. 84 tablet 1   ondansetron (ZOFRAN) 4 MG tablet Take 1 tablet (4 mg total) by mouth every 8 (eight) hours as needed for nausea or vomiting. 20 tablet 5   prednisoLONE acetate (PRED FORTE) 1 % ophthalmic suspension Place 1 drop into the right eye 4 (four) times daily. (Patient taking differently: Place 1 drop into the right eye 4 (four) times daily as needed.) 10 mL 0   Semaglutide,0.25 or 0.5MG/DOS, (OZEMPIC, 0.25 OR 0.5 MG/DOSE,) 2 MG/1.5ML SOPN Inject 0.5 mg into the skin once a week. 15 mL 3   tiZANidine (ZANAFLEX) 2 MG tablet Take 1-2 tablets (2-4 mg total) by mouth every 6 (six) hours as needed for muscle spasms. 60 tablet 1   zonisamide (ZONEGRAN) 100 MG capsule Take 1 capsule (100 mg total) by mouth daily. 30 capsule 11   No facility-administered medications prior to visit.    No Known Allergies  Review of Systems  Constitutional:  Negative for activity change, appetite change, chills, fatigue and fever.  HENT:  Negative for congestion, postnasal drip, rhinorrhea, sinus pressure, sinus pain, sneezing and sore throat.   Eyes: Negative.   Respiratory:  Negative for cough, chest tightness, shortness of breath and wheezing.   Cardiovascular:  Negative for chest pain and palpitations.  Gastrointestinal:  Negative for abdominal pain, constipation, diarrhea, nausea and vomiting.  Endocrine: Negative for cold intolerance, heat intolerance, polydipsia and polyuria.  Genitourinary:  Negative for dyspareunia, dysuria, flank pain, frequency and urgency.  Musculoskeletal:  Positive for joint pain. Negative for arthralgias, back pain and myalgias.  Skin:  Positive for rash.       She has noted several raised, round, tender cyst-like lesions on the top of her left foot. Some are getting larger and she is getting more and more of them . Foot is swollen and the foot is  tender.   Allergic/Immunologic: Negative for environmental allergies.  Neurological:  Negative for dizziness, weakness and headaches.  Hematological:  Negative for adenopathy.  Psychiatric/Behavioral:  The patient is not nervous/anxious.       Objective:    Physical Exam Vitals and nursing note reviewed.  Constitutional:      Appearance: Normal appearance. She is well-developed.  HENT:     Head: Normocephalic.  Eyes:     Pupils: Pupils are equal, round, and reactive to light.  Cardiovascular:     Rate and Rhythm: Normal rate and regular rhythm.     Pulses: Normal pulses.  Heart sounds: Normal heart sounds.  Pulmonary:     Effort: Pulmonary effort is normal.     Breath sounds: Normal breath sounds.  Abdominal:     Palpations: Abdomen is soft.  Musculoskeletal:        General: Normal range of motion.     Cervical back: Normal range of motion and neck supple.       Feet:  Lymphadenopathy:     Cervical: No cervical adenopathy.  Skin:    General: Skin is warm and dry.     Capillary Refill: Capillary refill takes less than 2 seconds.  Neurological:     General: No focal deficit present.     Mental Status: She is alert and oriented to person, place, and time.  Psychiatric:        Mood and Affect: Mood normal.        Behavior: Behavior normal.        Thought Content: Thought content normal.        Judgment: Judgment normal.    Today's Vitals   07/27/21 0819  BP: 109/74  Pulse: 76  Temp: 98.1 F (36.7 C)  SpO2: 100%  Weight: 236 lb 14.4 oz (107.5 kg)  Height: _0  (1.753 m)   Body mass index is 34.98 kg/m.   Wt Readings from Last 3 Encounters:  07/27/21 236 lb 14.4 oz (107.5 kg)  07/13/21 241 lb (109.3 kg)  05/30/21 239 lb 11.2 oz (108.7 kg)    Health Maintenance Due  Topic Date Due   Pneumococcal Vaccine 73-28 Years old (1 - PCV) Never done   HIV Screening  Never done   PAP SMEAR-Modifier  Never done   COVID-19 Vaccine (4 - Booster for Pfizer series)  08/19/2020    There are no preventive care reminders to display for this patient.   Lab Results  Component Value Date   TSH 2.440 05/31/2021   Lab Results  Component Value Date   WBC 5.8 05/31/2021   HGB 13.8 05/31/2021   HCT 40.6 05/31/2021   MCV 91 05/31/2021   PLT 311 05/31/2021   Lab Results  Component Value Date   NA 138 05/31/2021   K 4.4 05/31/2021   CO2 22 05/31/2021   GLUCOSE 74 05/31/2021   BUN 12 05/31/2021   CREATININE 0.85 05/31/2021   BILITOT 0.6 01/01/2021   ALKPHOS 54 01/01/2021   AST 13 (L) 01/01/2021   ALT 10 01/01/2021   PROT 6.7 01/01/2021   ALBUMIN 4.1 01/01/2021   CALCIUM 8.9 05/31/2021   ANIONGAP 8 01/01/2021   EGFR 99 05/31/2021   No results found for: CHOL No results found for: HDL No results found for: LDLCALC No results found for: TRIG No results found for: CHOLHDL No results found for: HGBA1C     Assessment & Plan:  1. Rash and nonspecific skin eruption Unclear etiology but will treat as bacterial folliculitis. May apply triamcinolone ointment twice daily to all effected areas twice daily as needed for itching and irritation.  - triamcinolone ointment (KENALOG) 0.1 %; Apply 1 application topically 2 (two) times daily.  Dispense: 80 g; Refill: 0  2. Folliculitis Treat with doxycycline 15m twice daily for next 10 days. Advised patient to contact the office if symptoms worsen or do not improve in next 4 to 5 days. She voiced understanding and agreement.  - doxycycline (VIBRA-TABS) 100 MG tablet; Take 1 tablet (100 mg total) by mouth 2 (two) times daily.  Dispense: 20 tablet; Refill: 0 .  Problem List Items Addressed This Visit       Musculoskeletal and Integument   Rash and nonspecific skin eruption - Primary   Relevant Medications   triamcinolone ointment (KENALOG) 0.1 %   Folliculitis   Relevant Medications   doxycycline (VIBRA-TABS) 100 MG tablet     Meds ordered this encounter  Medications   doxycycline (VIBRA-TABS)  100 MG tablet    Sig: Take 1 tablet (100 mg total) by mouth 2 (two) times daily.    Dispense:  20 tablet    Refill:  0    Order Specific Question:   Supervising Provider    Answer:   Beatrice Lecher D [[id number]]   triamcinolone ointment (KENALOG) 0.1 %    Sig: Apply 1 application topically 2 (two) times daily.    Dispense:  80 g    Refill:  0    Order Specific Question:   Supervising Provider    Answer:   Beatrice Lecher D [[id number]]     Ronnell Freshwater, NP

## 2021-07-31 ENCOUNTER — Encounter: Payer: Self-pay | Admitting: Nurse Practitioner

## 2021-07-31 ENCOUNTER — Other Ambulatory Visit (HOSPITAL_COMMUNITY): Payer: Self-pay

## 2021-07-31 ENCOUNTER — Other Ambulatory Visit: Payer: Self-pay | Admitting: Nurse Practitioner

## 2021-07-31 DIAGNOSIS — L739 Follicular disorder, unspecified: Secondary | ICD-10-CM

## 2021-07-31 DIAGNOSIS — R21 Rash and other nonspecific skin eruption: Secondary | ICD-10-CM

## 2021-07-31 MED ORDER — METHYLPREDNISOLONE 4 MG PO TBPK
ORAL_TABLET | ORAL | 0 refills | Status: DC
  Filled 2021-07-31: qty 21, 6d supply, fill #0

## 2021-08-01 ENCOUNTER — Other Ambulatory Visit (HOSPITAL_COMMUNITY): Payer: Self-pay

## 2021-08-01 ENCOUNTER — Telehealth: Payer: 59 | Admitting: Physician Assistant

## 2021-08-01 DIAGNOSIS — N760 Acute vaginitis: Secondary | ICD-10-CM

## 2021-08-01 MED ORDER — FLUCONAZOLE 150 MG PO TABS
ORAL_TABLET | ORAL | 0 refills | Status: DC
  Filled 2021-08-01: qty 2, 4d supply, fill #0

## 2021-08-01 NOTE — Progress Notes (Signed)

## 2021-08-04 DIAGNOSIS — N312 Flaccid neuropathic bladder, not elsewhere classified: Secondary | ICD-10-CM | POA: Diagnosis not present

## 2021-08-04 DIAGNOSIS — R3916 Straining to void: Secondary | ICD-10-CM | POA: Diagnosis not present

## 2021-08-07 ENCOUNTER — Other Ambulatory Visit (HOSPITAL_COMMUNITY): Payer: Self-pay

## 2021-08-21 ENCOUNTER — Other Ambulatory Visit (HOSPITAL_COMMUNITY): Payer: Self-pay

## 2021-08-21 ENCOUNTER — Encounter: Payer: Self-pay | Admitting: Neurology

## 2021-08-22 ENCOUNTER — Encounter: Payer: Self-pay | Admitting: Neurology

## 2021-08-22 ENCOUNTER — Ambulatory Visit: Payer: 59

## 2021-08-22 ENCOUNTER — Other Ambulatory Visit: Payer: Self-pay

## 2021-08-22 ENCOUNTER — Other Ambulatory Visit (HOSPITAL_COMMUNITY): Payer: Self-pay

## 2021-08-22 ENCOUNTER — Ambulatory Visit: Payer: 59 | Admitting: Neurology

## 2021-08-22 VITALS — BP 142/82 | HR 81 | Ht 69.0 in | Wt 231.0 lb

## 2021-08-22 DIAGNOSIS — G43009 Migraine without aura, not intractable, without status migrainosus: Secondary | ICD-10-CM | POA: Diagnosis not present

## 2021-08-22 DIAGNOSIS — G35 Multiple sclerosis: Secondary | ICD-10-CM | POA: Diagnosis not present

## 2021-08-22 DIAGNOSIS — M542 Cervicalgia: Secondary | ICD-10-CM | POA: Diagnosis not present

## 2021-08-22 DIAGNOSIS — R208 Other disturbances of skin sensation: Secondary | ICD-10-CM | POA: Diagnosis not present

## 2021-08-22 DIAGNOSIS — Z79899 Other long term (current) drug therapy: Secondary | ICD-10-CM | POA: Diagnosis not present

## 2021-08-22 DIAGNOSIS — Z23 Encounter for immunization: Secondary | ICD-10-CM

## 2021-08-22 MED ORDER — TIZANIDINE HCL 2 MG PO TABS
2.0000 mg | ORAL_TABLET | Freq: Every day | ORAL | 5 refills | Status: DC
  Filled 2021-08-22: qty 60, 30d supply, fill #0
  Filled 2021-10-15: qty 60, 30d supply, fill #1
  Filled 2021-11-19: qty 60, 30d supply, fill #2
  Filled 2021-12-17: qty 60, 30d supply, fill #3
  Filled 2022-01-24: qty 60, 30d supply, fill #4
  Filled 2022-02-28: qty 60, 30d supply, fill #5

## 2021-08-22 MED ORDER — TAMSULOSIN HCL 0.4 MG PO CAPS
0.4000 mg | ORAL_CAPSULE | Freq: Every day | ORAL | 5 refills | Status: DC
  Filled 2021-08-22: qty 30, 30d supply, fill #0

## 2021-08-22 MED ORDER — ZONISAMIDE 100 MG PO CAPS
200.0000 mg | ORAL_CAPSULE | Freq: Every day | ORAL | 3 refills | Status: DC
  Filled 2021-08-22: qty 180, 90d supply, fill #0
  Filled 2021-08-22: qty 180, fill #0

## 2021-08-22 NOTE — Progress Notes (Signed)
[facility]  PATIENT: Ashley Reed DOB: [birth year]  REFERRING DOCTOR OR PCP:  Nathaneil Canary, MD SOURCE: Patient, notes from [facility], imaging and lab results, MRI images personally reviewed  _________________________________   HISTORICAL  CHIEF COMPLAINT:  Chief Complaint  Patient presents with   Follow-up    RM 1.  Last seen 04/26/21. Here for evaluation of ongoing migraine x1 week.    Ashley, Reed a 20 y.o. woman with multiple sclerosis.  Update 08/22/2021: Her first Ocrevus infusion were July 2022 and next one will be October 12, 2021.    She tolerated it ok --- mild nausea.    She is noting more migraines and leg symptoms over the past few months.  She stumbles more but has no falls.   The legs are symmetric.    She has some leg pain developing at night awakening her.      She has more urinary dysfunction.   She has hesitancy and trouble with incomplete emptying.   She has not tried tamsulosin or Rapaflo.     Cognition is fine.   She has fatigue but is less sleepy on modafinil.     She sleeps well at night.  She denies mood issues.    She has had daily more severe headaches over the last month.  The worst pain is at the occiput and radiates forward.   MS HISTORY: She is a 20 year old woman who had numbness from the waist down lasting 1 month in January and February.    She did not have any balance issues.   She also had mild urinary retention.   In April, she had the onset of numbness form the breast down and also had poor balance.   With symptoms persisting she saw the ED at [facility] break but MRI was not up.   She returned to school at [employer] and had an MRI performed 01/06/2021.  This was about 3 weeks after the onset of symptoms.   She was admitted to [facility] and di 7 days of IV Solu-medrol.  Symptoms improved.    She saw Dr. Concha Pyo.   She was started on Ocrevus.  Her last infusion was March 30, 2021.  She  noticed issues with stomach pain, nausea lasting a couple weeks from the infusion.  Additionally she had some hair loss since the last infusion.  Currently, she is doing well.   She is walking well and has no difficulty with balance, strength or bladder issues.  Although the numbness below her chest has improved, she is now experiencing dysesthesias and allodynia.  She is noting facial pain on the left and mildly blurred vision OS.   Color vision is fine.     She has had lower back pain x many years.   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   She has had LBP and occasional leg pain.   A few times she has had her back lock up due to muscle spasms.    This improved with muscle relaxant.   She also has migraine headaches about twice a week.  She takes Excedrin when 1 occurs.  She has never been on prophylactic therapy for the migraines.  Mood and cognition are doing well.  Imaging personally reviewed (and shown to patient): MRI of the brain 01/06/2021 shows multiple T2/FLAIR hyperintense foci, predominantly in the periventricular white matter including the callosal septal fibers.  1 focus is also  in the frontal juxtacortical white matter on the left.  None of the foci enhance.    Cervical, thoracic and lumbar spine MRI 01/06/2021 showed a large T2 hyperintense field is adjacent to T6.  It showed faint enhancement degenerative changes are noted at L3-L4, L4-L5 and L5-S1.[name]  There is spinal stenosis at L3-L4 .      Pertinent laboratory tests: 01/07/2021: CSF showed 3 oligoclonal bands.  Myelin basic protein was elevated. 02/10/2021: Hepatitis panel was negative.  Vitamin D was mildly low.  TSH was normal REVIEW OF SYSTEMS: Constitutional: No fevers, chills, sweats, or change in appetite Eyes: No visual changes, double vision, eye pain Ear, nose and throat: No hearing loss, ear pain, nasal congestion, sore throat Cardiovascular: No chest pain, palpitations Respiratory:   No shortness of breath at rest or with exertion.   No wheezes GastrointestinaI: No nausea, vomiting, diarrhea, abdominal pain, fecal incontinence Genitourinary:  No dysuria, urinary retention or frequency.  No nocturia. Musculoskeletal:  No neck pain, back pain Integumentary: No rash, pruritus, skin lesions Neurological: as above Psychiatric: No depression at this time.  No anxiety Endocrine: No palpitations, diaphoresis, change in appetite, change in weigh or increased thirst Hematologic/Lymphatic:  No anemia, purpura, petechiae. Allergic/Immunologic: No itchy/runny eyes, nasal congestion, recent allergic reactions, rashes  ALLERGIES: No Known Allergies  HOME MEDICATIONS:  Current Outpatient Medications:    adapalene (DIFFERIN) 0.1 % cream, APPLY PEA SIZED AMOUNT TO FINGER AND SPREAD THIN LAYER TO ACNE AREAS ONCE A DAY IN MORNING, Disp: , Rfl:    albuterol (PROVENTIL HFA;VENTOLIN HFA) 108 (90 Base) MCG/ACT inhaler, Inhale 2 puffs 15 minutes prior to exercise and every 4 hours prn cough, wheeze with asthma flare, Disp: , Rfl:    B Complex Vitamins (VITAMIN B COMPLEX PO), Take by mouth., Disp: , Rfl:    cetirizine (ZYRTEC) 10 MG tablet, Take 10 mg by mouth daily., Disp: , Rfl:    CRANBERRY CONCENTRATE PO, Take by mouth., Disp: , Rfl:    Cyanocobalamin (VITAMIN B-12 PO), Take by mouth., Disp: , Rfl:    diclofenac sodium (VOLTAREN) 1 % GEL, Apply to large joint area up to three times a day as needed, Disp: 2 Tube, Rfl: 1   ergocalciferol (VITAMIN D2) 1.25 MG (50000 UT) capsule, Take 1 capsule (50,000 Units total) by mouth once a week., Disp: 12 capsule, Rfl: 1   Fluticasone-Salmeterol (ADVAIR) 250-50 MCG/DOSE AEPB, INHALE 1 PUFF AS DIRECTED TWICE A DAY, Disp: 60 each, Rfl: 2   gabapentin (NEURONTIN) 300 MG capsule, Take 1 capsule (300 mg total) by mouth 3 (three) times daily., Disp: 90 capsule, Rfl: 11   LARIN FE 1/20 1-20 MG-MCG tablet, Take 1 tablet by mouth daily., Disp: , Rfl: 4    meloxicam (MOBIC) 15 MG tablet, Take 1 tablet (15 mg total) by mouth daily., Disp: 30 tablet, Rfl: 3   modafinil (PROVIGIL) 200 MG tablet, Take 1 tablet (200 mg total) by mouth daily., Disp: 30 tablet, Rfl: 5   norethindrone-ethinyl estradiol (LARIN 1/20) 1-20 MG-MCG tablet, Take 1 tablet by mouth daily., Disp: 84 tablet, Rfl: 1   ondansetron (ZOFRAN) 4 MG tablet, Take 1 tablet (4 mg total) by mouth every 8 (eight) hours as needed for nausea or vomiting., Disp: 20 tablet, Rfl: 5   prednisoLONE acetate (PRED FORTE) 1 % ophthalmic suspension, Place 1 drop into the right eye 4 (four) times daily. (Patient taking differently: Place 1 drop into the right eye 4 (four) times daily as needed.), Disp: 10 mL,  Rfl: 0   Semaglutide,0.25 or 0.5MG /DOS, (OZEMPIC, 0.25 OR 0.5 MG/DOSE,) 2 MG/1.5ML SOPN, Inject 0.5 mg into the skin once a week., Disp: 15 mL, Rfl: 3   tamsulosin (FLOMAX) 0.4 MG CAPS capsule, Take 1 capsule (0.4 mg total) by mouth daily., Disp: 30 capsule, Rfl: 5   triamcinolone ointment (KENALOG) 0.1 %, Apply 1 application topically 2 (two) times daily., Disp: 80 g, Rfl: 0   tiZANidine (ZANAFLEX) 2 MG tablet, Take 1-2 tablets (2-4 mg total) by mouth at bedtime for spasticity, Disp: 60 tablet, Rfl: 5   zonisamide (ZONEGRAN) 100 MG capsule, Take 2 capsules (200 mg total) by mouth at bedtime., Disp: 180 capsule, Rfl: 3  PAST MEDICAL HISTORY: Past Medical History:  Diagnosis Date   Asthma    MS (multiple sclerosis) ([place]) 01/07/2021    PAST SURGICAL HISTORY: Past Surgical History:  Procedure Laterality Date   BREAST REDUCTION SURGERY      FAMILY HISTORY: Family History  Problem Relation Age of Onset   Fibromyalgia Mother    Bipolar disorder Father    Asthma Brother    Asthma Brother    Asthma Brother    Mental illness Brother     SOCIAL HISTORY:  Social History   Socioeconomic History   Marital status: Single    Spouse name: Not on file   Number of children: Not on file   Years of  education: Not on file   Highest education level: Bachelor's degree (e.g., BA, AB, BS)  Occupational History   Not on file  Tobacco Use   Smoking status: Never   Smokeless tobacco: Never  Vaping Use   Vaping Use: Never used  Substance and Sexual Activity   Alcohol use: Yes    Comment: on occasion   Drug use: Not Currently   Sexual activity: Not on file  Other Topics Concern   Not on file  Social History Narrative   Lives with mother   Right handed   Caffeine: 1-2 coffee in a week   Social Determinants of Health   Financial Resource Strain: Not on file  Food Insecurity: Not on file  Transportation Needs: Not on file  Physical Activity: Not on file  Stress: Not on file  Social Connections: Not on file  Intimate Partner Violence: Not on file     PHYSICAL EXAM  Vitals:   08/22/21 0912  BP: (!) 142/82  Pulse: 81  Weight: 231 lb (104.8 kg)  Height: 5\' 9"  (1.753 m)    Body mass index is 34.11 kg/m.  Vision Screening   Right eye Left eye Both eyes  Without correction 20/70 20/200 20/70  With correction       General: The patient is well-developed and well-nourished and in no acute distress  HEENT:  Head is [place]/AT.  Sclera are anicteric.     Skin: Extremities are without rash or  edema.  Musculoskeletal: Back is tender over the occiput.  Range of motion was normal.  Neurologic Exam  Mental status: The patient is alert and oriented x 3 at the time of the examination. The patient has apparent normal recent and remote memory, with an apparently normal attention span and concentration ability.   Speech is normal.  Cranial nerves: Extraocular movements are full.  Facial strength and sesation were noral No obvious hearing deficits are noted.  Motor:  Muscle bulk is normal.   Tone is normal. Strength is  5 / 5 in all 4 extremities.   Sensory: Sensory testing is  intact to vibration sensation in all 4 extremities.  She has allodynia to cold temperature and altered  sensation to touch below the T7 level.  Coordination: Cerebellar testing reveals good finger-nose-finger and heel-to-shin bilaterally.  Gait and station: Station is normal.   Gait is normal. Tandem gait is normal. Romberg is negative.   Reflexes: Deep tendon reflexes are symmetric and normal bilaterally.   Plantar responses are flexor.    DIAGNOSTIC DATA (LABS, IMAGING, TESTING) - I reviewed patient records, labs, notes, testing and imaging myself where available.  Lab Results  Component Value Date   WBC 5.8 05/31/2021   HGB 13.8 05/31/2021   HCT 40.6 05/31/2021   MCV 91 05/31/2021   PLT 311 05/31/2021      Component Value Date/Time   NA 138 05/31/2021 0823   K 4.4 05/31/2021 0823   CL 100 05/31/2021 0823   CO2 22 05/31/2021 0823   GLUCOSE 74 05/31/2021 0823   GLUCOSE 88 01/01/2021 1025   BUN 12 05/31/2021 0823   CREATININE 0.85 05/31/2021 0823   CALCIUM 8.9 05/31/2021 0823   PROT 6.7 01/01/2021 1025   ALBUMIN 4.1 01/01/2021 1025   AST 13 (L) 01/01/2021 1025   ALT 10 01/01/2021 1025   ALKPHOS 54 01/01/2021 1025   BILITOT 0.6 01/01/2021 1025   GFRNONAA >60 01/01/2021 1025   No results found for: CHOL, HDL, LDLCALC, LDLDIRECT, TRIG, CHOLHDL No results found for: HGBA1C Lab Results  Component Value Date   TYOMAYOK59 977 05/31/2021   Lab Results  Component Value Date   TSH 2.440 05/31/2021       ASSESSMENT AND PLAN  Multiple sclerosis ([place]) - Plan: IgG, IgA, IgM, CBC with Differential/Platelet  High risk medication use - Plan: IgG, IgA, IgM, CBC with Differential/Platelet  Migraine without aura and without status migrainosus, not intractable  Dysesthesia  Neck pain   Continue Ocrevus Bilateral splenius capitus TPI with 80 mg DepoMedrol in [place] using sterile technique.   She tolerated the procedure well and there were no complications.   Increase zonisamide and reinstate tizanidine to help with the headaches. Tamsulosin for urinary hesitancy Return  in 6 months or sooner if there are new or worsening neurologic symptoms.  Lulamae Skorupski A. Felecia Shelling, MD, [facility] 41/12/2393, 3:20 PM Certified in Neurology, Clinical Neurophysiology, Sleep Medicine and Neuroimaging  [facility] [address] [place], [place] [address] [phone]

## 2021-08-23 LAB — CBC WITH DIFFERENTIAL/PLATELET
Basophils Absolute: 0 10*3/uL (ref 0.0–0.2)
Basos: 0 %
EOS (ABSOLUTE): 0.1 10*3/uL (ref 0.0–0.4)
Eos: 1 %
Hematocrit: 43.3 % (ref 34.0–46.6)
Hemoglobin: 15 g/dL (ref 11.1–15.9)
Immature Grans (Abs): 0 10*3/uL (ref 0.0–0.1)
Immature Granulocytes: 0 %
Lymphocytes Absolute: 1.9 10*3/uL (ref 0.7–3.1)
Lymphs: 32 %
MCH: 31.4 pg (ref 26.6–33.0)
MCHC: 34.6 g/dL (ref 31.5–35.7)
MCV: 91 fL (ref 79–97)
Monocytes Absolute: 0.4 10*3/uL (ref 0.1–0.9)
Monocytes: 7 %
Neutrophils Absolute: 3.5 10*3/uL (ref 1.4–7.0)
Neutrophils: 60 %
Platelets: 285 10*3/uL (ref 150–450)
RBC: 4.77 x10E6/uL (ref 3.77–5.28)
RDW: 12.8 % (ref 11.7–15.4)
WBC: 5.8 10*3/uL (ref 3.4–10.8)

## 2021-08-23 LAB — IGG, IGA, IGM
IgA/Immunoglobulin A, Serum: 110 mg/dL (ref 87–352)
IgG (Immunoglobin G), Serum: 762 mg/dL (ref 586–1602)
IgM (Immunoglobulin M), Srm: 46 mg/dL (ref 26–217)

## 2021-08-28 ENCOUNTER — Other Ambulatory Visit: Payer: Self-pay | Admitting: Neurology

## 2021-08-28 ENCOUNTER — Other Ambulatory Visit (HOSPITAL_COMMUNITY): Payer: Self-pay

## 2021-08-28 MED ORDER — RIZATRIPTAN BENZOATE 10 MG PO TBDP
10.0000 mg | ORAL_TABLET | ORAL | 1 refills | Status: DC | PRN
  Filled 2021-08-28: qty 9, 30d supply, fill #0
  Filled 2021-09-10 – 2021-09-18 (×4): qty 9, 30d supply, fill #1

## 2021-09-04 ENCOUNTER — Other Ambulatory Visit (HOSPITAL_COMMUNITY): Payer: Self-pay

## 2021-09-07 ENCOUNTER — Ambulatory Visit: Payer: 59 | Admitting: Neurology

## 2021-09-12 ENCOUNTER — Other Ambulatory Visit (HOSPITAL_COMMUNITY): Payer: Self-pay

## 2021-09-12 ENCOUNTER — Encounter: Payer: Self-pay | Admitting: Neurology

## 2021-09-13 ENCOUNTER — Telehealth: Payer: Self-pay | Admitting: Neurology

## 2021-09-13 ENCOUNTER — Ambulatory Visit (INDEPENDENT_AMBULATORY_CARE_PROVIDER_SITE_OTHER): Payer: 59

## 2021-09-13 DIAGNOSIS — G43009 Migraine without aura, not intractable, without status migrainosus: Secondary | ICD-10-CM

## 2021-09-13 NOTE — Telephone Encounter (Signed)
Pt presented today to have education on emgality and receive the first sample dose. I reviewed with the patient how to get the medication. I provided the first loading dose to the pt in the LLQ. She tolerated well. Script will be sent to the pharmacy and auth will need to be completed.

## 2021-09-18 ENCOUNTER — Other Ambulatory Visit (HOSPITAL_COMMUNITY): Payer: Self-pay

## 2021-09-19 ENCOUNTER — Other Ambulatory Visit (HOSPITAL_COMMUNITY): Payer: Self-pay

## 2021-09-20 ENCOUNTER — Other Ambulatory Visit: Payer: Self-pay | Admitting: *Deleted

## 2021-09-20 ENCOUNTER — Other Ambulatory Visit (HOSPITAL_COMMUNITY): Payer: Self-pay

## 2021-09-20 ENCOUNTER — Ambulatory Visit: Payer: 59 | Admitting: Neurology

## 2021-09-20 ENCOUNTER — Encounter: Payer: Self-pay | Admitting: Neurology

## 2021-09-20 DIAGNOSIS — G43009 Migraine without aura, not intractable, without status migrainosus: Secondary | ICD-10-CM

## 2021-09-20 MED ORDER — EMGALITY 120 MG/ML ~~LOC~~ SOAJ
120.0000 mg | SUBCUTANEOUS | 11 refills | Status: DC
  Filled 2021-09-20: qty 1, 30d supply, fill #0
  Filled 2021-09-25: qty 1, 28d supply, fill #0
  Filled 2021-10-28: qty 1, 28d supply, fill #1
  Filled 2021-10-31: qty 1, 30d supply, fill #1
  Filled 2021-10-31: qty 1, 28d supply, fill #1
  Filled 2021-11-27: qty 1, 30d supply, fill #2
  Filled 2021-12-25: qty 1, 30d supply, fill #3
  Filled 2022-01-24: qty 1, 30d supply, fill #4
  Filled 2022-02-28: qty 1, 30d supply, fill #5

## 2021-09-21 ENCOUNTER — Other Ambulatory Visit (HOSPITAL_COMMUNITY): Payer: Self-pay

## 2021-09-21 ENCOUNTER — Telehealth: Payer: Self-pay | Admitting: *Deleted

## 2021-09-21 NOTE — Telephone Encounter (Signed)
Submitted PA Emgality on CMM. Key: [id number]. Waiting on determination from [place].

## 2021-09-22 ENCOUNTER — Ambulatory Visit: Payer: 59 | Admitting: Podiatry

## 2021-09-22 ENCOUNTER — Other Ambulatory Visit: Payer: Self-pay

## 2021-09-22 ENCOUNTER — Ambulatory Visit (INDEPENDENT_AMBULATORY_CARE_PROVIDER_SITE_OTHER): Payer: 59

## 2021-09-22 DIAGNOSIS — M7989 Other specified soft tissue disorders: Secondary | ICD-10-CM

## 2021-09-22 DIAGNOSIS — M79672 Pain in left foot: Secondary | ICD-10-CM | POA: Diagnosis not present

## 2021-09-25 ENCOUNTER — Other Ambulatory Visit (HOSPITAL_COMMUNITY): Payer: Self-pay

## 2021-09-25 ENCOUNTER — Telehealth: Payer: Self-pay | Admitting: *Deleted

## 2021-09-25 ENCOUNTER — Encounter: Payer: Self-pay | Admitting: Podiatry

## 2021-09-25 NOTE — Telephone Encounter (Signed)
Called patient to inform of MRI and where it will be done.

## 2021-09-25 NOTE — Telephone Encounter (Signed)
The request has been approved. The authorization is effective for a maximum of 1 fills from 09/25/2021 to 10/25/2021, as long as the member is enrolled in their current health plan. The request was approved as submitted. The request was approved for up to 36mL (two 120mg /mL pens) per 30 days.An additional authorization has been entered for [facility] allowing a maximum of 5 fills with a quantity limit of 47mL (one 120mg /mL pen) per 30 days effective 10/18/2021 through 03/16/2022; please reference authorization [id number]. A written notification letter will follow with additional details.

## 2021-09-25 NOTE — Telephone Encounter (Signed)
Called patient and informed that the MRI will be scheduled  @[facility] ,once the insurance has been pre authorized, they will contact her. She verbalized understanding.

## 2021-09-26 ENCOUNTER — Encounter: Payer: Self-pay | Admitting: Podiatry

## 2021-09-27 ENCOUNTER — Other Ambulatory Visit (HOSPITAL_COMMUNITY): Payer: Self-pay

## 2021-09-27 ENCOUNTER — Other Ambulatory Visit: Payer: Self-pay | Admitting: Podiatry

## 2021-09-27 MED ORDER — ALPRAZOLAM 0.25 MG PO TABS
0.2500 mg | ORAL_TABLET | Freq: Two times a day (BID) | ORAL | 0 refills | Status: DC | PRN
  Filled 2021-09-27: qty 20, 10d supply, fill #0

## 2021-09-27 NOTE — Progress Notes (Signed)
Subjective:  Patient ID: Ashley Reed, female    DOB: 2001/07/05,  MRN: [id number]  Chief Complaint  Patient presents with   Foot Pain    Painful bumps on top of left foot     21 y.o. female presents with the above complaint.  Patient presents with complaint of places on top of her left foot.  She states that she is getting Raised bump that came out of nowhere and has been progressive getting worse.  She states is causing her pain and is hurting her when there is pressure on it.  Is worse after shower.  They get tend to get very red.  She has tried steroid NT bacterial antifungal none of those medication has helped get rid of it.  She states the changes colored from regular rate to more pink-red.  She wanted to get it evaluated she has not seen anyone else prior to seeing me.   Review of Systems: Negative except as noted in the HPI. Denies N/V/F/Ch.  Past Medical History:  Diagnosis Date   Asthma    MS (multiple sclerosis) ([place]) 01/07/2021    Current Outpatient Medications:    adapalene (DIFFERIN) 0.1 % cream, APPLY PEA SIZED AMOUNT TO FINGER AND SPREAD THIN LAYER TO ACNE AREAS ONCE A DAY IN MORNING, Disp: , Rfl:    albuterol (PROVENTIL HFA;VENTOLIN HFA) 108 (90 Base) MCG/ACT inhaler, Inhale 2 puffs 15 minutes prior to exercise and every 4 hours prn cough, wheeze with asthma flare, Disp: , Rfl:    B Complex Vitamins (VITAMIN B COMPLEX PO), Take by mouth., Disp: , Rfl:    cetirizine (ZYRTEC) 10 MG tablet, Take 10 mg by mouth daily., Disp: , Rfl:    CRANBERRY CONCENTRATE PO, Take by mouth., Disp: , Rfl:    Cyanocobalamin (VITAMIN B-12 PO), Take by mouth., Disp: , Rfl:    diclofenac sodium (VOLTAREN) 1 % GEL, Apply to large joint area up to three times a day as needed, Disp: 2 Tube, Rfl: 1   ergocalciferol (VITAMIN D2) 1.25 MG (50000 UT) capsule, Take 1 capsule (50,000 Units total) by mouth once a week., Disp: 12 capsule, Rfl: 1   Fluticasone-Salmeterol (ADVAIR) 250-50 MCG/DOSE AEPB,  INHALE 1 PUFF AS DIRECTED TWICE A DAY, Disp: 60 each, Rfl: 2   gabapentin (NEURONTIN) 300 MG capsule, Take 1 capsule (300 mg total) by mouth 3 (three) times daily., Disp: 90 capsule, Rfl: 11   Galcanezumab-gnlm (EMGALITY) 120 MG/ML SOAJ, Inject 120 mg into the skin every 30 (thirty) days., Disp: 1 mL, Rfl: 11   LARIN FE 1/20 1-20 MG-MCG tablet, Take 1 tablet by mouth daily., Disp: , Rfl: 4   meloxicam (MOBIC) 15 MG tablet, Take 1 tablet (15 mg total) by mouth daily., Disp: 30 tablet, Rfl: 3   modafinil (PROVIGIL) 200 MG tablet, Take 1 tablet (200 mg total) by mouth daily., Disp: 30 tablet, Rfl: 5   norethindrone-ethinyl estradiol (LARIN 1/20) 1-20 MG-MCG tablet, Take 1 tablet by mouth daily., Disp: 84 tablet, Rfl: 1   ondansetron (ZOFRAN) 4 MG tablet, Take 1 tablet (4 mg total) by mouth every 8 (eight) hours as needed for nausea or vomiting., Disp: 20 tablet, Rfl: 5   prednisoLONE acetate (PRED FORTE) 1 % ophthalmic suspension, Place 1 drop into the right eye 4 (four) times daily. (Patient taking differently: Place 1 drop into the right eye 4 (four) times daily as needed.), Disp: 10 mL, Rfl: 0   rizatriptan (MAXALT-MLT) 10 MG disintegrating tablet, Take 1 tablet (  10 mg total) by mouth as needed for migraine. May repeat in 2 hours if needed, Disp: 9 tablet, Rfl: 1   Semaglutide,0.25 or 0.5MG /DOS, (OZEMPIC, 0.25 OR 0.5 MG/DOSE,) 2 MG/1.5ML SOPN, Inject 0.5 mg into the skin once a week., Disp: 15 mL, Rfl: 3   tamsulosin (FLOMAX) 0.4 MG CAPS capsule, Take 1 capsule (0.4 mg total) by mouth daily., Disp: 30 capsule, Rfl: 5   tiZANidine (ZANAFLEX) 2 MG tablet, Take 1-2 tablets (2-4 mg total) by mouth at bedtime for spasticity, Disp: 60 tablet, Rfl: 5   triamcinolone ointment (KENALOG) 0.1 %, Apply 1 application topically 2 (two) times daily., Disp: 80 g, Rfl: 0   zonisamide (ZONEGRAN) 100 MG capsule, Take 2 capsules (200 mg total) by mouth at bedtime., Disp: 180 capsule, Rfl: 3  Social History   Tobacco  Use  Smoking Status Never  Smokeless Tobacco Never    No Known Allergies Objective:  There were no vitals filed for this visit. There is no height or weight on file to calculate BMI. Constitutional Well developed. Well nourished.  Vascular Dorsalis pedis pulses palpable bilaterally. Posterior tibial pulses palpable bilaterally. Capillary refill normal to all digits.  No cyanosis or clubbing noted. Pedal hair growth normal.  Neurologic Normal speech. Oriented to person, place, and time. Epicritic sensation to light touch grossly present bilaterally.  Dermatologic Raised petechiae/red bumps noted across the dorsum of the left foot.  Pain on palpation to the lesions.  No concern for melanoma noted.  Orthopedic: Normal joint ROM without pain or crepitus bilaterally. No visible deformities. No bony tenderness.    Radiographs: 3 views of skeletally mature adult left foot: No soft tissue mass or lesions noted on the x-ray.  No involvement of bone noted.  No bony abnormalities identified Assessment:   1. Left foot pain   2. Soft tissue mass    Plan:  Patient was evaluated and treated and all questions answered.  Left dorsal foot benign skin lesion/soft tissue masses -All questions and concerns were discussed with the patient in extensive detail. -Given the location of the lesion as well as the length of these lesions I believe patient will benefit from an MRI evaluation to evaluate the depth of the lesion.  To me clinically seems likely correlating may be of the venous system.  The lesions are following the venous system.  Given that in the hot shower tends to get more raised this likely has to do with vascular involvement.  However we will get an MRI to further evaluate these lesions.  Ultimately patient may need a biopsy of the skin lesions if there is no confirmation on the MRI we will plan on getting biopsy of the skin lesion. -I discussed this with the patient she states  understanding.  No follow-ups on file.

## 2021-09-29 ENCOUNTER — Ambulatory Visit
Admission: RE | Admit: 2021-09-29 | Discharge: 2021-09-29 | Disposition: A | Discharge status: Home / Self Care | Payer: 59 | Source: Ambulatory Visit | Attending: Podiatry | Admitting: Podiatry

## 2021-09-29 DIAGNOSIS — R2242 Localized swelling, mass and lump, left lower limb: Secondary | ICD-10-CM | POA: Diagnosis not present

## 2021-09-29 DIAGNOSIS — M67472 Ganglion, left ankle and foot: Secondary | ICD-10-CM | POA: Diagnosis not present

## 2021-09-29 DIAGNOSIS — M7989 Other specified soft tissue disorders: Secondary | ICD-10-CM

## 2021-09-29 MED ORDER — GADOBENATE DIMEGLUMINE 529 MG/ML IV SOLN
20.0000 mL | Freq: Once | INTRAVENOUS | Status: AC | PRN
  Administered 2021-09-29: 20 mL via INTRAVENOUS

## 2021-10-04 ENCOUNTER — Ambulatory Visit: Payer: 59 | Admitting: Podiatry

## 2021-10-04 ENCOUNTER — Encounter: Payer: Self-pay | Admitting: Podiatry

## 2021-10-04 ENCOUNTER — Other Ambulatory Visit: Payer: Self-pay

## 2021-10-04 DIAGNOSIS — M7989 Other specified soft tissue disorders: Secondary | ICD-10-CM | POA: Diagnosis not present

## 2021-10-04 DIAGNOSIS — L989 Disorder of the skin and subcutaneous tissue, unspecified: Secondary | ICD-10-CM | POA: Diagnosis not present

## 2021-10-04 DIAGNOSIS — D485 Neoplasm of uncertain behavior of skin: Secondary | ICD-10-CM | POA: Diagnosis not present

## 2021-10-04 NOTE — Progress Notes (Signed)
Subjective:  Patient ID: Ashley Reed, female    DOB: 04-Nov-2000,  MRN: [id number]  Chief Complaint  Patient presents with   Foot Pain    Left foot pain, Soft tissue mass       21 y.o. female presents with the above complaint.  Patient presents with complaint of places on top of her left foot.  She states that she is getting Raised bump that came out of nowhere and has been progressive getting worse.  She states is causing her pain and is hurting her when there is pressure on it.  Is worse after shower.  They get tend to get very red.  She has tried steroid NT bacterial antifungal none of those medication has helped get rid of it.  She states the changes colored from regular rate to more pink-red.  She wanted to get it evaluated she has not seen anyone else prior to seeing me.   Review of Systems: Negative except as noted in the HPI. Denies N/V/F/Ch.  Past Medical History:  Diagnosis Date   Asthma    MS (multiple sclerosis) ([place]) 01/07/2021    Current Outpatient Medications:    adapalene (DIFFERIN) 0.1 % cream, APPLY PEA SIZED AMOUNT TO FINGER AND SPREAD THIN LAYER TO ACNE AREAS ONCE A DAY IN MORNING, Disp: , Rfl:    albuterol (PROVENTIL HFA;VENTOLIN HFA) 108 (90 Base) MCG/ACT inhaler, Inhale 2 puffs 15 minutes prior to exercise and every 4 hours prn cough, wheeze with asthma flare, Disp: , Rfl:    ALPRAZolam (XANAX) 0.25 MG tablet, Take 1 tablet (0.25 mg total) by mouth 2 (two) times daily as needed for anxiety., Disp: 20 tablet, Rfl: 0   B Complex Vitamins (VITAMIN B COMPLEX PO), Take by mouth., Disp: , Rfl:    cetirizine (ZYRTEC) 10 MG tablet, Take 10 mg by mouth daily., Disp: , Rfl:    CRANBERRY CONCENTRATE PO, Take by mouth., Disp: , Rfl:    Cyanocobalamin (VITAMIN B-12 PO), Take by mouth., Disp: , Rfl:    diclofenac sodium (VOLTAREN) 1 % GEL, Apply to large joint area up to three times a day as needed, Disp: 2 Tube, Rfl: 1   ergocalciferol (VITAMIN D2) 1.25 MG (50000 UT)  capsule, Take 1 capsule (50,000 Units total) by mouth once a week., Disp: 12 capsule, Rfl: 1   Fluticasone-Salmeterol (ADVAIR) 250-50 MCG/DOSE AEPB, INHALE 1 PUFF AS DIRECTED TWICE A DAY, Disp: 60 each, Rfl: 2   gabapentin (NEURONTIN) 300 MG capsule, Take 1 capsule (300 mg total) by mouth 3 (three) times daily., Disp: 90 capsule, Rfl: 11   Galcanezumab-gnlm (EMGALITY) 120 MG/ML SOAJ, Inject 120 mg into the skin every 30 (thirty) days., Disp: 1 mL, Rfl: 11   LARIN FE 1/20 1-20 MG-MCG tablet, Take 1 tablet by mouth daily., Disp: , Rfl: 4   meloxicam (MOBIC) 15 MG tablet, Take 1 tablet (15 mg total) by mouth daily., Disp: 30 tablet, Rfl: 3   modafinil (PROVIGIL) 200 MG tablet, Take 1 tablet (200 mg total) by mouth daily., Disp: 30 tablet, Rfl: 5   norethindrone-ethinyl estradiol (LARIN 1/20) 1-20 MG-MCG tablet, Take 1 tablet by mouth daily., Disp: 84 tablet, Rfl: 1   ondansetron (ZOFRAN) 4 MG tablet, Take 1 tablet (4 mg total) by mouth every 8 (eight) hours as needed for nausea or vomiting., Disp: 20 tablet, Rfl: 5   prednisoLONE acetate (PRED FORTE) 1 % ophthalmic suspension, Place 1 drop into the right eye 4 (four) times daily. (Patient taking differently:  Place 1 drop into the right eye 4 (four) times daily as needed.), Disp: 10 mL, Rfl: 0   rizatriptan (MAXALT-MLT) 10 MG disintegrating tablet, Take 1 tablet (10 mg total) by mouth as needed for migraine. May repeat in 2 hours if needed, Disp: 9 tablet, Rfl: 1   Semaglutide,0.25 or 0.5MG /DOS, (OZEMPIC, 0.25 OR 0.5 MG/DOSE,) 2 MG/1.5ML SOPN, Inject 0.5 mg into the skin once a week., Disp: 15 mL, Rfl: 3   tamsulosin (FLOMAX) 0.4 MG CAPS capsule, Take 1 capsule (0.4 mg total) by mouth daily., Disp: 30 capsule, Rfl: 5   tiZANidine (ZANAFLEX) 2 MG tablet, Take 1-2 tablets (2-4 mg total) by mouth at bedtime for spasticity, Disp: 60 tablet, Rfl: 5   triamcinolone ointment (KENALOG) 0.1 %, Apply 1 application topically 2 (two) times daily., Disp: 80 g, Rfl: 0    zonisamide (ZONEGRAN) 100 MG capsule, Take 2 capsules (200 mg total) by mouth at bedtime., Disp: 180 capsule, Rfl: 3  Social History   Tobacco Use  Smoking Status Never  Smokeless Tobacco Never    No Known Allergies Objective:  There were no vitals filed for this visit. There is no height or weight on file to calculate BMI. Constitutional Well developed. Well nourished.  Vascular Dorsalis pedis pulses palpable bilaterally. Posterior tibial pulses palpable bilaterally. Capillary refill normal to all digits.  No cyanosis or clubbing noted. Pedal hair growth normal.  Neurologic Normal speech. Oriented to person, place, and time. Epicritic sensation to light touch grossly present bilaterally.  Dermatologic Raised petechiae/red bumps noted across the dorsum of the left foot.  Pain on palpation to the lesions.  No concern for melanoma noted.  Orthopedic: Normal joint ROM without pain or crepitus bilaterally. No visible deformities. No bony tenderness.    Radiographs: 3 views of skeletally mature adult left foot: No soft tissue mass or lesions noted on the x-ray.  No involvement of bone noted.  No bony abnormalities identified Assessment:   1. Benign skin lesion   2. Soft tissue mass    Plan:  Patient was evaluated and treated and all questions answered.  Left dorsal foot benign skin lesion/soft tissue masses -All questions and concerns were discussed with the patient in extensive detail. -  To me clinically seems likely correlating may be of the venous system.  The lesions are following the venous system/arterial system.  Given that in the hot shower tends to get more raised this likely has to do with vascular involvement.  H -MRI was reviewed with the patient which shows cyst on the dorsal aspect of the foot she does have deeper tissues within the joints however they do not be seem to be correlating with the dorsal soft tissue petechiae's.  Essentially the MRI was negative for  any kind of concerning soft tissue mass or lesions. -At this time patient will benefit from a biopsy of the lesions.  Patient agrees with plan like to proceed with biopsy -Punch biopsy the skin lesion Skin was prepped in standard technique with Betadine.  One-to-one mixture of 1% lidocaine plain half percent Marcaine plain was injected circumferentially in V-block fashion 3 cc.  3 mm punch biopsy was utilized to remove the lesion in its entirety down to the level of the subcutaneous tissue.  The sample was sent to pathology in standard technique.  The wound site was dressed with triple antibiotic 4 x 4 Reed Kerlix and Ace bandage.  I have asked the patient to keep it covered with Neosporin and a  Band-Aid until healing.  I will see her back again in 4 weeks to go over the results.   No follow-ups on file.

## 2021-10-06 ENCOUNTER — Other Ambulatory Visit: Payer: 59

## 2021-10-07 ENCOUNTER — Other Ambulatory Visit (HOSPITAL_COMMUNITY): Payer: Self-pay

## 2021-10-07 ENCOUNTER — Other Ambulatory Visit: Payer: Self-pay | Admitting: Nurse Practitioner

## 2021-10-09 ENCOUNTER — Encounter: Payer: Self-pay | Admitting: Neurology

## 2021-10-09 ENCOUNTER — Other Ambulatory Visit (HOSPITAL_COMMUNITY): Payer: Self-pay

## 2021-10-11 ENCOUNTER — Other Ambulatory Visit: Payer: Self-pay | Admitting: Podiatry

## 2021-10-11 NOTE — Telephone Encounter (Signed)
Per infusion suite/Kim: "She is finally approved.I've already called her to let her know"

## 2021-10-12 DIAGNOSIS — G35 Multiple sclerosis: Secondary | ICD-10-CM | POA: Diagnosis not present

## 2021-10-13 ENCOUNTER — Ambulatory Visit (INDEPENDENT_AMBULATORY_CARE_PROVIDER_SITE_OTHER): Payer: 59 | Admitting: Nurse Practitioner

## 2021-10-13 ENCOUNTER — Encounter: Payer: Self-pay | Admitting: Nurse Practitioner

## 2021-10-13 ENCOUNTER — Other Ambulatory Visit: Payer: 59

## 2021-10-13 ENCOUNTER — Other Ambulatory Visit (HOSPITAL_COMMUNITY): Payer: Self-pay

## 2021-10-13 ENCOUNTER — Other Ambulatory Visit: Payer: Self-pay

## 2021-10-13 VITALS — BP 106/69 | HR 86 | Temp 97.9°F | Ht 69.0 in | Wt 225.8 lb

## 2021-10-13 DIAGNOSIS — G35 Multiple sclerosis: Secondary | ICD-10-CM | POA: Diagnosis not present

## 2021-10-13 DIAGNOSIS — Z6833 Body mass index (BMI) 33.0-33.9, adult: Secondary | ICD-10-CM | POA: Diagnosis not present

## 2021-10-13 DIAGNOSIS — R7301 Impaired fasting glucose: Secondary | ICD-10-CM

## 2021-10-13 DIAGNOSIS — L700 Acne vulgaris: Secondary | ICD-10-CM

## 2021-10-13 MED ORDER — SEMAGLUTIDE (1 MG/DOSE) 4 MG/3ML ~~LOC~~ SOPN
1.0000 mg | PEN_INJECTOR | SUBCUTANEOUS | 1 refills | Status: DC
  Filled 2021-10-13: qty 9, 84d supply, fill #0

## 2021-10-13 MED ORDER — ADAPALENE 0.1 % EX CREA
TOPICAL_CREAM | CUTANEOUS | 1 refills | Status: AC
  Filled 2021-10-13 – 2021-10-16 (×2): qty 45, 30d supply, fill #0

## 2021-10-13 NOTE — Assessment & Plan Note (Signed)
Increased dose ozempic to 1 mg weekly. Discussed lowering calorie intake to 1500 calories per day and incorporating exercise into daily routine to help lose weight.

## 2021-10-13 NOTE — Assessment & Plan Note (Signed)
continue regular visits with neurology as scheduled.

## 2021-10-13 NOTE — Assessment & Plan Note (Signed)
>>  ASSESSMENT AND PLAN FOR BMI 30.0-30.9,ADULT WRITTEN ON 10/13/2021  9:16 AM BY BOSCIA, HEATHER E, NP  Increased dose ozempic to 1 mg weekly. Discussed lowering calorie intake to 1500 calories per day and incorporating exercise into daily routine to help lose weight.

## 2021-10-13 NOTE — Assessment & Plan Note (Signed)
Use adapalene cream daily as needed

## 2021-10-13 NOTE — Progress Notes (Signed)
Established patient visit   Patient: Ashley Reed   DOB: 2001-09-08   21 y.o. Female  MRN: [id number] Visit Date: 10/13/2021   Chief Complaint  Patient presents with   Weight Check   Subjective    HPI  The patient is currently taking Ozempic 0.5mg  weekly. She does have impaired fasting glucose. She states that she has no negative side effects associated with taking ozempic. She has also lost 11 pounds since 07/2021. She states that she is at a plateau with her weight. She is very active and walks a great deal at work. She eats a healthy, low carb, low calorie diet .   Medications: Outpatient Medications Prior to Visit  Medication Sig   albuterol (PROVENTIL HFA;VENTOLIN HFA) 108 (90 Base) MCG/ACT inhaler Inhale 2 puffs 15 minutes prior to exercise and every 4 hours prn cough, wheeze with asthma flare   ALPRAZolam (XANAX) 0.25 MG tablet Take 1 tablet (0.25 mg total) by mouth 2 (two) times daily as needed for anxiety.   B Complex Vitamins (VITAMIN B COMPLEX PO) Take by mouth.   cetirizine (ZYRTEC) 10 MG tablet Take 10 mg by mouth daily.   CRANBERRY CONCENTRATE PO Take by mouth.   Cyanocobalamin (VITAMIN B-12 PO) Take by mouth.   diclofenac sodium (VOLTAREN) 1 % GEL Apply to large joint area up to three times a day as needed   ergocalciferol (VITAMIN D2) 1.25 MG (50000 UT) capsule Take 1 capsule (50,000 Units total) by mouth once a week.   Fluticasone-Salmeterol (ADVAIR) 250-50 MCG/DOSE AEPB INHALE 1 PUFF AS DIRECTED TWICE A DAY   gabapentin (NEURONTIN) 300 MG capsule Take 1 capsule (300 mg total) by mouth 3 (three) times daily.   Galcanezumab-gnlm (EMGALITY) 120 MG/ML SOAJ Inject 120 mg into the skin every 30 (thirty) days.   LARIN FE 1/20 1-20 MG-MCG tablet Take 1 tablet by mouth daily.   meloxicam (MOBIC) 15 MG tablet Take 1 tablet (15 mg total) by mouth daily.   modafinil (PROVIGIL) 200 MG tablet Take 1 tablet (200 mg total) by mouth daily.   norethindrone-ethinyl estradiol (LARIN  1/20) 1-20 MG-MCG tablet Take 1 tablet by mouth daily.   ondansetron (ZOFRAN) 4 MG tablet Take 1 tablet (4 mg total) by mouth every 8 (eight) hours as needed for nausea or vomiting.   prednisoLONE acetate (PRED FORTE) 1 % ophthalmic suspension Place 1 drop into the right eye 4 (four) times daily. (Patient taking differently: Place 1 drop into the right eye 4 (four) times daily as needed.)   rizatriptan (MAXALT-MLT) 10 MG disintegrating tablet Take 1 tablet (10 mg total) by mouth as needed for migraine. May repeat in 2 hours if needed   tiZANidine (ZANAFLEX) 2 MG tablet Take 1-2 tablets (2-4 mg total) by mouth at bedtime for spasticity   zonisamide (ZONEGRAN) 100 MG capsule Take 2 capsules (200 mg total) by mouth at bedtime.   [DISCONTINUED] adapalene (DIFFERIN) 0.1 % cream APPLY PEA SIZED AMOUNT TO FINGER AND SPREAD THIN LAYER TO ACNE AREAS ONCE A DAY IN MORNING   [DISCONTINUED] Semaglutide,0.25 or 0.5MG /DOS, (OZEMPIC, 0.25 OR 0.5 MG/DOSE,) 2 MG/1.5ML SOPN Inject 0.5 mg into the skin once a week.   tamsulosin (FLOMAX) 0.4 MG CAPS capsule Take 1 capsule (0.4 mg total) by mouth daily.   [DISCONTINUED] triamcinolone ointment (KENALOG) 0.1 % Apply 1 application topically 2 (two) times daily.   No facility-administered medications prior to visit.    Review of Systems  Constitutional:  Negative for activity change, appetite  change, chills, fatigue and fever.       Weight loss of 11 pounds since most recent visit.   HENT:  Negative for congestion, postnasal drip, rhinorrhea, sinus pressure, sinus pain, sneezing and sore throat.   Eyes: Negative.   Respiratory:  Negative for cough, chest tightness, shortness of breath and wheezing.   Cardiovascular:  Negative for chest pain and palpitations.  Gastrointestinal:  Negative for abdominal pain, constipation, diarrhea, nausea and vomiting.  Endocrine: Negative for cold intolerance, heat intolerance, polydipsia and polyuria.  Genitourinary:  Negative for  dyspareunia, dysuria, flank pain, frequency and urgency.  Musculoskeletal:  Negative for arthralgias, back pain and myalgias.  Skin:  Negative for rash.       Increased facial acne  Allergic/Immunologic: Negative for environmental allergies.  Neurological:  Negative for dizziness, weakness and headaches.  Hematological:  Negative for adenopathy.  Psychiatric/Behavioral:  The patient is not nervous/anxious.       Objective     Today's Vitals   10/13/21 0849  BP: 106/69  Pulse: 86  Temp: 97.9 F (36.6 C)  SpO2: 98%  Weight: 225 lb 12.8 oz (102.4 kg)  Height: 5\' 9"  (1.753 m)   Body mass index is 33.34 kg/m.   BP Readings from Last 3 Encounters:  10/13/21 106/69  08/22/21 (!) 142/82  07/27/21 109/74    Wt Readings from Last 3 Encounters:  10/13/21 225 lb 12.8 oz (102.4 kg)  08/22/21 231 lb (104.8 kg)  07/27/21 236 lb 14.4 oz (107.5 kg)    Physical Exam Vitals and nursing note reviewed.  Constitutional:      Appearance: Normal appearance. She is well-developed.  HENT:     Head: Normocephalic and atraumatic.  Eyes:     Pupils: Pupils are equal, round, and reactive to light.  Cardiovascular:     Rate and Rhythm: Normal rate and regular rhythm.     Pulses: Normal pulses.     Heart sounds: Normal heart sounds.  Pulmonary:     Effort: Pulmonary effort is normal.     Breath sounds: Normal breath sounds.  Abdominal:     Palpations: Abdomen is soft.  Musculoskeletal:        General: Normal range of motion.     Cervical back: Normal range of motion and neck supple.  Lymphadenopathy:     Cervical: No cervical adenopathy.  Skin:    General: Skin is warm and dry.     Capillary Refill: Capillary refill takes less than 2 seconds.  Neurological:     General: No focal deficit present.     Mental Status: She is alert and oriented to person, place, and time.  Psychiatric:        Mood and Affect: Mood normal.        Behavior: Behavior normal.        Thought Content:  Thought content normal.        Judgment: Judgment normal.      Assessment & Plan     Problem List Items Addressed This Visit       Endocrine   Impaired fasting glucose - Primary    Increase Ozempic to 1mg  weekly. New prescription sent to pharmacy. Continue to limit intake of carbohydrates and sugar. Drink plenty of water       Relevant Medications   Semaglutide, 1 MG/DOSE, 4 MG/3ML SOPN     Nervous and Auditory   Multiple sclerosis ([facility])    continue regular visits with neurology as scheduled.  Musculoskeletal and Integument   Acne vulgaris    Use adapalene cream daily as needed      Relevant Medications   adapalene (DIFFERIN) 0.1 % cream     Other   Body mass index (BMI) of 33.0-33.9 in adult    Increased dose ozempic to 1 mg weekly. Discussed lowering calorie intake to 1500 calories per day and incorporating exercise into daily routine to help lose weight.        Return in about 3 months (around 01/11/2022) for routine - weight management.         Ronnell Freshwater, NP  [facility] [phone] (phone) [phone] (fax)  [place]

## 2021-10-13 NOTE — Assessment & Plan Note (Signed)
Increase Ozempic to 1mg  weekly. New prescription sent to pharmacy. Continue to limit intake of carbohydrates and sugar. Drink plenty of water

## 2021-10-13 NOTE — Assessment & Plan Note (Signed)
>>  ASSESSMENT AND PLAN FOR MULTIPLE SCLEROSIS ([facility]) WRITTEN ON 10/13/2021  9:15 AM BY BOSCIA, HEATHER E, NP  continue regular visits with neurology as scheduled.

## 2021-10-16 ENCOUNTER — Other Ambulatory Visit (HOSPITAL_COMMUNITY): Payer: Self-pay

## 2021-10-17 ENCOUNTER — Other Ambulatory Visit (HOSPITAL_COMMUNITY): Payer: Self-pay

## 2021-10-18 ENCOUNTER — Ambulatory Visit: Payer: 59 | Admitting: Podiatry

## 2021-10-18 ENCOUNTER — Other Ambulatory Visit (HOSPITAL_COMMUNITY): Payer: Self-pay

## 2021-10-18 ENCOUNTER — Other Ambulatory Visit: Payer: Self-pay

## 2021-10-18 DIAGNOSIS — L989 Disorder of the skin and subcutaneous tissue, unspecified: Secondary | ICD-10-CM

## 2021-10-18 NOTE — Progress Notes (Signed)
Subjective:  Patient ID: Ashley Reed, female    DOB: March 31, 2001,  MRN: [id number]  Chief Complaint  Patient presents with   Foot Pain    21 y.o. female presents with the above complaint.  Patient presents with complaint of places on top of her left foot.  She states that she is getting Raised bump that came out of nowhere and has been progressive getting worse.  She states is causing her pain and is hurting her when there is pressure on it.  Is worse after shower.  They get tend to get very red.  She has tried steroid NT bacterial antifungal none of those medication has helped get rid of it.  She states the changes colored from regular rate to more pink-red.  She wanted to get it evaluated she has not seen anyone else prior to seeing me.   Review of Systems: Negative except as noted in the HPI. Denies N/V/F/Ch.  Past Medical History:  Diagnosis Date   Asthma    MS (multiple sclerosis) ([place]) 01/07/2021    Current Outpatient Medications:    adapalene (DIFFERIN) 0.1 % cream, APPLY PEA SIZED AMOUNT TO FINGER AND SPREAD THIN LAYER TO ACNE AREAS ONCE A DAY IN MORNING, Disp: 45 g, Rfl: 1   albuterol (PROVENTIL HFA;VENTOLIN HFA) 108 (90 Base) MCG/ACT inhaler, Inhale 2 puffs 15 minutes prior to exercise and every 4 hours prn cough, wheeze with asthma flare, Disp: , Rfl:    ALPRAZolam (XANAX) 0.25 MG tablet, Take 1 tablet (0.25 mg total) by mouth 2 (two) times daily as needed for anxiety., Disp: 20 tablet, Rfl: 0   B Complex Vitamins (VITAMIN B COMPLEX PO), Take by mouth., Disp: , Rfl:    cetirizine (ZYRTEC) 10 MG tablet, Take 10 mg by mouth daily., Disp: , Rfl:    CRANBERRY CONCENTRATE PO, Take by mouth., Disp: , Rfl:    Cyanocobalamin (VITAMIN B-12 PO), Take by mouth., Disp: , Rfl:    diclofenac sodium (VOLTAREN) 1 % GEL, Apply to large joint area up to three times a day as needed, Disp: 2 Tube, Rfl: 1   ergocalciferol (VITAMIN D2) 1.25 MG (50000 UT) capsule, Take 1 capsule (50,000 Units  total) by mouth once a week., Disp: 12 capsule, Rfl: 1   Fluticasone-Salmeterol (ADVAIR) 250-50 MCG/DOSE AEPB, INHALE 1 PUFF AS DIRECTED TWICE A DAY, Disp: 60 each, Rfl: 2   gabapentin (NEURONTIN) 300 MG capsule, Take 1 capsule (300 mg total) by mouth 3 (three) times daily., Disp: 90 capsule, Rfl: 11   Galcanezumab-gnlm (EMGALITY) 120 MG/ML SOAJ, Inject 120 mg into the skin every 30 (thirty) days., Disp: 1 mL, Rfl: 11   LARIN FE 1/20 1-20 MG-MCG tablet, Take 1 tablet by mouth daily., Disp: , Rfl: 4   meloxicam (MOBIC) 15 MG tablet, Take 1 tablet (15 mg total) by mouth daily., Disp: 30 tablet, Rfl: 3   modafinil (PROVIGIL) 200 MG tablet, Take 1 tablet (200 mg total) by mouth daily., Disp: 30 tablet, Rfl: 5   norethindrone-ethinyl estradiol (LARIN 1/20) 1-20 MG-MCG tablet, Take 1 tablet by mouth daily., Disp: 84 tablet, Rfl: 1   ondansetron (ZOFRAN) 4 MG tablet, Take 1 tablet (4 mg total) by mouth every 8 (eight) hours as needed for nausea or vomiting., Disp: 20 tablet, Rfl: 5   prednisoLONE acetate (PRED FORTE) 1 % ophthalmic suspension, Place 1 drop into the right eye 4 (four) times daily. (Patient taking differently: Place 1 drop into the right eye 4 (four) times daily  as needed.), Disp: 10 mL, Rfl: 0   rizatriptan (MAXALT-MLT) 10 MG disintegrating tablet, Take 1 tablet (10 mg total) by mouth as needed for migraine. May repeat in 2 hours if needed, Disp: 9 tablet, Rfl: 1   Semaglutide, 1 MG/DOSE, 4 MG/3ML SOPN, Inject 1 mg as directed once a week., Disp: 9 mL, Rfl: 1   tiZANidine (ZANAFLEX) 2 MG tablet, Take 1-2 tablets (2-4 mg total) by mouth at bedtime for spasticity, Disp: 60 tablet, Rfl: 5   zonisamide (ZONEGRAN) 100 MG capsule, Take 2 capsules (200 mg total) by mouth at bedtime., Disp: 180 capsule, Rfl: 3  Social History   Tobacco Use  Smoking Status Never  Smokeless Tobacco Never    No Known Allergies Objective:  There were no vitals filed for this visit. There is no height or weight  on file to calculate BMI. Constitutional Well developed. Well nourished.  Vascular Dorsalis pedis pulses palpable bilaterally. Posterior tibial pulses palpable bilaterally. Capillary refill normal to all digits.  No cyanosis or clubbing noted. Pedal hair growth normal.  Neurologic Normal speech. Oriented to person, place, and time. Epicritic sensation to light touch grossly present bilaterally.  Dermatologic Raised petechiae/red bumps noted across the dorsum of the left foot.  Pain on palpation to the lesions.  No concern for melanoma noted.  Orthopedic: Normal joint ROM without pain or crepitus bilaterally. No visible deformities. No bony tenderness.    Radiographs: 3 views of skeletally mature adult left foot: No soft tissue mass or lesions noted on the x-ray.  No involvement of bone noted.  No bony abnormalities identified Assessment:   1. Benign skin lesion    Plan:  Patient was evaluated and treated and all questions answered.  Left dorsal foot benign skin lesion/soft tissue masses -All questions and concerns were discussed with the patient in extensive detail. -  To me clinically seems likely correlating may be of the venous system.  The lesions are following the venous system/arterial system.  Given that in the hot shower tends to get more raised this likely has to do with vascular involvement.  H -MRI was reviewed with the patient which shows cyst on the dorsal aspect of the foot she does have deeper tissues within the joints however they do not be seem to be correlating with the dorsal soft tissue petechiae's.  Essentially the MRI was negative for any kind of concerning soft tissue mass or lesions. 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 -I discussed with her she could benefit from dermatology referral to discuss if there is any other treatment  options.  She states understanding.   No follow-ups on file.

## 2021-10-20 ENCOUNTER — Ambulatory Visit: Payer: 59 | Admitting: Podiatry

## 2021-10-23 ENCOUNTER — Other Ambulatory Visit (HOSPITAL_COMMUNITY): Payer: Self-pay

## 2021-10-23 ENCOUNTER — Other Ambulatory Visit: Payer: Self-pay | Admitting: Neurology

## 2021-10-23 MED ORDER — RIZATRIPTAN BENZOATE 10 MG PO TBDP
10.0000 mg | ORAL_TABLET | ORAL | 1 refills | Status: DC | PRN
  Filled 2021-10-23: qty 9, 30d supply, fill #0
  Filled 2021-11-19: qty 9, 30d supply, fill #1

## 2021-10-24 ENCOUNTER — Telehealth: Payer: Self-pay | Admitting: *Deleted

## 2021-10-24 NOTE — Telephone Encounter (Signed)
Faxed completed/signed [employer] form for cooling program to [phone]. Received fax confirmation.

## 2021-10-25 ENCOUNTER — Telehealth: Payer: Self-pay | Admitting: Neurology

## 2021-10-25 ENCOUNTER — Ambulatory Visit: Payer: 59 | Admitting: Nurse Practitioner

## 2021-10-25 NOTE — Telephone Encounter (Signed)
Noted, [facility] aware of approval, thank you

## 2021-10-25 NOTE — Telephone Encounter (Signed)
[facility]/ [employer] has current approval on file 09/15/21-09/14/22 Ocrevus 600mg  IV every 6 months.

## 2021-10-30 ENCOUNTER — Other Ambulatory Visit (HOSPITAL_COMMUNITY): Payer: Self-pay

## 2021-10-31 ENCOUNTER — Other Ambulatory Visit (HOSPITAL_COMMUNITY): Payer: Self-pay

## 2021-11-01 ENCOUNTER — Other Ambulatory Visit (HOSPITAL_COMMUNITY): Payer: Self-pay

## 2021-11-03 ENCOUNTER — Other Ambulatory Visit (HOSPITAL_COMMUNITY): Payer: Self-pay

## 2021-11-03 ENCOUNTER — Ambulatory Visit: Payer: 59 | Admitting: Podiatry

## 2021-11-03 DIAGNOSIS — Z113 Encounter for screening for infections with a predominantly sexual mode of transmission: Secondary | ICD-10-CM | POA: Diagnosis not present

## 2021-11-03 DIAGNOSIS — Z6834 Body mass index (BMI) 34.0-34.9, adult: Secondary | ICD-10-CM | POA: Diagnosis not present

## 2021-11-03 DIAGNOSIS — Z01419 Encounter for gynecological examination (general) (routine) without abnormal findings: Secondary | ICD-10-CM | POA: Diagnosis not present

## 2021-11-03 MED ORDER — NORETHIN ACE-ETH ESTRAD-FE 1-20 MG-MCG PO TABS
1.0000 | ORAL_TABLET | Freq: Every day | ORAL | 5 refills | Status: DC
  Filled 2021-11-03: qty 28, 28d supply, fill #0

## 2021-11-13 ENCOUNTER — Other Ambulatory Visit (HOSPITAL_COMMUNITY): Payer: Self-pay

## 2021-11-16 ENCOUNTER — Encounter: Payer: Self-pay | Admitting: Neurology

## 2021-11-16 DIAGNOSIS — G35 Multiple sclerosis: Secondary | ICD-10-CM

## 2021-11-16 DIAGNOSIS — Z79899 Other long term (current) drug therapy: Secondary | ICD-10-CM

## 2021-11-16 NOTE — Telephone Encounter (Signed)
Noted,  [facility] no auth req order faxed to [facility] [employer], they will reach out to the patient to schedule  ?

## 2021-11-19 ENCOUNTER — Other Ambulatory Visit: Payer: Self-pay | Admitting: Nurse Practitioner

## 2021-11-19 DIAGNOSIS — E559 Vitamin D deficiency, unspecified: Secondary | ICD-10-CM

## 2021-11-20 ENCOUNTER — Other Ambulatory Visit (HOSPITAL_COMMUNITY): Payer: Self-pay

## 2021-11-20 MED ORDER — ERGOCALCIFEROL 1.25 MG (50000 UT) PO CAPS
1.0000 | ORAL_CAPSULE | ORAL | 1 refills | Status: DC
  Filled 2021-11-20: qty 12, 84d supply, fill #0
  Filled 2022-03-16: qty 12, 84d supply, fill #1

## 2021-11-20 NOTE — Telephone Encounter (Signed)
schedule for 4/7 1230pm per patient request  ? ?

## 2021-11-20 NOTE — Telephone Encounter (Signed)
rescheduled 4/6  1230pm ? ?

## 2021-11-22 ENCOUNTER — Other Ambulatory Visit (HOSPITAL_COMMUNITY): Payer: Self-pay

## 2021-11-22 ENCOUNTER — Encounter: Payer: Self-pay | Admitting: Nurse Practitioner

## 2021-11-22 ENCOUNTER — Other Ambulatory Visit: Payer: Self-pay | Admitting: Nurse Practitioner

## 2021-11-22 DIAGNOSIS — Z6833 Body mass index (BMI) 33.0-33.9, adult: Secondary | ICD-10-CM

## 2021-11-22 DIAGNOSIS — R634 Abnormal weight loss: Secondary | ICD-10-CM

## 2021-11-22 MED ORDER — WEGOVY 1 MG/0.5ML ~~LOC~~ SOAJ
1.0000 mg | SUBCUTANEOUS | 1 refills | Status: DC
  Filled 2021-11-22 – 2021-12-26 (×6): qty 2, 28d supply, fill #0
  Filled 2022-02-05: qty 2, 28d supply, fill #1

## 2021-11-22 NOTE — Progress Notes (Signed)
Patient's insurance no longer covering ozempc unless positive for diabetes. Change to wegovy '1mg'$  [place] weekly. New prescription sent to her pharmacy. Will reassess at next, in office visit and titrate as indicated . ?

## 2021-11-23 ENCOUNTER — Other Ambulatory Visit (HOSPITAL_COMMUNITY): Payer: Self-pay

## 2021-11-23 ENCOUNTER — Other Ambulatory Visit: Payer: Self-pay | Admitting: Neurology

## 2021-11-23 MED ORDER — ALPRAZOLAM 0.5 MG PO TABS
0.5000 mg | ORAL_TABLET | Freq: Once | ORAL | 0 refills | Status: DC | PRN
  Filled 2021-11-23: qty 2, 1d supply, fill #0

## 2021-11-23 NOTE — Telephone Encounter (Signed)
Pt is in need of something to help with upcoming MRI ?

## 2021-11-27 ENCOUNTER — Other Ambulatory Visit (HOSPITAL_COMMUNITY): Payer: Self-pay

## 2021-11-28 ENCOUNTER — Other Ambulatory Visit (HOSPITAL_COMMUNITY): Payer: Self-pay

## 2021-11-30 ENCOUNTER — Other Ambulatory Visit (HOSPITAL_COMMUNITY): Payer: Self-pay

## 2021-12-04 ENCOUNTER — Encounter: Payer: Self-pay | Admitting: Nurse Practitioner

## 2021-12-05 ENCOUNTER — Other Ambulatory Visit (HOSPITAL_COMMUNITY): Payer: Self-pay

## 2021-12-05 DIAGNOSIS — Z309 Encounter for contraceptive management, unspecified: Secondary | ICD-10-CM | POA: Diagnosis not present

## 2021-12-05 MED ORDER — ETONOGESTREL-ETHINYL ESTRADIOL 0.12-0.015 MG/24HR VA RING
VAGINAL_RING | VAGINAL | 11 refills | Status: DC
  Filled 2021-12-05: qty 3, 84d supply, fill #0
  Filled 2022-02-05: qty 3, 84d supply, fill #1
  Filled 2022-04-30: qty 3, 84d supply, fill #2

## 2021-12-11 ENCOUNTER — Other Ambulatory Visit (HOSPITAL_COMMUNITY): Payer: Self-pay

## 2021-12-18 ENCOUNTER — Other Ambulatory Visit (HOSPITAL_COMMUNITY): Payer: Self-pay

## 2021-12-18 ENCOUNTER — Encounter: Payer: Self-pay | Admitting: Neurology

## 2021-12-19 ENCOUNTER — Other Ambulatory Visit (HOSPITAL_COMMUNITY): Payer: Self-pay

## 2021-12-19 ENCOUNTER — Encounter: Payer: Self-pay | Admitting: Nurse Practitioner

## 2021-12-19 ENCOUNTER — Other Ambulatory Visit: Payer: Self-pay | Admitting: Nurse Practitioner

## 2021-12-19 DIAGNOSIS — R110 Nausea: Secondary | ICD-10-CM

## 2021-12-19 MED ORDER — ONDANSETRON 4 MG PO TBDP
4.0000 mg | ORAL_TABLET | Freq: Three times a day (TID) | ORAL | 1 refills | Status: DC | PRN
  Filled 2021-12-19: qty 30, 5d supply, fill #0
  Filled 2022-01-19: qty 30, 5d supply, fill #1

## 2021-12-19 NOTE — Progress Notes (Signed)
Due to nausea, sent zoffran '4mg'$  orally disintegrating tablets. May take 1 to 2 tablets every 8 hours as needed for nausea.  ?

## 2021-12-20 ENCOUNTER — Other Ambulatory Visit: Payer: Self-pay | Admitting: *Deleted

## 2021-12-20 ENCOUNTER — Other Ambulatory Visit (HOSPITAL_COMMUNITY): Payer: Self-pay

## 2021-12-20 ENCOUNTER — Other Ambulatory Visit: Payer: Self-pay | Admitting: Neurology

## 2021-12-20 MED ORDER — RIZATRIPTAN BENZOATE 10 MG PO TBDP
10.0000 mg | ORAL_TABLET | ORAL | 7 refills | Status: DC | PRN
  Filled 2021-12-20: qty 9, 30d supply, fill #0
  Filled 2022-01-19: qty 9, 30d supply, fill #1
  Filled 2022-02-19: qty 9, 30d supply, fill #2
  Filled 2022-03-21: qty 9, 30d supply, fill #3
  Filled 2022-04-23: qty 9, 30d supply, fill #4
  Filled 2022-05-23: qty 9, 30d supply, fill #5
  Filled 2022-07-04: qty 9, 30d supply, fill #6
  Filled 2022-09-04: qty 9, 30d supply, fill #7

## 2021-12-21 DIAGNOSIS — G35 Multiple sclerosis: Secondary | ICD-10-CM | POA: Diagnosis not present

## 2021-12-21 DIAGNOSIS — L209 Atopic dermatitis, unspecified: Secondary | ICD-10-CM | POA: Diagnosis not present

## 2021-12-21 DIAGNOSIS — L920 Granuloma annulare: Secondary | ICD-10-CM | POA: Diagnosis not present

## 2021-12-25 ENCOUNTER — Encounter: Payer: Self-pay | Admitting: Nurse Practitioner

## 2021-12-25 ENCOUNTER — Telehealth (INDEPENDENT_AMBULATORY_CARE_PROVIDER_SITE_OTHER): Payer: 59 | Admitting: Nurse Practitioner

## 2021-12-25 ENCOUNTER — Other Ambulatory Visit (HOSPITAL_COMMUNITY): Payer: Self-pay

## 2021-12-25 VITALS — Ht 68.5 in | Wt 225.0 lb

## 2021-12-25 DIAGNOSIS — K529 Noninfective gastroenteritis and colitis, unspecified: Secondary | ICD-10-CM

## 2021-12-25 DIAGNOSIS — R1032 Left lower quadrant pain: Secondary | ICD-10-CM | POA: Diagnosis not present

## 2021-12-25 MED ORDER — DICYCLOMINE HCL 10 MG PO CAPS
10.0000 mg | ORAL_CAPSULE | Freq: Three times a day (TID) | ORAL | 1 refills | Status: DC
  Filled 2021-12-25: qty 90, 12d supply, fill #0

## 2021-12-25 NOTE — Progress Notes (Signed)
Virtual Visit via Telephone Note ? ?I connected with Weippe on 12/26/21 at  4:10 PM EDT by telephone and verified that I am speaking with the correct person using two identifiers. ? ?Location: ?Patient: home ?Provider: [facility]    ?  ?I discussed the limitations, risks, security and privacy concerns of performing an evaluation and management service by telephone and the availability of in person appointments. I also discussed with the patient that there may be a patient responsible charge related to this service. The patient expressed understanding and agreed to proceed. ? ? ?History of Present Illness: ?The patient states that she has been having abdominal pain off and on since January. Initially thought it was related to the infusions she gets for MS. Was told that infusion is not likely to be the cause. She states that with time, pain is gradually becoming more often and more intense. Pain is mostly in the left lower quadrant of the abdomen. Sometimes, present in lower right quadrant. She has nausea, intestinal cramping, and diarrhea associated with these episodes. She states that most of the episodes are associated with eating. More so if she is out to eat. She states that sometimes, she is unable to leave a restaurant or even finish her meal before she has to have bowel movement. Bowel movements are mostly loose. She states that nausea has been so severe that she has to take prescribed zofran several times. She describes the pain as a twisting sensation in the intestines. Generally results in immediate loose bowel movement.  ?She states that she has been keeping food diary since symptoms have been worsening. Mostly these symptoms are occurring when she eats out. They have happened while eating pizza, [place] chicken and rice, [employer] hashbrown casserole,  also occur after eating bagels and salad. If there is a day when she is having these episodes, she will try to  eat more bland foods. Even this causes increase in symptoms.  ?The patient is taking Wegovy injections to help with weight loss. She states that she has not noted any correlation between taking, or not taking this medication, and the presence of these symptoms. This medication does not seem to be worsening or contributing to the symptoms.  ?  ?Observations/Objective: ? ?The patient is alert and oriented. She is pleasant and answers all questions appropriately. Breathing is non-labored. She is in no acute distress at this time.   ? ?Today's Vitals  ? 12/25/21 1557  ?Weight: 225 lb (102.1 kg)  ?Height: 5' 8.5" (1.74 m)  ? ?Body mass index is 33.71 kg/m?.  ? ?Wt Readings from Last 3 Encounters:  ?12/25/21 225 lb (102.1 kg)  ?10/13/21 225 lb 12.8 oz (102.4 kg)  ?08/22/21 231 lb (104.8 kg)  ?  ? ?Assessment and Plan: ?1. Noninfectious gastroenteritis, unspecified type ?Differential includes gastroenteritis/colitis. May also include gallbladder problems. May also consider food and/or gluten sensitivities. Will get CT abdomen and pelvis for further evaluation.  ?- CT Abdomen Pelvis W Contrast; Future ? ?2. Left lower quadrant abdominal pain ?Trial dicyclomine '10mg'$  - may take 1 to 2 capsules up to three times daily as needed for cramping/pain. CT of abdomen and pelvis ordered for further evaluation.  ?- CT Abdomen Pelvis W Contrast; Future ?- dicyclomine (BENTYL) 10 MG capsule; Take 1-2 capsules (10-20 mg total) by mouth 4 (four) times daily -  before meals and at bedtime.  Dispense: 90 capsule; Refill: 1 ? ?3. LLQ cramping ?Trial  dicyclomine '10mg'$  - may take 1 to 2 capsules up to three times daily as needed for cramping/pain. Encouraged her to try taking 30 minutes to meals, especially when eating out to prevent flare of symptoms. Consider blood work for auto immune causes and food sensitivities if negative CT  ?- dicyclomine (BENTYL) 10 MG capsule; Take 1-2 capsules (10-20 mg total) by mouth 4 (four) times daily -  before  meals and at bedtime.  Dispense: 90 capsule; Refill: 1  ? ?Follow Up Instructions: ? ?  ?I discussed the assessment and treatment plan with the patient. The patient was provided an opportunity to ask questions and all were answered. The patient agreed with the plan and demonstrated an understanding of the instructions. ?  ?The patient was advised to call back or seek an in-person evaluation if the symptoms worsen or if the condition fails to improve as anticipated. ? ?I provided 20 minutes of non-face-to-face time during this encounter. ? ? ?Ronnell Freshwater, NP  ?

## 2021-12-25 NOTE — Patient Instructions (Signed)

## 2021-12-26 ENCOUNTER — Other Ambulatory Visit (HOSPITAL_COMMUNITY): Payer: Self-pay

## 2021-12-26 DIAGNOSIS — R1032 Left lower quadrant pain: Secondary | ICD-10-CM | POA: Insufficient documentation

## 2021-12-26 DIAGNOSIS — K529 Noninfective gastroenteritis and colitis, unspecified: Secondary | ICD-10-CM | POA: Insufficient documentation

## 2021-12-28 ENCOUNTER — Ambulatory Visit
Admission: RE | Admit: 2021-12-28 | Discharge: 2021-12-28 | Disposition: A | Discharge status: Home / Self Care | Payer: 59 | Source: Ambulatory Visit | Attending: Nurse Practitioner | Admitting: Nurse Practitioner

## 2021-12-28 DIAGNOSIS — K529 Noninfective gastroenteritis and colitis, unspecified: Secondary | ICD-10-CM | POA: Diagnosis not present

## 2021-12-28 DIAGNOSIS — R1032 Left lower quadrant pain: Secondary | ICD-10-CM

## 2021-12-28 MED ORDER — IOPAMIDOL (ISOVUE-300) INJECTION 61%
100.0000 mL | Freq: Once | INTRAVENOUS | Status: AC | PRN
  Administered 2021-12-28: 100 mL via INTRAVENOUS

## 2021-12-31 NOTE — Progress Notes (Signed)
Please let the patient know that her  CT scan showed pretty significant constipation. This could be creating many of her symptoms. The gallbladder, appendix, and other abdominal organs looked unremarkable. I recommend she start with daily dose of miralax which I 17 grams. If this does not help ease the belly discomfort after several days, will try something like Linzes.  ?Thanks so much.   -HB

## 2022-01-01 ENCOUNTER — Encounter: Payer: Self-pay | Admitting: Neurology

## 2022-01-08 ENCOUNTER — Encounter: Payer: Self-pay | Admitting: Nurse Practitioner

## 2022-01-08 ENCOUNTER — Other Ambulatory Visit (HOSPITAL_COMMUNITY): Payer: Self-pay

## 2022-01-08 ENCOUNTER — Other Ambulatory Visit: Payer: Self-pay | Admitting: Nurse Practitioner

## 2022-01-08 ENCOUNTER — Other Ambulatory Visit: Payer: 59

## 2022-01-08 DIAGNOSIS — K5904 Chronic idiopathic constipation: Secondary | ICD-10-CM

## 2022-01-08 DIAGNOSIS — R1032 Left lower quadrant pain: Secondary | ICD-10-CM

## 2022-01-08 MED ORDER — LINACLOTIDE 145 MCG PO CAPS
145.0000 ug | ORAL_CAPSULE | Freq: Every day | ORAL | 1 refills | Status: DC
  Filled 2022-01-08: qty 30, 30d supply, fill #0
  Filled 2022-02-05: qty 30, 30d supply, fill #1

## 2022-01-08 NOTE — Progress Notes (Signed)
Added linzess 158m daily to hep with cramping and constipation. Sent to [facility]  ?

## 2022-01-11 ENCOUNTER — Ambulatory Visit (INDEPENDENT_AMBULATORY_CARE_PROVIDER_SITE_OTHER): Payer: 59 | Admitting: Nurse Practitioner

## 2022-01-11 ENCOUNTER — Encounter: Payer: Self-pay | Admitting: Nurse Practitioner

## 2022-01-11 VITALS — BP 116/79 | HR 89 | Temp 98.2°F | Ht 69.0 in | Wt 220.7 lb

## 2022-01-11 DIAGNOSIS — Z6833 Body mass index (BMI) 33.0-33.9, adult: Secondary | ICD-10-CM

## 2022-01-11 DIAGNOSIS — R1032 Left lower quadrant pain: Secondary | ICD-10-CM

## 2022-01-11 DIAGNOSIS — G35 Multiple sclerosis: Secondary | ICD-10-CM

## 2022-01-11 DIAGNOSIS — K5904 Chronic idiopathic constipation: Secondary | ICD-10-CM | POA: Diagnosis not present

## 2022-01-11 NOTE — Progress Notes (Signed)
Established patient visit ? ? ?Patient: Ashley Reed   DOB: 02/14/2001   21 y.o. Female  MRN: [id number] ?Visit Date: 01/11/2022 ? ? ?Chief Complaint  ?Patient presents with  ? Weight Check  ? ?Subjective  ?  ?HPI  ?Follow up visit.  ?-started on saxenda and was changed to [employer] for insurance preference. Currently on '1mg'$  dose.  ?-did have visit due to severe abdominal pain and cramping along with diarrhea. Had CT abdomen and pelvis. Showed Moderate stool burden throughout the colon. Tried daily dose miralax. When this did not work, sent prescription for Linzess 141mg daily.  ?-consider referral to GI ?-she states that symptoms have improved little since her most recent visit.  ? ? ?Medications: ?Outpatient Medications Prior to Visit  ?Medication Sig  ? adapalene (DIFFERIN) 0.1 % cream APPLY PEA SIZED AMOUNT TO FINGER AND SPREAD THIN LAYER TO ACNE AREAS ONCE A DAY IN MORNING  ? albuterol (PROVENTIL HFA;VENTOLIN HFA) 108 (90 Base) MCG/ACT inhaler Inhale 2 puffs 15 minutes prior to exercise and every 4 hours prn cough, wheeze with asthma flare  ? B Complex Vitamins (VITAMIN B COMPLEX PO) Take by mouth.  ? cetirizine (ZYRTEC) 10 MG tablet Take 10 mg by mouth daily.  ? Cyanocobalamin (VITAMIN B-12 PO) Take by mouth.  ? diclofenac sodium (VOLTAREN) 1 % GEL Apply to large joint area up to three times a day as needed  ? dicyclomine (BENTYL) 10 MG capsule Take 1-2 capsules (10-20 mg total) by mouth 4 (four) times daily -  before meals and at bedtime.  ? ergocalciferol (VITAMIN D2) 1.25 MG (50000 UT) capsule Take 1 capsule (50,000 Units total) by mouth once a week.  ? etonogestrel-ethinyl estradiol (NUVARING) 0.12-0.015 MG/24HR vaginal ring Insert 1 vaginal ring every month by vaginal route.  ? gabapentin (NEURONTIN) 300 MG capsule Take 1 capsule (300 mg total) by mouth 3 (three) times daily.  ? Galcanezumab-gnlm (EMGALITY) 120 MG/ML SOAJ Inject 120 mg into the skin every 30 (thirty) days.  ? linaclotide (LINZESS) 145 MCG  CAPS capsule Take 1 capsule (145 mcg total) by mouth daily before breakfast.  ? meloxicam (MOBIC) 15 MG tablet Take 1 tablet (15 mg total) by mouth daily.  ? ondansetron (ZOFRAN-ODT) 4 MG disintegrating tablet Dissolve 1-2 tablets (4-8 mg total) by mouth every 8 (eight) hours as needed for nausea or vomiting.  ? prednisoLONE acetate (PRED FORTE) 1 % ophthalmic suspension Place 1 drop into the right eye 4 (four) times daily. (Patient taking differently: Place 1 drop into the right eye 4 (four) times daily as needed.)  ? rizatriptan (MAXALT-MLT) 10 MG disintegrating tablet Dissolve 1 tablet (10 mg total) by mouth as needed for migraine. May repeat in 2 hours if needed  ? Semaglutide-Weight Management (WEGOVY) 1 MG/0.5ML SOAJ Inject 1 mg into the skin once a week.  ? tiZANidine (ZANAFLEX) 2 MG tablet Take 1-2 tablets (2-4 mg total) by mouth at bedtime for spasticity  ? Fluticasone-Salmeterol (ADVAIR) 250-50 MCG/DOSE AEPB INHALE 1 PUFF AS DIRECTED TWICE A DAY  ? [DISCONTINUED] CRANBERRY CONCENTRATE PO Take by mouth. (Patient not taking: Reported on 12/25/2021)  ? ?No facility-administered medications prior to visit.  ? ? ?Review of Systems  ?Constitutional:  Positive for appetite change and fatigue. Negative for activity change, chills and fever.  ?HENT:  Negative for congestion, postnasal drip, rhinorrhea, sinus pressure, sinus pain, sneezing and sore throat.   ?Eyes: Negative.   ?Respiratory:  Negative for cough, chest tightness, shortness of breath and wheezing.   ?  Cardiovascular:  Negative for chest pain and palpitations.  ?Gastrointestinal:  Positive for abdominal pain, diarrhea, nausea and vomiting. Negative for constipation.  ?     Intestinal cramping.   ?Endocrine: Negative for cold intolerance, heat intolerance, polydipsia and polyuria.  ?Genitourinary:  Negative for dyspareunia, dysuria, flank pain, frequency and urgency.  ?Musculoskeletal:  Negative for arthralgias, back pain and myalgias.  ?Skin:  Negative  for rash.  ?Allergic/Immunologic: Negative for environmental allergies.  ?Neurological:  Negative for dizziness, weakness and headaches.  ?Hematological:  Negative for adenopathy.  ?Psychiatric/Behavioral:  The patient is nervous/anxious.   ? ? ? ? Objective  ?  ?BP 116/79   Pulse 89   Temp 98.2 ?F (36.8 ?C)   Wt 220 lb 11.2 oz (100.1 kg)   LMP 01/08/2022   SpO2 99%   BMI 33.07 kg/m?  ?BP Readings from Last 3 Encounters:  ?01/11/22 116/79  ?10/13/21 106/69  ?08/22/21 (!) 142/82  ?  ?Wt Readings from Last 3 Encounters:  ?01/11/22 220 lb 11.2 oz (100.1 kg)  ?12/25/21 225 lb (102.1 kg)  ?10/13/21 225 lb 12.8 oz (102.4 kg)  ?  ?Physical Exam ?Vitals and nursing note reviewed.  ?Constitutional:   ?   Appearance: Normal appearance. She is well-developed. She is ill-appearing.  ?HENT:  ?   Head: Normocephalic and atraumatic.  ?Eyes:  ?   Pupils: Pupils are equal, round, and reactive to light.  ?Cardiovascular:  ?   Rate and Rhythm: Normal rate and regular rhythm.  ?   Pulses: Normal pulses.  ?   Heart sounds: Normal heart sounds.  ?Pulmonary:  ?   Effort: Pulmonary effort is normal.  ?   Breath sounds: Normal breath sounds.  ?Abdominal:  ?   General: Bowel sounds are normal. There is no distension.  ?   Palpations: Abdomen is soft. There is no mass.  ?   Tenderness: There is abdominal tenderness. There is no right CVA tenderness, left CVA tenderness, guarding or rebound.  ?   Hernia: No hernia is present.  ?   Comments: Mild, generalized abdominal tenderness with palpation.   ?Musculoskeletal:     ?   General: Normal range of motion.  ?   Cervical back: Normal range of motion and neck supple.  ?Lymphadenopathy:  ?   Cervical: No cervical adenopathy.  ?Skin: ?   General: Skin is warm and dry.  ?   Capillary Refill: Capillary refill takes less than 2 seconds.  ?Neurological:  ?   General: No focal deficit present.  ?   Mental Status: She is alert and oriented to person, place, and time.  ?Psychiatric:     ?   Mood and  Affect: Mood normal.     ?   Behavior: Behavior normal.     ?   Thought Content: Thought content normal.     ?   Judgment: Judgment normal.  ?  ? ? Assessment & Plan  ?  ?1. LLQ cramping ?Reviewed results of recent CT scan which showed moderate stool burden without other acute abnormalities.  Treatment with daily MiraLAX has been ineffective.  Has trialed 1 dose Linzess which has not been effective yet.  Refer to GI for further evaluation. ?- Ambulatory referral to Gastroenterology ? ?2. Chronic idiopathic constipation ?Reviewed results of recent CT scan which showed moderate stool burden without other acute abnormalities.  Treatment with daily MiraLAX has been ineffective.  Has trialed 1 dose Linzess which has not been effective yet.  Refer  to GI for further evaluation. ?- Ambulatory referral to Gastroenterology ? ?3. Multiple sclerosis ([place]) ?Previous diagnosis of multiple sclerosis may be contributing to idiopathic constipation.  She should continue with regular visits to neurology.  Refer to GI for further evaluation of constipation. ?- Ambulatory referral to Gastroenterology ? ?4. Body mass index (BMI) of 33.0-33.9 in adult ?Hold Wegovy injections for weight loss for now.  May be contributing to abdominal pain constipation. Discussed lowering calorie intake to 1500 calories per day and incorporating exercise into daily routine to help lose weight.  ? ?Problem List Items Addressed This Visit   ? ?  ? Digestive  ? Chronic idiopathic constipation  ? Relevant Orders  ? Ambulatory referral to Gastroenterology  ?  ? Nervous and Auditory  ? Multiple sclerosis ([place])  ? Relevant Orders  ? Ambulatory referral to Gastroenterology  ?  ? Other  ? Body mass index (BMI) of 33.0-33.9 in adult  ? LLQ cramping - Primary  ? Relevant Orders  ? Ambulatory referral to Gastroenterology  ?  ? ?Return in about 6 weeks (around 02/22/2022) for weight management/constipation .  ?   ? ? ? ? ?Ronnell Freshwater, NP  ?[facility]  [facility] ?[phone] (phone) ?[phone] (fax) ? ?[facility]  ?

## 2022-01-17 ENCOUNTER — Other Ambulatory Visit (HOSPITAL_COMMUNITY): Payer: Self-pay

## 2022-01-17 ENCOUNTER — Telehealth: Payer: 59 | Admitting: Physician Assistant

## 2022-01-17 DIAGNOSIS — B9689 Other specified bacterial agents as the cause of diseases classified elsewhere: Secondary | ICD-10-CM | POA: Diagnosis not present

## 2022-01-17 DIAGNOSIS — J208 Acute bronchitis due to other specified organisms: Secondary | ICD-10-CM | POA: Diagnosis not present

## 2022-01-17 MED ORDER — AZITHROMYCIN 250 MG PO TABS
ORAL_TABLET | ORAL | 0 refills | Status: AC
  Filled 2022-01-17: qty 6, 5d supply, fill #0

## 2022-01-17 NOTE — Patient Instructions (Signed)
?Ashley Reed, thank you for joining Mar Daring, PA-C for today's virtual visit.  While this provider is not your primary care provider (PCP), if your PCP is located in our provider database this encounter information will be shared with them immediately following your visit. ? ?Consent: ?(Patient) Ashley Reed provided verbal consent for this virtual visit at the beginning of the encounter. ? ?Current Medications: ? ?Current Outpatient Medications:  ?  azithromycin (ZITHROMAX) 250 MG tablet, Take 2 tablets on day 1, then 1 tablet daily on days 2 through 5, Disp: 6 tablet, Rfl: 0 ?  adapalene (DIFFERIN) 0.1 % cream, APPLY PEA SIZED AMOUNT TO FINGER AND SPREAD THIN LAYER TO ACNE AREAS ONCE A DAY IN MORNING, Disp: 45 g, Rfl: 1 ?  albuterol (PROVENTIL HFA;VENTOLIN HFA) 108 (90 Base) MCG/ACT inhaler, Inhale 2 puffs 15 minutes prior to exercise and every 4 hours prn cough, wheeze with asthma flare, Disp: , Rfl:  ?  B Complex Vitamins (VITAMIN B COMPLEX PO), Take by mouth., Disp: , Rfl:  ?  cetirizine (ZYRTEC) 10 MG tablet, Take 10 mg by mouth daily., Disp: , Rfl:  ?  Cyanocobalamin (VITAMIN B-12 PO), Take by mouth., Disp: , Rfl:  ?  diclofenac sodium (VOLTAREN) 1 % GEL, Apply to large joint area up to three times a day as needed, Disp: 2 Tube, Rfl: 1 ?  dicyclomine (BENTYL) 10 MG capsule, Take 1-2 capsules (10-20 mg total) by mouth 4 (four) times daily -  before meals and at bedtime., Disp: 90 capsule, Rfl: 1 ?  ergocalciferol (VITAMIN D2) 1.25 MG (50000 UT) capsule, Take 1 capsule (50,000 Units total) by mouth once a week., Disp: 12 capsule, Rfl: 1 ?  etonogestrel-ethinyl estradiol (NUVARING) 0.12-0.015 MG/24HR vaginal ring, Insert 1 vaginal ring every month by vaginal route., Disp: 1 each, Rfl: 11 ?  Fluticasone-Salmeterol (ADVAIR) 250-50 MCG/DOSE AEPB, INHALE 1 PUFF AS DIRECTED TWICE A DAY, Disp: 60 each, Rfl: 2 ?  gabapentin (NEURONTIN) 300 MG capsule, Take 1 capsule (300 mg total) by mouth 3  (three) times daily., Disp: 90 capsule, Rfl: 11 ?  Galcanezumab-gnlm (EMGALITY) 120 MG/ML SOAJ, Inject 120 mg into the skin every 30 (thirty) days., Disp: 1 mL, Rfl: 11 ?  linaclotide (LINZESS) 145 MCG CAPS capsule, Take 1 capsule (145 mcg total) by mouth daily before breakfast., Disp: 30 capsule, Rfl: 1 ?  meloxicam (MOBIC) 15 MG tablet, Take 1 tablet (15 mg total) by mouth daily., Disp: 30 tablet, Rfl: 3 ?  ondansetron (ZOFRAN-ODT) 4 MG disintegrating tablet, Dissolve 1-2 tablets (4-8 mg total) by mouth every 8 (eight) hours as needed for nausea or vomiting., Disp: 30 tablet, Rfl: 1 ?  prednisoLONE acetate (PRED FORTE) 1 % ophthalmic suspension, Place 1 drop into the right eye 4 (four) times daily. (Patient taking differently: Place 1 drop into the right eye 4 (four) times daily as needed.), Disp: 10 mL, Rfl: 0 ?  rizatriptan (MAXALT-MLT) 10 MG disintegrating tablet, Dissolve 1 tablet (10 mg total) by mouth as needed for migraine. May repeat in 2 hours if needed, Disp: 9 tablet, Rfl: 7 ?  Semaglutide-Weight Management (WEGOVY) 1 MG/0.5ML SOAJ, Inject 1 mg into the skin once a week., Disp: 2 mL, Rfl: 1 ?  tiZANidine (ZANAFLEX) 2 MG tablet, Take 1-2 tablets (2-4 mg total) by mouth at bedtime for spasticity, Disp: 60 tablet, Rfl: 5  ? ?Medications ordered in this encounter:  ?Meds ordered this encounter  ?Medications  ? azithromycin (ZITHROMAX) 250 MG tablet  ?  Sig: Take 2 tablets on day 1, then 1 tablet daily on days 2 through 5  ?  Dispense:  6 tablet  ?  Refill:  0  ?  Order Specific Question:   Supervising Provider  ?  Answer:   Noemi Chapel [[id number]]  ?  ? ?*If you need refills on other medications prior to your next appointment, please contact your pharmacy* ? ?Follow-Up: ?Call back or seek an in-person evaluation if the symptoms worsen or if the condition fails to improve as anticipated. ? ?Other Instructions ?Acute Bronchitis, Adult ? ?Acute bronchitis is sudden inflammation of the main airways (bronchi) that  come off the windpipe (trachea) in the lungs. The swelling causes the airways to get smaller and make more mucus than normal. This can make it hard to breathe and can cause coughing or noisy breathing (wheezing). ?Acute bronchitis may last several weeks. The cough may last longer. Allergies, asthma, and exposure to smoke may make the condition worse. ?What are the causes? ?This condition can be caused by germs and by substances that irritate the lungs, including: ?Cold and flu viruses. The most common cause of this condition is the virus that causes the common cold. ?Bacteria. This is less common. ?Breathing in substances that irritate the lungs, including: ?Smoke from cigarettes and other forms of tobacco. ?Dust and pollen. ?Fumes from household cleaning products, gases, or burned fuel. ?Indoor or outdoor air pollution. ?What increases the risk? ?The following factors may make you more likely to develop this condition: ?A weak body's defense system, also called the immune system. ?A condition that affects your lungs and breathing, such as asthma. ?What are the signs or symptoms? ?Common symptoms of this condition include: ?Coughing. This may bring up clear, yellow, or green mucus from your lungs (sputum). ?Wheezing. ?Runny or stuffy nose. ?Having too much mucus in your lungs (chest congestion). ?Shortness of breath. ?Aches and pains, including sore throat or chest. ?How is this diagnosed? ?This condition is usually diagnosed based on: ?Your symptoms and medical history. ?A physical exam. ?You may also have other tests, including tests to rule out other conditions, such as pneumonia. These tests include: ?A test of lung function. ?Test of a mucus sample to look for the presence of bacteria. ?Tests to check the oxygen level in your blood. ?Blood tests. ?Chest X-ray. ?How is this treated? ?Most cases of acute bronchitis clear up over time without treatment. Your health care provider may recommend: ?Drinking more  fluids to help thin your mucus so it is easier to cough up. ?Taking inhaled medicine (inhaler) to improve air flow in and out of your lungs. ?Using a vaporizer or a humidifier. These are machines that add water to the air to help you breathe better. ?Taking a medicine that thins mucus and clears congestion (expectorant). ?Taking a medicine that prevents or stops coughing (cough suppressant). ?It is notcommon to take an antibiotic medicine for this condition. ?Follow these instructions at home: ? ?Take over-the-counter and prescription medicines only as told by your health care provider. ?Use an inhaler, vaporizer, or humidifier as told by your health care provider. ?Take two teaspoons (10 mL) of honey at bedtime to lessen coughing at night. ?Drink enough fluid to keep your urine pale yellow. ?Do not use any products that contain nicotine or tobacco. These products include cigarettes, chewing tobacco, and vaping devices, such as e-cigarettes. If you need help quitting, ask your health care provider. ?Get plenty of rest. ?Return to your normal activities as told  by your health care provider. Ask your health care provider what activities are safe for you. ?Keep all follow-up visits. This is important. ?How is this prevented? ?To lower your risk of getting this condition again: ?Wash your hands often with soap and water for at least 20 seconds. If soap and water are not available, use hand sanitizer. ?Avoid contact with people who have cold symptoms. ?Try not to touch your mouth, nose, or eyes with your hands. ?Avoid breathing in smoke or chemical fumes. Breathing smoke or chemical fumes will make your condition worse. ?Get the flu shot every year. ?Contact a health care provider if: ?Your symptoms do not improve after 2 weeks. ?You have trouble coughing up the mucus. ?Your cough keeps you awake at night. ?You have a fever. ?Get help right away if you: ?Cough up blood. ?Feel pain in your chest. ?Have severe shortness of  breath. ?Faint or keep feeling like you are going to faint. ?Have a severe headache. ?Have a fever or chills that get worse. ?These symptoms may represent a serious problem that is an emergency. Do not wait to

## 2022-01-17 NOTE — Progress Notes (Signed)
?Virtual Visit Consent  ? ?Ashley Reed, you are scheduled for a virtual visit with a [facility] provider today.   ?  ?Just as with appointments in the office, your consent must be obtained to participate.  Your consent will be active for this visit and any virtual visit you may have with one of our providers in the next 365 days.   ?  ?If you have a MyChart account, a copy of this consent can be sent to you electronically.  All virtual visits are billed to your insurance company just like a traditional visit in the office.   ? ?As this is a virtual visit, video technology does not allow for your provider to perform a traditional examination.  This may limit your provider's ability to fully assess your condition.  If your provider identifies any concerns that need to be evaluated in person or the need to arrange testing (such as labs, EKG, etc.), we will make arrangements to do so.   ?  ?Although advances in technology are sophisticated, we cannot ensure that it will always work on either your end or our end.  If the connection with a video visit is poor, the visit may have to be switched to a telephone visit.  With either a video or telephone visit, we are not always able to ensure that we have a secure connection.   ? ?Also, by engaging in this virtual visit, you consent to the provision of healthcare. Additionally, you authorize for your insurance to be billed (if applicable) for the services provided during this visit. I also discussed with the patient that there may be a patient responsible charge related to this service. ? ?I need to obtain your verbal consent now.   Are you willing to proceed with your visit today?  ?  ?Ashley Reed has provided verbal consent on 01/17/2022 for a virtual visit (video or telephone). ?  ?Mar Daring, PA-C  ? ?Date: 01/17/2022 8:48 AM ? ? ?Virtual Visit via Video Note  ? ?Ashley Reed, connected with  Ashley Reed  ([id number], Aug 01, 2001) on 01/17/22  at  8:45 AM EDT by a video-enabled telemedicine application and verified that I am speaking with the correct person using two identifiers. ? ?Location: ?Patient: Virtual Visit Location Patient: Mobile ?Provider: Virtual Visit Location Provider: Home Office ?  ?I discussed the limitations of evaluation and management by telemedicine and the availability of in person appointments. The patient expressed understanding and agreed to proceed.   ? ?History of Present Illness: ?Ashley Reed is a 21 y.o. who identifies as a female who was assigned female at birth, and is being seen today for cough and congestion. ? ?HPI: Cough ?This is a new problem. The current episode started 1 to 4 weeks ago. The problem has been gradually worsening. The problem occurs every few minutes. The cough is Productive of sputum. Associated symptoms include headaches, nasal congestion and postnasal drip. Pertinent negatives include no chills, fever, rhinorrhea or sore throat. Nothing aggravates the symptoms. She has tried a beta-agonist inhaler, oral steroids and rest (netti pot, steam, OTC cough medications) for the symptoms. The treatment provided no relief.   ? ? ?Problems:  ?Patient Active Problem List  ? Diagnosis Date Noted  ? Noninfectious gastroenteritis 12/26/2021  ? LLQ cramping 12/26/2021  ? Acne vulgaris 10/13/2021  ? Neck pain 08/22/2021  ? Rash and nonspecific skin eruption 07/27/2021  ? Folliculitis 40/97/3532  ? Impaired fasting glucose 07/13/2021  ?  Dysuria 07/13/2021  ? Encounter for general adult medical examination with abnormal findings 06/11/2021  ? Alopecia 06/11/2021  ? Fatigue 06/11/2021  ? Vitamin D deficiency 06/11/2021  ? Body mass index (BMI) of 33.0-33.9 in adult 06/11/2021  ? Abnormal cervical Papanicolaou smear 05/03/2021  ? Encounter to establish care 05/03/2021  ? Multiple sclerosis ([place]) 05/03/2021  ? Relapsing remitting multiple sclerosis ([place]) 04/26/2021  ? High risk medication use 04/26/2021  ? Migraine  without aura and without status migrainosus, not intractable 04/26/2021  ? Dysesthesia 04/26/2021  ? 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  ? B12 deficiency 03/09/2021  ? 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 Paraplegia, unspecified ([place]) 01/06/2021  ? Asthma 10/20/2019  ? Migraine 10/20/2019  ?  ?Allergies: No Known Allergies ?Medications:  ?Current Outpatient Medications:  ?  azithromycin (ZITHROMAX) 250 MG tablet, Take 2 tablets on day 1, then 1 tablet daily on days 2 through 5, Disp: 6 tablet, Rfl: 0 ?  adapalene (DIFFERIN) 0.1 % cream, APPLY PEA SIZED AMOUNT TO FINGER AND SPREAD THIN LAYER TO ACNE AREAS ONCE A DAY IN MORNING, Disp: 45 g, Rfl: 1 ?  albuterol (PROVENTIL HFA;VENTOLIN HFA) 108 (90 Base) MCG/ACT inhaler, Inhale 2 puffs 15 minutes prior to exercise and every 4 hours prn cough, wheeze with asthma flare, Disp: , Rfl:  ?  B Complex Vitamins (VITAMIN B COMPLEX PO), Take by mouth., Disp: , Rfl:  ?  cetirizine (ZYRTEC) 10 MG tablet, Take 10 mg by mouth daily., Disp: , Rfl:  ?  Cyanocobalamin (VITAMIN B-12 PO), Take by mouth., Disp: , Rfl:  ?  diclofenac sodium (VOLTAREN) 1 % GEL, Apply to large joint area up to three times a day as needed, Disp: 2 Tube, Rfl: 1 ?  dicyclomine (BENTYL) 10 MG capsule, Take 1-2 capsules (10-20 mg total) by mouth 4 (four) times daily -  before meals and at bedtime., Disp: 90 capsule, Rfl: 1 ?  ergocalciferol (VITAMIN D2) 1.25 MG (50000 UT) capsule, Take 1 capsule (50,000 Units total) by mouth once a week., Disp: 12 capsule, Rfl: 1 ?  etonogestrel-ethinyl estradiol (NUVARING) 0.12-0.015 MG/24HR vaginal ring, Insert 1 vaginal ring every month by vaginal route., Disp: 1 each, Rfl: 11 ?  Fluticasone-Salmeterol (ADVAIR) 250-50 MCG/DOSE AEPB, INHALE 1 PUFF AS DIRECTED TWICE A DAY, Disp: 60 each, Rfl: 2 ?  gabapentin (NEURONTIN) 300 MG capsule, Take 1 capsule (300 mg total) by mouth 3 (three) times daily., Disp: 90 capsule, Rfl: 11 ?  Galcanezumab-gnlm (EMGALITY) 120 MG/ML SOAJ, Inject 120  mg into the skin every 30 (thirty) days., Disp: 1 mL, Rfl: 11 ?  linaclotide (LINZESS) 145 MCG CAPS capsule, Take 1 capsule (145 mcg total) by mouth daily before breakfast., Disp: 30 capsule, Rfl: 1 ?  meloxicam (MOBIC) 15 MG tablet, Take 1 tablet (15 mg total) by mouth daily., Disp: 30 tablet, Rfl: 3 ?  ondansetron (ZOFRAN-ODT) 4 MG disintegrating tablet, Dissolve 1-2 tablets (4-8 mg total) by mouth every 8 (eight) hours as needed for nausea or vomiting., Disp: 30 tablet, Rfl: 1 ?  prednisoLONE acetate (PRED FORTE) 1 % ophthalmic suspension, Place 1 drop into the right eye 4 (four) times daily. (Patient taking differently: Place 1 drop into the right eye 4 (four) times daily as needed.), Disp: 10 mL, Rfl: 0 ?  rizatriptan (MAXALT-MLT) 10 MG disintegrating tablet, Dissolve 1 tablet (10 mg total) by mouth as needed for migraine. May repeat in 2 hours if needed, Disp: 9 tablet, Rfl: 7 ?  Semaglutide-Weight Management (WEGOVY) 1  MG/0.5ML SOAJ, Inject 1 mg into the skin once a week., Disp: 2 mL, Rfl: 1 ?  tiZANidine (ZANAFLEX) 2 MG tablet, Take 1-2 tablets (2-4 mg total) by mouth at bedtime for spasticity, Disp: 60 tablet, Rfl: 5 ? ?Observations/Objective: ?Patient is well-developed, well-nourished in no acute distress.  ?Resting comfortably  ?Head is normocephalic, atraumatic.  ?No labored breathing.  ?Speech is clear and coherent with logical content.  ?Patient is alert and oriented at baseline.  ? ? ?Assessment and Plan: ?1. Acute bacterial bronchitis ?- azithromycin (ZITHROMAX) 250 MG tablet; Take 2 tablets on day 1, then 1 tablet daily on days 2 through 5  Dispense: 6 tablet; Refill: 0 ? ?- Suspect bacterial bronchitis since she has failed other treatments ?- Zpack added ?- Continue other medications as prescribed ?- Push fluids ?- Steam and humidifier ?- Saline nasal rinses ?- Cough drops as needed ?- Seek in person evaluation if not improving or worsening ? ?Follow Up Instructions: ?I discussed the assessment and  treatment plan with the patient. The patient was provided an opportunity to ask questions and all were answered. The patient agreed with the plan and demonstrated an understanding of the instructions.

## 2022-01-20 DIAGNOSIS — K5904 Chronic idiopathic constipation: Secondary | ICD-10-CM | POA: Insufficient documentation

## 2022-01-22 ENCOUNTER — Other Ambulatory Visit (HOSPITAL_COMMUNITY): Payer: Self-pay

## 2022-01-25 ENCOUNTER — Other Ambulatory Visit (HOSPITAL_COMMUNITY): Payer: Self-pay

## 2022-02-06 ENCOUNTER — Other Ambulatory Visit (HOSPITAL_COMMUNITY): Payer: Self-pay

## 2022-02-07 ENCOUNTER — Other Ambulatory Visit: Payer: Self-pay | Admitting: Nurse Practitioner

## 2022-02-07 ENCOUNTER — Telehealth: Payer: Self-pay | Admitting: Nurse Practitioner

## 2022-02-07 ENCOUNTER — Other Ambulatory Visit (HOSPITAL_COMMUNITY): Payer: Self-pay

## 2022-02-07 DIAGNOSIS — K5904 Chronic idiopathic constipation: Secondary | ICD-10-CM

## 2022-02-07 DIAGNOSIS — R1032 Left lower quadrant pain: Secondary | ICD-10-CM

## 2022-02-07 MED ORDER — LINACLOTIDE 145 MCG PO CAPS
145.0000 ug | ORAL_CAPSULE | Freq: Every day | ORAL | 1 refills | Status: DC
  Filled 2022-02-07 (×2): qty 90, 90d supply, fill #0
  Filled 2022-05-08: qty 90, 90d supply, fill #1

## 2022-02-07 MED ORDER — METRONIDAZOLE 500 MG PO TABS
500.0000 mg | ORAL_TABLET | Freq: Four times a day (QID) | ORAL | 0 refills | Status: DC
  Filled 2022-02-07: qty 28, 7d supply, fill #0

## 2022-02-07 MED ORDER — LINACLOTIDE 145 MCG PO CAPS
145.0000 ug | ORAL_CAPSULE | Freq: Every day | ORAL | 1 refills | Status: DC
  Filled 2022-02-07: qty 60, 60d supply, fill #0

## 2022-02-07 NOTE — Telephone Encounter (Signed)
Changed linzess to 90 day prescription and sent to [facility] outpatient pharmacy

## 2022-02-07 NOTE — Telephone Encounter (Signed)
Patient wants a 67-monthsupply sent into pharmacy for her Linzess.  The cost is the same whether it's a one month or a three month supply, so patient would prefer to have a three month supply.    Rx #: [id number] linaclotide (Rolan Lipa 145 MCG CAPS capsule [[[id number]]  Order Details Dose: 145 mcg Route: Oral Frequency: Daily before breakfast  Dispense Quantity: 30 capsule Refills: 1 Fills remaining: 0  Note to Pharmacy: Miralax has been ineffective  Indications of Use: Constipation associated with Irritable Bowel Syndrome       Sig: Take 1 capsule (145 mcg total) by mouth daily before breakfast.       Start Date: 02/07/22 End Date: --  Written Date: 02/07/22 Expiration Date: 02/07/23

## 2022-02-07 NOTE — Progress Notes (Signed)
Changed linzess to 90 day prescription and sent to [facility] outpatient pharmacy

## 2022-02-08 NOTE — Telephone Encounter (Signed)
Pt is advised of her Rx that was sent

## 2022-02-09 ENCOUNTER — Other Ambulatory Visit (HOSPITAL_COMMUNITY): Payer: Self-pay

## 2022-02-13 ENCOUNTER — Other Ambulatory Visit (HOSPITAL_COMMUNITY): Payer: Self-pay

## 2022-02-19 ENCOUNTER — Encounter: Payer: Self-pay | Admitting: Neurology

## 2022-02-19 ENCOUNTER — Ambulatory Visit (INDEPENDENT_AMBULATORY_CARE_PROVIDER_SITE_OTHER): Payer: 59 | Admitting: Neurology

## 2022-02-19 ENCOUNTER — Other Ambulatory Visit (HOSPITAL_COMMUNITY): Payer: Self-pay

## 2022-02-19 VITALS — BP 137/77 | HR 88 | Ht 69.0 in | Wt 210.5 lb

## 2022-02-19 DIAGNOSIS — G35 Multiple sclerosis: Secondary | ICD-10-CM

## 2022-02-19 DIAGNOSIS — M542 Cervicalgia: Secondary | ICD-10-CM

## 2022-02-19 DIAGNOSIS — G4489 Other headache syndrome: Secondary | ICD-10-CM

## 2022-02-19 DIAGNOSIS — N319 Neuromuscular dysfunction of bladder, unspecified: Secondary | ICD-10-CM

## 2022-02-19 DIAGNOSIS — R208 Other disturbances of skin sensation: Secondary | ICD-10-CM

## 2022-02-19 DIAGNOSIS — Z79899 Other long term (current) drug therapy: Secondary | ICD-10-CM | POA: Diagnosis not present

## 2022-02-19 DIAGNOSIS — G43009 Migraine without aura, not intractable, without status migrainosus: Secondary | ICD-10-CM

## 2022-02-19 MED ORDER — SILODOSIN 4 MG PO CAPS
4.0000 mg | ORAL_CAPSULE | Freq: Every day | ORAL | 5 refills | Status: DC
  Filled 2022-02-19: qty 30, 30d supply, fill #0

## 2022-02-19 NOTE — Telephone Encounter (Signed)
I spoke with Dr. Felecia Shelling.  He is agreeable to working her in this afternoon for a nerve block.  I called patient.  She is agreeable to coming today at 4 PM for injections.

## 2022-02-19 NOTE — Progress Notes (Signed)
[facility]  PATIENT: Ashley Reed DOB: 21-Sep-2000  REFERRING DOCTOR OR PCP:  Nathaneil Canary, MD SOURCE: Patient, notes from [facility], imaging and lab results, MRI images personally reviewed  _________________________________   HISTORICAL  CHIEF COMPLAINT:  Chief Complaint  Patient presents with   Follow-up    Rm 1, alone. Here for nerve block inj.     donnalee, cellucci a 21 y.o. woman with multiple sclerosis and headaches.  Update 02/19/2022: At the last visit, she was experiencing daily severe headaches, predominantly in the occiput radiating forward.  Splenius capitis trigger point injections/occipital nerve blocks greatly helped her pain until a few weeks ago.  More recently, pain has returned and is similar to what she was experiencing back in January.   Pain is bilateral.   She had stopped gabapentin recently.   She just takes meloxican prn.   She does continue tizanidine 4 mg nightly.   She continues on [employer].    Her MS comes continues to be stable.  Her first Ocrevus infusion were July 2022 and next one will be July 207, 2023.    She tolerated it ok --- mild nausea.      Currently gait is doing about the same.    She stumbles more but has no falls.   The legs are symmetric.    She has some leg pain developing at night awakening her.    She sometimes drops items out of hands.   She has more urinary dysfunction.   She has hesitancy and trouble with incomplete emptying.   Flomax was not tolerated and did not help her much during the couple days that she took it.        Cognition is fine.   She has fatigue but is less sleepy on modafinil.     She sleeps well at night.  She denies mood issues.    She has had daily more severe headaches over the last month.  The worst pain is at the occiput and radiates forward.  She has had lower back pain x many years.   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   She has had LBP and occasional leg pain.   A few times she has had her back lock up due to muscle spasms.    This improved with muscle relaxant.    MS HISTORY: She is a 21 year old woman who had numbness from the waist down lasting 1 month in January and February.    She did not have any balance issues.   She also had mild urinary retention.   In April, she had the onset of numbness form the breast down and also had poor balance.   With symptoms persisting she saw the ED at [facility] break but MRI was not up.   She returned to school at [employer] and had an MRI performed 01/06/2021.  This was about 3 weeks after the onset of symptoms.   She was admitted to [facility] and did 7 days of IV Solu-medrol.  Symptoms improved.    She saw Dr. Concha Pyo.   She was started on Ocrevus.  Her last infusion was March 30, 2021.  She noticed issues with stomach pain, nausea lasting a couple weeks from the infusion.  Additionally she had some hair loss since the last infusion.   Imaging personally reviewed (and shown to patient): MRI of the brain 01/06/2021 shows multiple T2/FLAIR hyperintense foci,  predominantly in the periventricular white matter including the callosal septal fibers.  1 focus is also in the frontal juxtacortical white matter on the left.  None of the foci enhance.    Cervical, thoracic and lumbar spine MRI 01/06/2021 showed a large T2 hyperintense field is adjacent to T6.  It showed faint enhancement degenerative changes are noted at L3-L4, L4-L5 and L5-S1.[name]  There is spinal stenosis at L3-L4 .      Pertinent laboratory tests: 01/07/2021: CSF showed 3 oligoclonal bands.  Myelin basic protein was elevated. 02/10/2021: Hepatitis panel was negative.  Vitamin D was mildly low.  TSH was normal REVIEW OF SYSTEMS: Constitutional: No fevers, chills, sweats, or change in appetite Eyes: No visual changes, double vision, eye pain Ear, nose and throat: No hearing loss, ear pain, nasal congestion, sore  throat Cardiovascular: No chest pain, palpitations Respiratory:  No shortness of breath at rest or with exertion.   No wheezes GastrointestinaI: No nausea, vomiting, diarrhea, abdominal pain, fecal incontinence Genitourinary:  No dysuria, urinary retention or frequency.  No nocturia. Musculoskeletal:  No neck pain, back pain Integumentary: No rash, pruritus, skin lesions Neurological: as above Psychiatric: No depression at this time.  No anxiety Endocrine: No palpitations, diaphoresis, change in appetite, change in weigh or increased thirst Hematologic/Lymphatic:  No anemia, purpura, petechiae. Allergic/Immunologic: No itchy/runny eyes, nasal congestion, recent allergic reactions, rashes  ALLERGIES: No Known Allergies  HOME MEDICATIONS:  Current Outpatient Medications:    adapalene (DIFFERIN) 0.1 % cream, APPLY PEA SIZED AMOUNT TO FINGER AND SPREAD THIN LAYER TO ACNE AREAS ONCE A DAY IN MORNING, Disp: 45 g, Rfl: 1   albuterol (PROVENTIL HFA;VENTOLIN HFA) 108 (90 Base) MCG/ACT inhaler, Inhale 2 puffs 15 minutes prior to exercise and every 4 hours prn cough, wheeze with asthma flare, Disp: , Rfl:    B Complex Vitamins (VITAMIN B COMPLEX PO), Take by mouth., Disp: , Rfl:    cetirizine (ZYRTEC) 10 MG tablet, Take 10 mg by mouth daily., Disp: , Rfl:    Cyanocobalamin (VITAMIN B-12 PO), Take by mouth., Disp: , Rfl:    diclofenac sodium (VOLTAREN) 1 % GEL, Apply to large joint area up to three times a day as needed, Disp: 2 Tube, Rfl: 1   dicyclomine (BENTYL) 10 MG capsule, Take 1-2 capsules (10-20 mg total) by mouth 4 (four) times daily -  before meals and at bedtime., Disp: 90 capsule, Rfl: 1   ergocalciferol (VITAMIN D2) 1.25 MG (50000 UT) capsule, Take 1 capsule (50,000 Units total) by mouth once a week., Disp: 12 capsule, Rfl: 1   etonogestrel-ethinyl estradiol (NUVARING) 0.12-0.015 MG/24HR vaginal ring, Insert 1 vaginal ring every month by vaginal route., Disp: 1 each, Rfl: 11   gabapentin  (NEURONTIN) 300 MG capsule, Take 1 capsule (300 mg total) by mouth 3 (three) times daily., Disp: 90 capsule, Rfl: 11   Galcanezumab-gnlm (EMGALITY) 120 MG/ML SOAJ, Inject 120 mg into the skin every 30 (thirty) days., Disp: 1 mL, Rfl: 11   linaclotide (LINZESS) 145 MCG CAPS capsule, Take 1 capsule (145 mcg total) by mouth daily before breakfast., Disp: 90 capsule, Rfl: 1   meloxicam (MOBIC) 15 MG tablet, Take 1 tablet (15 mg total) by mouth daily., Disp: 30 tablet, Rfl: 3   ondansetron (ZOFRAN-ODT) 4 MG disintegrating tablet, Dissolve 1-2 tablets (4-8 mg total) by mouth every 8 (eight) hours as needed for nausea or vomiting., Disp: 30 tablet, Rfl: 1   prednisoLONE acetate (PRED FORTE) 1 % ophthalmic suspension, Place 1 drop  into the right eye 4 (four) times daily. (Patient taking differently: Place 1 drop into the right eye 4 (four) times daily as needed.), Disp: 10 mL, Rfl: 0   rizatriptan (MAXALT-MLT) 10 MG disintegrating tablet, Dissolve 1 tablet (10 mg total) by mouth as needed for migraine. May repeat in 2 hours if needed, Disp: 9 tablet, Rfl: 7   Semaglutide-Weight Management (WEGOVY) 1 MG/0.5ML SOAJ, Inject 1 mg into the skin once a week., Disp: 2 mL, Rfl: 1   silodosin (RAPAFLO) 4 MG CAPS capsule, Take 1 capsule (4 mg total) by mouth daily with breakfast., Disp: 60 capsule, Rfl: 5   tiZANidine (ZANAFLEX) 2 MG tablet, Take 1-2 tablets (2-4 mg total) by mouth at bedtime for spasticity, Disp: 60 tablet, Rfl: 5   Fluticasone-Salmeterol (ADVAIR) 250-50 MCG/DOSE AEPB, INHALE 1 PUFF AS DIRECTED TWICE A DAY, Disp: 60 each, Rfl: 2  PAST MEDICAL HISTORY: Past Medical History:  Diagnosis Date   Asthma    MS (multiple sclerosis) ([place]) 01/07/2021    PAST SURGICAL HISTORY: Past Surgical History:  Procedure Laterality Date   BREAST REDUCTION SURGERY      FAMILY HISTORY: Family History  Problem Relation Age of Onset   Fibromyalgia Mother    Bipolar disorder Father    Asthma Brother    Asthma  Brother    Asthma Brother    Mental illness Brother     SOCIAL HISTORY:  Social History   Socioeconomic History   Marital status: Single    Spouse name: Not on file   Number of children: Not on file   Years of education: Not on file   Highest education level: Bachelor's degree (e.g., BA, AB, BS)  Occupational History   Not on file  Tobacco Use   Smoking status: Never   Smokeless tobacco: Never  Vaping Use   Vaping Use: Never used  Substance and Sexual Activity   Alcohol use: Yes    Comment: on occasion   Drug use: Not Currently   Sexual activity: Not on file  Other Topics Concern   Not on file  Social History Narrative   Lives with mother   Right handed   Caffeine: 1-2 coffee in a week   Social Determinants of Health   Financial Resource Strain: Not on file  Food Insecurity: Not on file  Transportation Needs: Not on file  Physical Activity: Not on file  Stress: Not on file  Social Connections: Not on file  Intimate Partner Violence: Not on file     PHYSICAL EXAM  Vitals:   02/19/22 1556  BP: 137/77  Pulse: 88  Weight: 210 lb 8 oz (95.5 kg)  Height: '5\' 9"'$  (1.753 m)    Body mass index is 31.09 kg/m.  No results found.   General: The patient is well-developed and well-nourished and in no acute distress  HEENT:  Head is [place]/AT.  Sclera are anicteric.     Skin: Extremities are without rash or  edema.  Musculoskeletal: There is tenderness over the occiput/splenius capitis muscles.  Range of motion was normal.  Neurologic Exam  Mental status: The patient is alert and oriented x 3 at the time of the examination. The patient has apparent normal recent and remote memory, with an apparently normal attention span and concentration ability.   Speech is normal.  Cranial nerves: Extraocular movements are full.  Facial strength and sesation were noral No obvious hearing deficits are noted.  Motor:  Muscle bulk is normal.   Tone is  normal. Strength is  5 / 5  in all 4 extremities.   Sensory: Sensory testing is intact to vibration sensation in all 4 extremities.  She has allodynia to cold temperature and altered sensation to touch below the T7 level.  Coordination: Cerebellar testing reveals good finger-nose-finger and heel-to-shin bilaterally.  Gait and station: Station is normal.   Gait is normal. Tandem gait is normal. Romberg is negative.   Reflexes: Deep tendon reflexes are symmetric and normal bilaterally.       DIAGNOSTIC DATA (LABS, IMAGING, TESTING) - I reviewed patient records, labs, notes, testing and imaging myself where available.  Lab Results  Component Value Date   WBC 5.8 08/22/2021   HGB 15.0 08/22/2021   HCT 43.3 08/22/2021   MCV 91 08/22/2021   PLT 285 08/22/2021      Component Value Date/Time   NA 138 05/31/2021 0823   K 4.4 05/31/2021 0823   CL 100 05/31/2021 0823   CO2 22 05/31/2021 0823   GLUCOSE 74 05/31/2021 0823   GLUCOSE 88 01/01/2021 1025   BUN 12 05/31/2021 0823   CREATININE 0.85 05/31/2021 0823   CALCIUM 8.9 05/31/2021 0823   PROT 6.7 01/01/2021 1025   ALBUMIN 4.1 01/01/2021 1025   AST 13 (L) 01/01/2021 1025   ALT 10 01/01/2021 1025   ALKPHOS 54 01/01/2021 1025   BILITOT 0.6 01/01/2021 1025   GFRNONAA >60 01/01/2021 1025   No results found for: CHOL, HDL, LDLCALC, LDLDIRECT, TRIG, CHOLHDL No results found for: HGBA1C Lab Results  Component Value Date   HBZJIRCV89 381 05/31/2021   Lab Results  Component Value Date   TSH 2.440 05/31/2021       ASSESSMENT AND PLAN  Multiple sclerosis ([place]) - Plan: CD20 B Cells, CBC with Differential/Platelet, Hepatic function panel  High risk medication use - Plan: CD20 B Cells, CBC with Differential/Platelet, Hepatic function panel  Dysesthesia  Migraine without aura and without status migrainosus, not intractable  Neck pain  Bladder dysfunction  Other headache syndrome   Continue Ocrevus.  Check lab work today.  Her next infusion is in a  couple weeks. Bilateral splenius capitus TPI with 80 mg DepoMedrol in [place] using sterile technique.   She tolerated the procedure well and there were no complications.  Continue tizanidine to help with the headaches. Rapaflo for urinary hesitancy Return in 6 months or sooner if there are new or worsening neurologic symptoms.  Teva Bronkema A. Felecia Shelling, MD, [facility] 0/09/7508, 2:58 PM Certified in Neurology, Clinical Neurophysiology, Sleep Medicine and Neuroimaging  [facility] [address] [place], [place] [address] [phone]

## 2022-02-20 DIAGNOSIS — G35 Multiple sclerosis: Secondary | ICD-10-CM | POA: Diagnosis not present

## 2022-02-20 DIAGNOSIS — Z79899 Other long term (current) drug therapy: Secondary | ICD-10-CM | POA: Diagnosis not present

## 2022-02-21 LAB — CBC WITH DIFFERENTIAL/PLATELET
Basophils Absolute: 0 10*3/uL (ref 0.0–0.2)
Basos: 0 %
EOS (ABSOLUTE): 0.1 10*3/uL (ref 0.0–0.4)
Eos: 1 %
Hematocrit: 42.2 % (ref 34.0–46.6)
Hemoglobin: 14.8 g/dL (ref 11.1–15.9)
Immature Grans (Abs): 0 10*3/uL (ref 0.0–0.1)
Immature Granulocytes: 1 %
Lymphocytes Absolute: 1.4 10*3/uL (ref 0.7–3.1)
Lymphs: 22 %
MCH: 31.5 pg (ref 26.6–33.0)
MCHC: 35.1 g/dL (ref 31.5–35.7)
MCV: 90 fL (ref 79–97)
Monocytes Absolute: 0.3 10*3/uL (ref 0.1–0.9)
Monocytes: 5 %
Neutrophils Absolute: 4.6 10*3/uL (ref 1.4–7.0)
Neutrophils: 71 %
Platelets: 315 10*3/uL (ref 150–450)
RBC: 4.7 x10E6/uL (ref 3.77–5.28)
RDW: 12.2 % (ref 11.7–15.4)
WBC: 6.4 10*3/uL (ref 3.4–10.8)

## 2022-02-21 LAB — HEPATIC FUNCTION PANEL
ALT: 15 IU/L (ref 0–32)
AST: 28 IU/L (ref 0–40)
Albumin: 4.8 g/dL (ref 3.9–5.0)
Alkaline Phosphatase: 68 IU/L (ref 44–121)
Bilirubin Total: 0.5 mg/dL (ref 0.0–1.2)
Bilirubin, Direct: <0.1 mg/dL (ref 0.00–0.40)
Total Protein: 7.3 g/dL (ref 6.0–8.5)

## 2022-02-21 LAB — CD20 B CELLS
% CD19-B Cells: 0 % — ABNORMAL LOW (ref 4.6–22.1)
% CD20-B Cells: 0 % — ABNORMAL LOW (ref 5.0–22.3)

## 2022-02-28 ENCOUNTER — Other Ambulatory Visit (HOSPITAL_COMMUNITY): Payer: Self-pay

## 2022-02-28 ENCOUNTER — Ambulatory Visit: Payer: 59 | Admitting: Neurology

## 2022-03-01 ENCOUNTER — Ambulatory Visit: Payer: 59 | Admitting: Nurse Practitioner

## 2022-03-08 ENCOUNTER — Ambulatory Visit (INDEPENDENT_AMBULATORY_CARE_PROVIDER_SITE_OTHER): Payer: 59 | Admitting: Nurse Practitioner

## 2022-03-08 ENCOUNTER — Encounter: Payer: Self-pay | Admitting: Nurse Practitioner

## 2022-03-08 ENCOUNTER — Other Ambulatory Visit (HOSPITAL_COMMUNITY): Payer: Self-pay

## 2022-03-08 VITALS — BP 107/69 | HR 70 | Temp 98.1°F | Ht 68.9 in | Wt 206.1 lb

## 2022-03-08 DIAGNOSIS — G35 Multiple sclerosis: Secondary | ICD-10-CM

## 2022-03-08 DIAGNOSIS — Z6830 Body mass index (BMI) 30.0-30.9, adult: Secondary | ICD-10-CM | POA: Diagnosis not present

## 2022-03-08 DIAGNOSIS — K5904 Chronic idiopathic constipation: Secondary | ICD-10-CM

## 2022-03-08 MED ORDER — SEMAGLUTIDE-WEIGHT MANAGEMENT 1.7 MG/0.75ML ~~LOC~~ SOAJ
1.7000 mg | SUBCUTANEOUS | 1 refills | Status: DC
  Filled 2022-03-08: qty 3, 28d supply, fill #0
  Filled 2022-03-31 – 2022-04-05 (×6): qty 3, 28d supply, fill #1

## 2022-03-08 NOTE — Progress Notes (Signed)
Established patient visit   Patient: Ashley Reed   DOB: 04/09/2001   21 y.o. Female  MRN: [id number] Visit Date: 03/08/2022   Chief Complaint  Patient presents with   Weight Check   Subjective    HPI  Started on Ozempic 0.25 mg weekly injections 07/13/2021 to help with managing blood sugar and to help her lose weight.  Was eventually switched to [facility] injections. Was able to titrate to 1 mg weekly. Was held at most recent visit as she was having a great deal of left lower quadrant pain with mix of diarrhea and constipation. She was started on Linzess 145 mcg. States that she is taking this everyday. States that this has really helped her to have more regular bowel movements and abdominal pain was resolved. She had been referred to GI provider. By the time she was contacted to make new patient appointment, symptoms had resolved.   Initial weight 07/13/2021 - 241 Most recent weight 01/11/2022 - 48 Today's weight 03/08/2022 - 206 Weight loss since most recent visit here - 14 pounds Total weight loss since starting weight management program - 35 pounds   Medications: Outpatient Medications Prior to Visit  Medication Sig   adapalene (DIFFERIN) 0.1 % cream APPLY PEA SIZED AMOUNT TO FINGER AND SPREAD THIN LAYER TO ACNE AREAS ONCE A DAY IN MORNING   albuterol (PROVENTIL HFA;VENTOLIN HFA) 108 (90 Base) MCG/ACT inhaler Inhale 2 puffs 15 minutes prior to exercise and every 4 hours prn cough, wheeze with asthma flare   B Complex Vitamins (VITAMIN B COMPLEX PO) Take by mouth.   cetirizine (ZYRTEC) 10 MG tablet Take 10 mg by mouth daily.   Cyanocobalamin (VITAMIN B-12 PO) Take by mouth.   diclofenac sodium (VOLTAREN) 1 % GEL Apply to large joint area up to three times a day as needed   ergocalciferol (VITAMIN D2) 1.25 MG (50000 UT) capsule Take 1 capsule (50,000 Units total) by mouth once a week.   etonogestrel-ethinyl estradiol (NUVARING) 0.12-0.015 MG/24HR vaginal ring Insert 1 vaginal ring  every month by vaginal route.   gabapentin (NEURONTIN) 300 MG capsule Take 1 capsule (300 mg total) by mouth 3 (three) times daily.   linaclotide (LINZESS) 145 MCG CAPS capsule Take 1 capsule (145 mcg total) by mouth daily before breakfast.   meloxicam (MOBIC) 15 MG tablet Take 1 tablet (15 mg total) by mouth daily.   ondansetron (ZOFRAN-ODT) 4 MG disintegrating tablet Dissolve 1-2 tablets (4-8 mg total) by mouth every 8 (eight) hours as needed for nausea or vomiting.   prednisoLONE acetate (PRED FORTE) 1 % ophthalmic suspension Place 1 drop into the right eye 4 (four) times daily. (Patient taking differently: Place 1 drop into the right eye 4 (four) times daily as needed.)   rizatriptan (MAXALT-MLT) 10 MG disintegrating tablet Dissolve 1 tablet (10 mg total) by mouth as needed for migraine. May repeat in 2 hours if needed   silodosin (RAPAFLO) 4 MG CAPS capsule Take 1 capsule (4 mg total) by mouth daily with breakfast.   tiZANidine (ZANAFLEX) 2 MG tablet Take 1-2 tablets (2-4 mg total) by mouth at bedtime for spasticity   [DISCONTINUED] Semaglutide-Weight Management (WEGOVY) 1 MG/0.5ML SOAJ Inject 1 mg into the skin once a week.   Fluticasone-Salmeterol (ADVAIR) 250-50 MCG/DOSE AEPB INHALE 1 PUFF AS DIRECTED TWICE A DAY   [DISCONTINUED] dicyclomine (BENTYL) 10 MG capsule Take 1-2 capsules (10-20 mg total) by mouth 4 (four) times daily -  before meals and at bedtime.   [DISCONTINUED]  Galcanezumab-gnlm (EMGALITY) 120 MG/ML SOAJ Inject 120 mg into the skin every 30 (thirty) days.   No facility-administered medications prior to visit.    Review of Systems  Constitutional:  Negative for activity change, appetite change, chills, fatigue and fever.       14 pound weight loss since her most recent visit here.  HENT:  Negative for congestion, postnasal drip, rhinorrhea, sinus pressure, sinus pain, sneezing and sore throat.   Eyes: Negative.   Respiratory:  Negative for cough, chest tightness, shortness  of breath and wheezing.   Cardiovascular:  Negative for chest pain and palpitations.  Gastrointestinal:  Negative for abdominal pain, constipation, diarrhea, nausea and vomiting.       Intestinal cramping and constipation have resolved since her last visit here   Endocrine: Negative for cold intolerance, heat intolerance, polydipsia and polyuria.  Genitourinary:  Negative for dyspareunia, dysuria, flank pain, frequency and urgency.  Musculoskeletal:  Negative for arthralgias, back pain and myalgias.  Skin:  Negative for rash.  Allergic/Immunologic: Negative for environmental allergies.  Neurological:  Negative for dizziness, weakness and headaches.  Hematological:  Negative for adenopathy.  Psychiatric/Behavioral:  The patient is not nervous/anxious.        Objective     Today's Vitals   03/08/22 1607  BP: 107/69  Pulse: 70  Temp: 98.1 F (36.7 C)  SpO2: 99%  Weight: 206 lb 1.6 oz (93.5 kg)  Height: 5' 8.9" (1.75 m)   Body mass index is 30.53 kg/m.   BP Readings from Last 3 Encounters:  03/08/22 107/69  02/19/22 137/77  01/11/22 116/79    Wt Readings from Last 3 Encounters:  03/08/22 206 lb 1.6 oz (93.5 kg)  02/19/22 210 lb 8 oz (95.5 kg)  01/11/22 220 lb 11.2 oz (100.1 kg)    Physical Exam Vitals and nursing note reviewed.  Constitutional:      Appearance: Normal appearance. She is well-developed.  HENT:     Head: Normocephalic and atraumatic.     Nose: Nose normal.     Mouth/Throat:     Mouth: Mucous membranes are moist.     Pharynx: Oropharynx is clear.  Eyes:     Extraocular Movements: Extraocular movements intact.     Conjunctiva/sclera: Conjunctivae normal.     Pupils: Pupils are equal, round, and reactive to light.  Cardiovascular:     Rate and Rhythm: Normal rate and regular rhythm.     Pulses: Normal pulses.     Heart sounds: Normal heart sounds.  Pulmonary:     Effort: Pulmonary effort is normal.     Breath sounds: Normal breath sounds.   Abdominal:     Palpations: Abdomen is soft.  Musculoskeletal:        General: Normal range of motion.     Cervical back: Normal range of motion and neck supple.  Lymphadenopathy:     Cervical: No cervical adenopathy.  Skin:    General: Skin is warm and dry.     Capillary Refill: Capillary refill takes less than 2 seconds.  Neurological:     General: No focal deficit present.     Mental Status: She is alert and oriented to person, place, and time.  Psychiatric:        Mood and Affect: Mood normal.        Behavior: Behavior normal.        Thought Content: Thought content normal.        Judgment: Judgment normal.  Assessment & Plan    1. Chronic idiopathic constipation Stable. Patient now taking linzess 145 mcg daily.   2. BMI 30.0-30.9,adult Increase Wegovy to 1.7 mg weekly. Continue lower calorie intake and incorporating exercise. Advised patient to notify the office if constipation and abdominal pain worsened with increased dosing. Will go back to 1.0 mg weekly if this occurs. The patient is agreeable to this plan.  - Semaglutide-Weight Management 1.7 MG/0.75ML SOAJ; Inject 1.7 mg into the skin once a week.  Dispense: 3 mL; Refill: 1  3. Relapsing remitting multiple sclerosis (HCC) Stable. Continue regular visits and treatment through neurology    Problem List Items Addressed This Visit       Digestive   Chronic idiopathic constipation - Primary     Nervous and Auditory   Relapsing remitting multiple sclerosis ([place])     Other   BMI 30.0-30.9,adult   Relevant Medications   Semaglutide-Weight Management 1.7 MG/0.75ML SOAJ     Return in about 2 months (around 05/08/2022) for routine - weight management.         Ronnell Freshwater, NP  [facility] at [facility] [phone] (phone) [phone] (fax)  [place]

## 2022-03-16 ENCOUNTER — Other Ambulatory Visit (HOSPITAL_COMMUNITY): Payer: Self-pay

## 2022-03-21 ENCOUNTER — Other Ambulatory Visit (HOSPITAL_COMMUNITY): Payer: Self-pay

## 2022-03-31 ENCOUNTER — Telehealth: Payer: 59 | Admitting: Physician Assistant

## 2022-03-31 DIAGNOSIS — R0981 Nasal congestion: Secondary | ICD-10-CM | POA: Diagnosis not present

## 2022-03-31 MED ORDER — AMOXICILLIN-POT CLAVULANATE 875-125 MG PO TABS: 1.0000 | ORAL_TABLET | Freq: Two times a day (BID) | ORAL | 0 refills | Status: DC

## 2022-03-31 NOTE — Progress Notes (Signed)
Virtual Visit Consent   [place], you are scheduled for a virtual visit with a [facility] provider today. Just as with appointments in the office, your consent must be obtained to participate. Your consent will be active for this visit and any virtual visit you may have with one of our providers in the next 365 days. If you have a MyChart account, a copy of this consent can be sent to you electronically.  As this is a virtual visit, video technology does not allow for your provider to perform a traditional examination. This may limit your provider's ability to fully assess your condition. If your provider identifies any concerns that need to be evaluated in person or the need to arrange testing (such as labs, EKG, etc.), we will make arrangements to do so. Although advances in technology are sophisticated, we cannot ensure that it will always work on either your end or our end. If the connection with a video visit is poor, the visit may have to be switched to a telephone visit. With either a video or telephone visit, we are not always able to ensure that we have a secure connection.  By engaging in this virtual visit, you consent to the provision of healthcare and authorize for your insurance to be billed (if applicable) for the services provided during this visit. Depending on your insurance coverage, you may receive a charge related to this service.  I need to obtain your verbal consent now. Are you willing to proceed with your visit today? Ashley Reed has provided verbal consent on 03/31/2022 for a virtual visit (video or telephone). Ashley Reed, [place]  Date: 03/31/2022 12:43 PM  Virtual Visit via Video Note   I, Ashley Reed, connected with  Ashley Reed  ([id number], 08-16-2001) on 03/31/22 at 12:45 PM EDT by a video-enabled telemedicine application and verified that I am speaking with the correct person using two identifiers.  Location: Patient: Virtual Visit Location  Patient: Home Provider: Virtual Visit Location Provider: Home Office   I discussed the limitations of evaluation and management by telemedicine and the availability of in person appointments. The patient expressed understanding and agreed to proceed.    History of Present Illness: Ashley Reed is a 21 y.o. who identifies as a female who was assigned female at birth, and is being seen today for sinus infection symptoms.  Patient reports hx of sinus infections. Symptoms started a few days ago. Had a sick coworker who had sinus infection recently. She has nasal congestion, sinus pressure, cough. Denies: fevers, chills, n/v, concern for pregnancy.  She has tried several OTC sinus sx remedies -- none have provided relief.   HPI: HPI  Problems:  Patient Active Problem List   Diagnosis Date Noted   Chronic idiopathic constipation 01/20/2022   Noninfectious gastroenteritis 12/26/2021   LLQ cramping 12/26/2021   Acne vulgaris 10/13/2021   Neck pain 08/22/2021   Rash and nonspecific skin eruption 35/00/9381   Folliculitis 82/99/3716   Impaired fasting glucose 07/13/2021   Dysuria 07/13/2021   Encounter for general adult medical examination with abnormal findings 06/11/2021   Alopecia 06/11/2021   Fatigue 06/11/2021   Vitamin D deficiency 06/11/2021   BMI 30.0-30.9,adult 06/11/2021   Abnormal cervical Papanicolaou smear 05/03/2021   Encounter to establish care 05/03/2021   Multiple sclerosis ([place]) 05/03/2021   Relapsing remitting multiple sclerosis ([place]) 04/26/2021   High risk medication use 04/26/2021   Migraine without aura and without status migrainosus, not intractable 04/26/2021  Dysesthesia 04/26/2021   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   B12 deficiency 03/09/2021   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   Paraplegia, unspecified ([place]) 01/06/2021   Asthma 10/20/2019   Migraine 10/20/2019    Allergies: No Known Allergies Medications:  Current Outpatient Medications:     amoxicillin-clavulanate (AUGMENTIN) 875-125 MG tablet, Take 1 tablet by mouth 2 (two) times daily., Disp: 14 tablet, Rfl: 0   adapalene (DIFFERIN) 0.1 % cream, APPLY PEA SIZED AMOUNT TO FINGER AND SPREAD THIN LAYER TO ACNE AREAS ONCE A DAY IN MORNING, Disp: 45 g, Rfl: 1   albuterol (PROVENTIL HFA;VENTOLIN HFA) 108 (90 Base) MCG/ACT inhaler, Inhale 2 puffs 15 minutes prior to exercise and every 4 hours prn cough, wheeze with asthma flare, Disp: , Rfl:    B Complex Vitamins (VITAMIN B COMPLEX PO), Take by mouth., Disp: , Rfl:    cetirizine (ZYRTEC) 10 MG tablet, Take 10 mg by mouth daily., Disp: , Rfl:    Cyanocobalamin (VITAMIN B-12 PO), Take by mouth., Disp: , Rfl:    diclofenac sodium (VOLTAREN) 1 % GEL, Apply to large joint area up to three times a day as needed, Disp: 2 Tube, Rfl: 1   ergocalciferol (VITAMIN D2) 1.25 MG (50000 UT) capsule, Take 1 capsule (50,000 Units total) by mouth once a week., Disp: 12 capsule, Rfl: 1   etonogestrel-ethinyl estradiol (NUVARING) 0.12-0.015 MG/24HR vaginal ring, Insert 1 vaginal ring every month by vaginal route., Disp: 1 each, Rfl: 11   Fluticasone-Salmeterol (ADVAIR) 250-50 MCG/DOSE AEPB, INHALE 1 PUFF AS DIRECTED TWICE A DAY, Disp: 60 each, Rfl: 2   gabapentin (NEURONTIN) 300 MG capsule, Take 1 capsule (300 mg total) by mouth 3 (three) times daily., Disp: 90 capsule, Rfl: 11   linaclotide (LINZESS) 145 MCG CAPS capsule, Take 1 capsule (145 mcg total) by mouth daily before breakfast., Disp: 90 capsule, Rfl: 1   meloxicam (MOBIC) 15 MG tablet, Take 1 tablet (15 mg total) by mouth daily., Disp: 30 tablet, Rfl: 3   ondansetron (ZOFRAN-ODT) 4 MG disintegrating tablet, Dissolve 1-2 tablets (4-8 mg total) by mouth every 8 (eight) hours as needed for nausea or vomiting., Disp: 30 tablet, Rfl: 1   prednisoLONE acetate (PRED FORTE) 1 % ophthalmic suspension, Place 1 drop into the right eye 4 (four) times daily. (Patient taking differently: Place 1 drop into the right eye  4 (four) times daily as needed.), Disp: 10 mL, Rfl: 0   rizatriptan (MAXALT-MLT) 10 MG disintegrating tablet, Dissolve 1 tablet (10 mg total) by mouth as needed for migraine. May repeat in 2 hours if needed, Disp: 9 tablet, Rfl: 7   Semaglutide-Weight Management 1.7 MG/0.75ML SOAJ, Inject 1.7 mg into the skin once a week., Disp: 3 mL, Rfl: 1   silodosin (RAPAFLO) 4 MG CAPS capsule, Take 1 capsule (4 mg total) by mouth daily with breakfast., Disp: 60 capsule, Rfl: 5   tiZANidine (ZANAFLEX) 2 MG tablet, Take 1-2 tablets (2-4 mg total) by mouth at bedtime for spasticity, Disp: 60 tablet, Rfl: 5  Observations/Objective: Patient is well-developed, well-nourished in no acute distress.  Resting comfortably  at home.  Head is normocephalic, atraumatic.  No labored breathing.  Speech is clear and coherent with logical content.  Patient is alert and oriented at baseline.   Assessment and Plan: 1. Sinus congestion No red flags on discussion, patient is not in any obvious distress during our visit. Discussed progression of most viral illness, and recommended supportive care at this point in time. I did however provide  pocket rx for oral augmentin should symptoms not improve as anticipated. Discussed over the counter supportive care options, with recommendations to push fluids and rest. Reviewed return precautions including new/worsening fever, SOB, new/worsening cough or other concerns.  Recommended need to self-quarantine and practice social distancing until symptoms resolve. I recommend that patient follow-up if symptoms worsen or persist despite treatment x 7-10 days, sooner if needed.   Follow Up Instructions: I discussed the assessment and treatment plan with the patient. The patient was provided an opportunity to ask questions and all were answered. The patient agreed with the plan and demonstrated an understanding of the instructions.  A copy of instructions were sent to the patient via MyChart  unless otherwise noted below.    The patient was advised to call back or seek an in-person evaluation if the symptoms worsen or if the condition fails to improve as anticipated.  Time:  I spent 5-10 minutes with the patient via telehealth technology discussing the above problems/concerns.    Ashley Reed, [place]

## 2022-04-02 ENCOUNTER — Other Ambulatory Visit (HOSPITAL_COMMUNITY): Payer: Self-pay

## 2022-04-02 ENCOUNTER — Other Ambulatory Visit: Payer: Self-pay | Admitting: *Deleted

## 2022-04-02 ENCOUNTER — Other Ambulatory Visit: Payer: Self-pay | Admitting: Neurology

## 2022-04-02 ENCOUNTER — Encounter: Payer: Self-pay | Admitting: Neurology

## 2022-04-02 DIAGNOSIS — G43009 Migraine without aura, not intractable, without status migrainosus: Secondary | ICD-10-CM

## 2022-04-02 MED ORDER — EMGALITY 120 MG/ML ~~LOC~~ SOAJ
120.0000 mg | SUBCUTANEOUS | 11 refills | Status: DC
  Filled 2022-04-02 (×2): qty 1, 30d supply, fill #0
  Filled 2022-04-25: qty 1, 30d supply, fill #1
  Filled 2022-06-03: qty 1, 30d supply, fill #2
  Filled 2022-07-04: qty 1, 30d supply, fill #3
  Filled 2022-07-27: qty 1, 30d supply, fill #4
  Filled 2022-08-29: qty 1, 30d supply, fill #5
  Filled 2022-10-03: qty 1, 30d supply, fill #6
  Filled 2022-10-30: qty 1, 30d supply, fill #7
  Filled 2022-12-06: qty 1, 30d supply, fill #8
  Filled 2023-01-01: qty 1, 30d supply, fill #9
  Filled 2023-02-01: qty 1, 30d supply, fill #10

## 2022-04-02 MED ORDER — IMIPRAMINE HCL 25 MG PO TABS
25.0000 mg | ORAL_TABLET | Freq: Every day | ORAL | 5 refills | Status: DC
  Filled 2022-04-02: qty 60, 30d supply, fill #0

## 2022-04-02 NOTE — Telephone Encounter (Signed)
PA Emgality submitted on CMM. Key: [id number]. Waiting on determination from [place].

## 2022-04-02 NOTE — Telephone Encounter (Signed)
Received fax from [place] that [place] approved 04/02/22-04/02/23. PA ref# [id number].

## 2022-04-03 ENCOUNTER — Other Ambulatory Visit (HOSPITAL_COMMUNITY): Payer: Self-pay

## 2022-04-03 ENCOUNTER — Encounter: Payer: Self-pay | Admitting: Nurse Practitioner

## 2022-04-03 ENCOUNTER — Other Ambulatory Visit: Payer: Self-pay | Admitting: Neurology

## 2022-04-03 MED ORDER — TIZANIDINE HCL 2 MG PO TABS
2.0000 mg | ORAL_TABLET | Freq: Every day | ORAL | 11 refills | Status: AC
  Filled 2022-04-03: qty 60, 30d supply, fill #0
  Filled 2022-05-08: qty 60, 30d supply, fill #1
  Filled 2022-06-03: qty 60, 30d supply, fill #2
  Filled 2022-07-04: qty 60, 30d supply, fill #3
  Filled 2022-08-19: qty 60, 30d supply, fill #4
  Filled 2022-09-13 – 2022-09-14 (×2): qty 60, 30d supply, fill #5
  Filled 2022-09-23 – 2022-10-22 (×2): qty 60, 30d supply, fill #6
  Filled 2022-11-17: qty 60, 30d supply, fill #7
  Filled 2023-01-01: qty 60, 30d supply, fill #8

## 2022-04-05 ENCOUNTER — Encounter: Payer: Self-pay | Admitting: Nurse Practitioner

## 2022-04-05 ENCOUNTER — Other Ambulatory Visit (HOSPITAL_COMMUNITY): Payer: Self-pay

## 2022-04-06 ENCOUNTER — Other Ambulatory Visit: Payer: Self-pay | Admitting: Nurse Practitioner

## 2022-04-06 ENCOUNTER — Other Ambulatory Visit (HOSPITAL_COMMUNITY): Payer: Self-pay

## 2022-04-06 DIAGNOSIS — B379 Candidiasis, unspecified: Secondary | ICD-10-CM

## 2022-04-06 MED ORDER — FLUCONAZOLE 150 MG PO TABS
ORAL_TABLET | ORAL | 0 refills | Status: DC
  Filled 2022-04-06: qty 1, 1d supply, fill #0
  Filled 2022-04-06: qty 2, 2d supply, fill #0

## 2022-04-06 NOTE — Progress Notes (Signed)
Patient with yeast infection after completing course amoxicillin. Sent diflucan to [facility]

## 2022-04-12 DIAGNOSIS — G35 Multiple sclerosis: Secondary | ICD-10-CM | POA: Diagnosis not present

## 2022-04-24 ENCOUNTER — Other Ambulatory Visit (HOSPITAL_COMMUNITY): Payer: Self-pay

## 2022-04-25 ENCOUNTER — Other Ambulatory Visit: Payer: Self-pay | Admitting: Nurse Practitioner

## 2022-04-25 ENCOUNTER — Other Ambulatory Visit (HOSPITAL_COMMUNITY): Payer: Self-pay

## 2022-04-25 DIAGNOSIS — R110 Nausea: Secondary | ICD-10-CM

## 2022-04-25 MED ORDER — ONDANSETRON 4 MG PO TBDP
4.0000 mg | ORAL_TABLET | Freq: Three times a day (TID) | ORAL | 1 refills | Status: DC | PRN
  Filled 2022-04-25 (×2): qty 30, 5d supply, fill #0
  Filled 2022-07-27: qty 30, 5d supply, fill #1

## 2022-04-26 ENCOUNTER — Other Ambulatory Visit (HOSPITAL_COMMUNITY): Payer: Self-pay

## 2022-05-01 ENCOUNTER — Other Ambulatory Visit (HOSPITAL_COMMUNITY): Payer: Self-pay

## 2022-05-08 ENCOUNTER — Other Ambulatory Visit (HOSPITAL_COMMUNITY): Payer: Self-pay

## 2022-05-08 ENCOUNTER — Other Ambulatory Visit: Payer: Self-pay | Admitting: Neurology

## 2022-05-08 MED ORDER — GABAPENTIN 300 MG PO CAPS
300.0000 mg | ORAL_CAPSULE | Freq: Three times a day (TID) | ORAL | 0 refills | Status: DC
  Filled 2022-05-08 (×2): qty 90, 30d supply, fill #0

## 2022-05-10 ENCOUNTER — Other Ambulatory Visit (HOSPITAL_COMMUNITY): Payer: Self-pay

## 2022-05-10 ENCOUNTER — Encounter: Payer: Self-pay | Admitting: Nurse Practitioner

## 2022-05-10 ENCOUNTER — Ambulatory Visit: Payer: 59 | Admitting: Nurse Practitioner

## 2022-05-10 VITALS — BP 108/72 | HR 87 | Temp 97.7°F | Ht 68.0 in | Wt 201.0 lb

## 2022-05-10 DIAGNOSIS — G35 Multiple sclerosis: Secondary | ICD-10-CM | POA: Diagnosis not present

## 2022-05-10 DIAGNOSIS — Z6830 Body mass index (BMI) 30.0-30.9, adult: Secondary | ICD-10-CM

## 2022-05-10 MED ORDER — SEMAGLUTIDE-WEIGHT MANAGEMENT 1.7 MG/0.75ML ~~LOC~~ SOAJ
1.7000 mg | SUBCUTANEOUS | 2 refills | Status: DC
  Filled 2022-05-10: qty 3, 28d supply, fill #0
  Filled 2022-06-03: qty 3, 28d supply, fill #1
  Filled 2022-06-29: qty 3, 28d supply, fill #2

## 2022-05-10 NOTE — Progress Notes (Signed)
Established patient visit   Patient: Ashley Reed   DOB: 2001/08/02   21 y.o. Female  MRN: [id number] Visit Date: 05/10/2022   Chief Complaint  Patient presents with   Follow-up   Subjective    HPI  Follow up for weight management Initial weight 07/13/2021 - 241 Most recent weight 03/08/2022 - 72 Today's weight 05/10/2022 - 203 Weight loss since most recent visit here 5 pounds  Total weight loss since starting weight management program - 40 pounds   -chronic constipation  -multiple sclerosis    Medications: Outpatient Medications Prior to Visit  Medication Sig   adapalene (DIFFERIN) 0.1 % cream APPLY PEA SIZED AMOUNT TO FINGER AND SPREAD THIN LAYER TO ACNE AREAS ONCE A DAY IN MORNING   albuterol (PROVENTIL HFA;VENTOLIN HFA) 108 (90 Base) MCG/ACT inhaler Inhale 2 puffs 15 minutes prior to exercise and every 4 hours prn cough, wheeze with asthma flare   amoxicillin-clavulanate (AUGMENTIN) 875-125 MG tablet Take 1 tablet by mouth 2 (two) times daily.   B Complex Vitamins (VITAMIN B COMPLEX PO) Take by mouth.   cetirizine (ZYRTEC) 10 MG tablet Take 10 mg by mouth daily.   Cyanocobalamin (VITAMIN B-12 PO) Take by mouth.   diclofenac sodium (VOLTAREN) 1 % GEL Apply to large joint area up to three times a day as needed   ergocalciferol (VITAMIN D2) 1.25 MG (50000 UT) capsule Take 1 capsule (50,000 Units total) by mouth once a week.   etonogestrel-ethinyl estradiol (NUVARING) 0.12-0.015 MG/24HR vaginal ring Insert 1 vaginal ring every month by vaginal route.   fluconazole (DIFLUCAN) 150 MG tablet Take 1 tablet by mouth once, may repeat in 3 days if symptoms persist   gabapentin (NEURONTIN) 300 MG capsule Take 1 capsule (300 mg total) by mouth 3 (three) times daily.   Galcanezumab-gnlm (EMGALITY) 120 MG/ML SOAJ Inject 120 mg into the skin every 30 (thirty) days.   imipramine (TOFRANIL) 25 MG tablet Take 1-2 tablets (25-50 mg total) by mouth at bedtime.   linaclotide (LINZESS) 145 MCG  CAPS capsule Take 1 capsule (145 mcg total) by mouth daily before breakfast.   meloxicam (MOBIC) 15 MG tablet Take 1 tablet (15 mg total) by mouth daily.   ondansetron (ZOFRAN-ODT) 4 MG disintegrating tablet Dissolve 1-2 tablets (4-8 mg total) by mouth every 8 (eight) hours as needed for nausea or vomiting.   prednisoLONE acetate (PRED FORTE) 1 % ophthalmic suspension Place 1 drop into the right eye 4 (four) times daily. (Patient taking differently: Place 1 drop into the right eye 4 (four) times daily as needed.)   rizatriptan (MAXALT-MLT) 10 MG disintegrating tablet Dissolve 1 tablet (10 mg total) by mouth as needed for migraine. May repeat in 2 hours if needed   tiZANidine (ZANAFLEX) 2 MG tablet Take 1-2 tablets (2-4 mg total) by mouth at bedtime for spasticity   [DISCONTINUED] Semaglutide-Weight Management 1.7 MG/0.75ML SOAJ Inject 1.7 mg into the skin once a week.   Fluticasone-Salmeterol (ADVAIR) 250-50 MCG/DOSE AEPB INHALE 1 PUFF AS DIRECTED TWICE A DAY   [DISCONTINUED] silodosin (RAPAFLO) 4 MG CAPS capsule Take 1 capsule (4 mg total) by mouth daily with breakfast.   No facility-administered medications prior to visit.    Review of Systems  Constitutional:  Negative for activity change, appetite change, chills, fatigue and fever.       Five pound weight loss since ,ost recent visit   HENT:  Negative for congestion, postnasal drip, rhinorrhea, sinus pressure, sinus pain, sneezing and sore throat.  Eyes: Negative.   Respiratory:  Negative for cough, chest tightness, shortness of breath and wheezing.   Cardiovascular:  Negative for chest pain and palpitations.  Gastrointestinal:  Positive for constipation. Negative for abdominal pain, diarrhea, nausea and vomiting.  Endocrine: Negative for cold intolerance, heat intolerance, polydipsia and polyuria.  Genitourinary:  Negative for dyspareunia, dysuria, flank pain, frequency and urgency.  Musculoskeletal:  Negative for arthralgias, back pain  and myalgias.  Skin:  Negative for rash.  Allergic/Immunologic: Negative for environmental allergies.  Neurological:  Positive for headaches. Negative for dizziness and weakness.  Hematological:  Negative for adenopathy.  Psychiatric/Behavioral:  The patient is not nervous/anxious.        Objective     Today's Vitals   05/10/22 1600  BP: 108/72  Pulse: 87  Temp: 97.7 F (36.5 C)  SpO2: 99%  Weight: 201 lb (91.2 kg)  Height: '5\' 8"'$  (1.727 m)   Body mass index is 30.56 kg/m.   BP Readings from Last 3 Encounters:  05/10/22 108/72  03/08/22 107/69  02/19/22 137/77    Wt Readings from Last 3 Encounters:  05/10/22 201 lb (91.2 kg)  03/08/22 206 lb 1.6 oz (93.5 kg)  02/19/22 210 lb 8 oz (95.5 kg)    Physical Exam Vitals and nursing note reviewed.  Constitutional:      Appearance: Normal appearance. She is well-developed.  HENT:     Head: Normocephalic and atraumatic.     Nose: Nose normal.     Mouth/Throat:     Mouth: Mucous membranes are moist.     Pharynx: Oropharynx is clear.  Eyes:     Extraocular Movements: Extraocular movements intact.     Conjunctiva/sclera: Conjunctivae normal.     Pupils: Pupils are equal, round, and reactive to light.  Cardiovascular:     Rate and Rhythm: Normal rate and regular rhythm.     Pulses: Normal pulses.     Heart sounds: Normal heart sounds.  Pulmonary:     Effort: Pulmonary effort is normal.     Breath sounds: Normal breath sounds.  Abdominal:     Palpations: Abdomen is soft.  Musculoskeletal:        General: Normal range of motion.     Cervical back: Normal range of motion and neck supple.  Lymphadenopathy:     Cervical: No cervical adenopathy.  Skin:    General: Skin is warm and dry.     Capillary Refill: Capillary refill takes less than 2 seconds.  Neurological:     General: No focal deficit present.     Mental Status: She is alert and oriented to person, place, and time.  Psychiatric:        Mood and Affect: Mood  normal.        Behavior: Behavior normal.        Thought Content: Thought content normal.        Judgment: Judgment normal.      Assessment & Plan    1. Multiple sclerosis (HCC) Stable. Continue regular  visits  with neurology and treatments as scheduled   2. BMI 30.0-30.9,adult Continued improvement  with total weight loss so far of 40 pounds. Continue wegovy 1.7 mg weekly. Continue lowering calorie intake to 1500 calories per day and incorporating exercise into daily routine to help lose weight.  - Semaglutide-Weight Management 1.7 MG/0.75ML SOAJ; Inject 1.7 mg into the skin once a week.  Dispense: 3 mL; Refill: 2   Problem List Items Addressed This Visit  Nervous and Auditory   Multiple sclerosis ([place]) - Primary     Other   BMI 30.0-30.9,adult   Relevant Medications   Semaglutide-Weight Management 1.7 MG/0.75ML SOAJ     Return in about 2 months (around 07/10/2022) for health maintenance exam.         Ronnell Freshwater, NP  [place] at [facility] [phone] (phone) [phone] (fax)  [facility]

## 2022-05-15 ENCOUNTER — Encounter: Payer: Self-pay | Admitting: Neurology

## 2022-05-23 ENCOUNTER — Other Ambulatory Visit (HOSPITAL_COMMUNITY): Payer: Self-pay

## 2022-06-03 ENCOUNTER — Other Ambulatory Visit: Payer: Self-pay | Admitting: Neurology

## 2022-06-04 ENCOUNTER — Other Ambulatory Visit (HOSPITAL_COMMUNITY): Payer: Self-pay

## 2022-06-06 ENCOUNTER — Other Ambulatory Visit (HOSPITAL_COMMUNITY): Payer: Self-pay

## 2022-06-06 MED ORDER — NORETHIN ACE-ETH ESTRAD-FE 1-20 MG-MCG PO TABS
1.0000 | ORAL_TABLET | Freq: Every day | ORAL | 2 refills | Status: DC
  Filled 2022-06-06: qty 84, 84d supply, fill #0

## 2022-06-07 ENCOUNTER — Other Ambulatory Visit (HOSPITAL_COMMUNITY): Payer: Self-pay

## 2022-06-12 ENCOUNTER — Encounter: Payer: Self-pay | Admitting: Neurology

## 2022-06-18 ENCOUNTER — Encounter: Payer: Self-pay | Admitting: Nurse Practitioner

## 2022-06-18 ENCOUNTER — Other Ambulatory Visit: Payer: Self-pay

## 2022-06-19 NOTE — Progress Notes (Unsigned)
Established patient visit   Patient: Ashley Reed   DOB: Sep 18, 2000   21 y.o. Female  MRN: [id number] Visit Date: 06/20/2022   No chief complaint on file.  Subjective    HPI  Follow up -head and ear pain    Medications: Outpatient Medications Prior to Visit  Medication Sig   adapalene (DIFFERIN) 0.1 % cream APPLY PEA SIZED AMOUNT TO FINGER AND SPREAD THIN LAYER TO ACNE AREAS ONCE A DAY IN MORNING   albuterol (PROVENTIL HFA;VENTOLIN HFA) 108 (90 Base) MCG/ACT inhaler Inhale 2 puffs 15 minutes prior to exercise and every 4 hours prn cough, wheeze with asthma flare   amoxicillin-clavulanate (AUGMENTIN) 875-125 MG tablet Take 1 tablet by mouth 2 (two) times daily.   B Complex Vitamins (VITAMIN B COMPLEX PO) Take by mouth.   cetirizine (ZYRTEC) 10 MG tablet Take 10 mg by mouth daily.   Cyanocobalamin (VITAMIN B-12 PO) Take by mouth.   diclofenac sodium (VOLTAREN) 1 % GEL Apply to large joint area up to three times a day as needed   ergocalciferol (VITAMIN D2) 1.25 MG (50000 UT) capsule Take 1 capsule (50,000 Units total) by mouth once a week.   fluconazole (DIFLUCAN) 150 MG tablet Take 1 tablet by mouth once, may repeat in 3 days if symptoms persist   Fluticasone-Salmeterol (ADVAIR) 250-50 MCG/DOSE AEPB INHALE 1 PUFF AS DIRECTED TWICE A DAY   gabapentin (NEURONTIN) 300 MG capsule Take 1 capsule (300 mg total) by mouth 3 (three) times daily.   Galcanezumab-gnlm (EMGALITY) 120 MG/ML SOAJ Inject 120 mg into the skin every 30 (thirty) days.   imipramine (TOFRANIL) 25 MG tablet Take 1-2 tablets (25-50 mg total) by mouth at bedtime.   linaclotide (LINZESS) 145 MCG CAPS capsule Take 1 capsule (145 mcg total) by mouth daily before breakfast.   meloxicam (MOBIC) 15 MG tablet Take 1 tablet (15 mg total) by mouth daily.   norethindrone-ethinyl estradiol-FE (BLISOVI FE 1/20) 1-20 MG-MCG tablet Take 1 tablet by mouth daily.   ondansetron (ZOFRAN-ODT) 4 MG disintegrating tablet Dissolve 1-2 tablets  (4-8 mg total) by mouth every 8 (eight) hours as needed for nausea or vomiting.   prednisoLONE acetate (PRED FORTE) 1 % ophthalmic suspension Place 1 drop into the right eye 4 (four) times daily. (Patient taking differently: Place 1 drop into the right eye 4 (four) times daily as needed.)   rizatriptan (MAXALT-MLT) 10 MG disintegrating tablet Dissolve 1 tablet (10 mg total) by mouth as needed for migraine. May repeat in 2 hours if needed   Semaglutide-Weight Management 1.7 MG/0.75ML SOAJ Inject 1.7 mg into the skin once a week.   tiZANidine (ZANAFLEX) 2 MG tablet Take 1-2 tablets (2-4 mg total) by mouth at bedtime for spasticity   No facility-administered medications prior to visit.    Review of Systems  {Labs (Optional):23779}   Objective    There were no vitals taken for this visit. BP Readings from Last 3 Encounters:  05/10/22 108/72  03/08/22 107/69  02/19/22 137/77    Wt Readings from Last 3 Encounters:  05/10/22 201 lb (91.2 kg)  03/08/22 206 lb 1.6 oz (93.5 kg)  02/19/22 210 lb 8 oz (95.5 kg)    Physical Exam  ***  No results found for any visits on 06/20/22.  Assessment & Plan     Problem List Items Addressed This Visit   None    No follow-ups on file.         Ronnell Freshwater, NP  [place]  [facility] [phone] (phone) [phone] (fax)  [place]

## 2022-06-20 ENCOUNTER — Other Ambulatory Visit (HOSPITAL_COMMUNITY): Payer: Self-pay

## 2022-06-20 ENCOUNTER — Encounter: Payer: Self-pay | Admitting: Nurse Practitioner

## 2022-06-20 ENCOUNTER — Ambulatory Visit: Payer: 59 | Admitting: Nurse Practitioner

## 2022-06-20 VITALS — BP 102/64 | HR 79 | Ht 68.0 in | Wt 200.8 lb

## 2022-06-20 DIAGNOSIS — H6690 Otitis media, unspecified, unspecified ear: Secondary | ICD-10-CM | POA: Diagnosis not present

## 2022-06-20 MED ORDER — OFLOXACIN 0.3 % OT SOLN
5.0000 [drp] | Freq: Every day | OTIC | 0 refills | Status: DC
  Filled 2022-06-20: qty 5, 20d supply, fill #0

## 2022-06-20 MED ORDER — AMOXICILLIN 875 MG PO TABS
875.0000 mg | ORAL_TABLET | Freq: Two times a day (BID) | ORAL | 0 refills | Status: DC
  Filled 2022-06-20: qty 14, 7d supply, fill #0

## 2022-06-22 ENCOUNTER — Ambulatory Visit
Admission: EM | Admit: 2022-06-22 | Discharge: 2022-06-22 | Disposition: A | Discharge status: Home / Self Care | Payer: 59 | Attending: Internal Medicine | Admitting: Internal Medicine

## 2022-06-22 ENCOUNTER — Other Ambulatory Visit (HOSPITAL_COMMUNITY): Payer: Self-pay

## 2022-06-22 ENCOUNTER — Encounter: Payer: Self-pay | Admitting: Emergency Medicine

## 2022-06-22 DIAGNOSIS — A084 Viral intestinal infection, unspecified: Secondary | ICD-10-CM | POA: Diagnosis not present

## 2022-06-22 DIAGNOSIS — R112 Nausea with vomiting, unspecified: Secondary | ICD-10-CM

## 2022-06-22 LAB — POCT INFLUENZA A/B
Influenza A, POC: NEGATIVE
Influenza B, POC: NEGATIVE

## 2022-06-22 NOTE — ED Provider Notes (Signed)
[facility]    CSN: [id number] Arrival date & time: 06/22/22  1006      History   Chief Complaint Chief Complaint  Patient presents with   Chills    Flu test - Entered by patient   Abdominal Pain   Diarrhea   Emesis    HPI Ashley Reed is a 21 y.o. female.   Patient presents with nausea, vomiting, diarrhea, abdominal pain, chills that started about 2 to 3 days ago.  Patient reports that her parent has had similar symptoms recently.  Denies any documented fevers at home.  Denies nasal congestion, runny nose, cough, pain, shortness of breath.  Patient denies blood in stool or emesis. Abdominal pain is a generalized and described as a mild cramping sensation.  Patient has taken previously prescribed Zofran about 2 hours prior to arrival to urgent care that was 8 mg.  Patient is requesting flu test as she has MS and wants to be sure that it is not flu.   Abdominal Pain Diarrhea Emesis   Past Medical History:  Diagnosis Date   Asthma    MS (multiple sclerosis) ([employer]) 01/07/2021    Patient Active Problem List   Diagnosis Date Noted   Acute otitis media 06/20/2022   Chronic idiopathic constipation 01/20/2022   Noninfectious gastroenteritis 12/26/2021   LLQ cramping 12/26/2021   Acne vulgaris 10/13/2021   Neck pain 08/22/2021   Rash and nonspecific skin eruption 93/81/0175   Folliculitis 07/11/8526   Impaired fasting glucose 07/13/2021   Dysuria 07/13/2021   Encounter for general adult medical examination with abnormal findings 06/11/2021   Alopecia 06/11/2021   Fatigue 06/11/2021   Vitamin D deficiency 06/11/2021   BMI 30.0-30.9,adult 06/11/2021   Abnormal cervical Papanicolaou smear 05/03/2021   Encounter to establish care 05/03/2021   Multiple sclerosis ([place]) 05/03/2021   Relapsing remitting multiple sclerosis ([place]) 04/26/2021   High risk medication use 04/26/2021   Migraine without aura and without status migrainosus, not intractable 04/26/2021    Dysesthesia 04/26/2021   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   B12 deficiency 03/09/2021   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      Home Medications    Prior to Admission medications   Medication Sig Start Date End Date Taking? Authorizing Provider  adapalene (DIFFERIN) 0.1 % cream APPLY PEA SIZED AMOUNT TO FINGER AND SPREAD THIN LAYER TO ACNE AREAS ONCE A DAY IN MORNING 10/13/21   Ronnell Freshwater, NP  albuterol (PROVENTIL HFA;VENTOLIN HFA) 108 (90 Base) MCG/ACT inhaler Inhale 2 puffs 15 minutes prior to exercise and every 4 hours prn cough, wheeze with asthma flare 04/23/16   [provider]  amoxicillin (AMOXIL) 875 MG tablet Take 1 tablet (875 mg total) by mouth 2 (two) times daily. 06/20/22   Ronnell Freshwater, NP  B Complex Vitamins (VITAMIN B COMPLEX PO) Take by mouth.    [provider]  cetirizine (ZYRTEC) 10 MG tablet Take 10 mg by mouth daily.    [provider]  Cyanocobalamin (VITAMIN B-12 PO) Take by mouth.    [provider]  diclofenac sodium (VOLTAREN) 1 % GEL Apply to large joint area up to three times a day as needed 02/20/18  Garald Balding, MD  ergocalciferol (VITAMIN D2) 1.25 MG (50000 UT) capsule Take 1 capsule (50,000 Units total) by mouth once a week. 11/20/21   Ronnell Freshwater, NP  fluconazole (DIFLUCAN) 150 MG tablet Take 1 tablet by mouth once, may repeat in 3 days if symptoms persist 04/06/22   Ronnell Freshwater, NP  Fluticasone-Salmeterol (ADVAIR) 250-50 MCG/DOSE AEPB INHALE 1 PUFF AS DIRECTED TWICE A DAY 11/18/20 11/18/21  Carolann Littler, MD  gabapentin (NEURONTIN) 300 MG capsule Take 1 capsule (300 mg total) by mouth 3 (three) times daily. 05/08/22   Sater, Nanine Means, MD   Galcanezumab-gnlm (EMGALITY) 120 MG/ML SOAJ Inject 120 mg into the skin every 30 (thirty) days. 04/02/22   Sater, Nanine Means, MD  linaclotide Rolan Lipa) 145 MCG CAPS capsule Take 1 capsule (145 mcg total) by mouth daily before breakfast. 02/07/22   Ronnell Freshwater, NP  meloxicam (MOBIC) 15 MG tablet Take 1 tablet (15 mg total) by mouth daily. 11/09/20     norethindrone-ethinyl estradiol-FE (BLISOVI FE 1/20) 1-20 MG-MCG tablet Take 1 tablet by mouth daily. 06/06/22     ofloxacin (FLOXIN OTIC) 0.3 % OTIC solution Place 5 drops into both ears daily. 06/20/22   Ronnell Freshwater, NP  ondansetron (ZOFRAN-ODT) 4 MG disintegrating tablet Dissolve 1-2 tablets (4-8 mg total) by mouth every 8 (eight) hours as needed for nausea or vomiting. 04/25/22   Ronnell Freshwater, NP  prednisoLONE acetate (PRED FORTE) 1 % ophthalmic suspension Place 1 drop into the right eye 4 (four) times daily. Patient taking differently: Place 1 drop into the right eye 4 (four) times daily as needed. 04/15/20     rizatriptan (MAXALT-MLT) 10 MG disintegrating tablet Dissolve 1 tablet (10 mg total) by mouth as needed for migraine. May repeat in 2 hours if needed 12/20/21   Sater, Nanine Means, MD  Semaglutide-Weight Management 1.7 MG/0.75ML SOAJ Inject 1.7 mg into the skin once a week. 05/10/22   Ronnell Freshwater, NP  tiZANidine (ZANAFLEX) 2 MG tablet Take 1-2 tablets (2-4 mg total) by mouth at bedtime for spasticity 04/03/22   Sater, Nanine Means, MD  etonogestrel-ethinyl estradiol (NUVARING) 0.12-0.015 MG/24HR vaginal ring Insert 1 vaginal ring every month by vaginal route. 12/05/21 06/06/22      Family History Family History  Problem Relation Age of Onset   Fibromyalgia Mother    Bipolar disorder Father    Asthma Brother    Asthma Brother    Asthma Brother    Mental illness Brother     Social History Social History   Tobacco Use   Smoking status: Never   Smokeless tobacco: Never  Vaping Use   Vaping Use: Never used  Substance Use  Topics   Alcohol use: Yes    Comment: on occasion   Drug use: Not Currently     Allergies   Patient has no known allergies.   Review of Systems Review of Systems Per HPI  Physical Exam Triage Vital Signs ED Triage Vitals  Enc Vitals Group     BP 06/22/22 1023 128/86     Pulse Rate 06/22/22 1023 92     Resp 06/22/22 1023 17     Temp 06/22/22 1023 97.7 F (36.5 C)     Temp src --      SpO2 06/22/22 1023 99 %     Weight --      Height --      Head Circumference --      Peak Flow --  Pain Score 06/22/22 1022 8     Pain Loc --      Pain Edu? --      Excl. in [place]? --    No data found.  Updated Vital Signs BP 128/86   Pulse 92   Temp 97.7 F (36.5 C)   Resp 17   SpO2 99%   Visual Acuity Right Eye Distance:   Left Eye Distance:   Bilateral Distance:    Right Eye Near:   Left Eye Near:    Bilateral Near:     Physical Exam Constitutional:      General: She is not in acute distress.    Appearance: Normal appearance. She is not toxic-appearing or diaphoretic.  HENT:     Head: Normocephalic and atraumatic.     Mouth/Throat:     Mouth: Mucous membranes are moist.     Pharynx: No posterior oropharyngeal erythema.  Eyes:     Extraocular Movements: Extraocular movements intact.     Conjunctiva/sclera: Conjunctivae normal.  Cardiovascular:     Rate and Rhythm: Normal rate and regular rhythm.     Pulses: Normal pulses.     Heart sounds: Normal heart sounds.  Pulmonary:     Effort: Pulmonary effort is normal. No respiratory distress.  Abdominal:     General: Bowel sounds are normal. There is no distension.     Palpations: Abdomen is soft.     Tenderness: There is no abdominal tenderness.  Neurological:     General: No focal deficit present.     Mental Status: She is alert and oriented to person, place, and time. Mental status is at baseline.  Psychiatric:        Mood and Affect: Mood normal.        Behavior: Behavior normal.        Thought Content:  Thought content normal.        Judgment: Judgment normal.      UC Treatments / Results  Labs (all labs ordered are listed, but only abnormal results are displayed) Labs Reviewed  POCT INFLUENZA A/B    EKG   Radiology No results found.  Procedures Procedures (including critical care time)  Medications Ordered in UC Medications - No data to display  Initial Impression / Assessment and Plan / UC Course  I have reviewed the triage vital signs and the nursing notes.  Pertinent labs & imaging results that were available during my care of the patient were reviewed by me and considered in my medical decision making (see chart for details).     Patient's physical exam and history of symptoms appear viral in nature.  No obvious indication of acute abdomen or need for imaging on exam.  No signs of dehydration on exam.  Patient was advised to encourage clear oral fluid intake to prevent dehydration.  Advised bland diet as well.  Patient already has prescription for ondansetron and encouraged patient to continue this as needed.  Rapid flu was negative which was completed per patient request.  Suggested COVID testing but patient declined.  Patient advised to go to the emergency department if symptoms persist or worsen.  Patient verbalized understanding and was agreeable with plan. Final Clinical Impressions(s) / UC Diagnoses   Final diagnoses:  Viral gastroenteritis  Nausea vomiting and diarrhea     Discharge Instructions      It appears that you have a viral stomach virus that should run its course and self resolve with symptomatic treatment.  Recommend bland  diet as we discussed as well as increasing clear oral fluid intake that includes Gatorade, Pedialyte, or Gatorade.  Please go to the hospital if symptoms persist or worsen.  Flu test was negative.    ED Prescriptions   None    PDMP not reviewed this encounter.   Teodora Medici, [place] 06/22/22 1118

## 2022-06-22 NOTE — ED Triage Notes (Signed)
Pt is present today with with c/o diarrhea, vomiting, abdominal pain, and chills. Pt sx started Wednesday

## 2022-06-22 NOTE — Discharge Instructions (Signed)
It appears that you have a viral stomach virus that should run its course and self resolve with symptomatic treatment.  Recommend bland diet as we discussed as well as increasing clear oral fluid intake that includes Gatorade, Pedialyte, or Gatorade.  Please go to the hospital if symptoms persist or worsen.  Flu test was negative.

## 2022-06-29 ENCOUNTER — Other Ambulatory Visit: Payer: Self-pay | Admitting: Neurology

## 2022-06-29 ENCOUNTER — Other Ambulatory Visit (HOSPITAL_COMMUNITY): Payer: Self-pay

## 2022-06-29 ENCOUNTER — Other Ambulatory Visit: Payer: Self-pay | Admitting: Nurse Practitioner

## 2022-06-29 DIAGNOSIS — E559 Vitamin D deficiency, unspecified: Secondary | ICD-10-CM

## 2022-07-02 ENCOUNTER — Other Ambulatory Visit (HOSPITAL_COMMUNITY): Payer: Self-pay

## 2022-07-02 MED ORDER — GABAPENTIN 300 MG PO CAPS
300.0000 mg | ORAL_CAPSULE | Freq: Three times a day (TID) | ORAL | 0 refills | Status: DC
  Filled 2022-07-02: qty 90, 30d supply, fill #0

## 2022-07-02 MED ORDER — ERGOCALCIFEROL 1.25 MG (50000 UT) PO CAPS
1.0000 | ORAL_CAPSULE | ORAL | 1 refills | Status: DC
  Filled 2022-07-02: qty 12, 84d supply, fill #0
  Filled 2022-09-13 – 2022-09-14 (×2): qty 12, 84d supply, fill #1

## 2022-07-03 ENCOUNTER — Encounter: Payer: Self-pay | Admitting: Neurology

## 2022-07-04 ENCOUNTER — Other Ambulatory Visit (HOSPITAL_COMMUNITY): Payer: Self-pay

## 2022-07-09 ENCOUNTER — Other Ambulatory Visit (HOSPITAL_COMMUNITY): Payer: Self-pay

## 2022-07-09 ENCOUNTER — Encounter: Payer: Self-pay | Admitting: Neurology

## 2022-07-09 ENCOUNTER — Other Ambulatory Visit: Payer: 59

## 2022-07-09 ENCOUNTER — Ambulatory Visit (INDEPENDENT_AMBULATORY_CARE_PROVIDER_SITE_OTHER): Payer: 59 | Admitting: Neurology

## 2022-07-09 VITALS — BP 117/86 | HR 81 | Ht 68.0 in | Wt 197.6 lb

## 2022-07-09 DIAGNOSIS — M542 Cervicalgia: Secondary | ICD-10-CM | POA: Diagnosis not present

## 2022-07-09 DIAGNOSIS — G43009 Migraine without aura, not intractable, without status migrainosus: Secondary | ICD-10-CM

## 2022-07-09 DIAGNOSIS — F988 Other specified behavioral and emotional disorders with onset usually occurring in childhood and adolescence: Secondary | ICD-10-CM

## 2022-07-09 DIAGNOSIS — G4489 Other headache syndrome: Secondary | ICD-10-CM | POA: Diagnosis not present

## 2022-07-09 DIAGNOSIS — R5383 Other fatigue: Secondary | ICD-10-CM | POA: Diagnosis not present

## 2022-07-09 DIAGNOSIS — R208 Other disturbances of skin sensation: Secondary | ICD-10-CM

## 2022-07-09 DIAGNOSIS — G35 Multiple sclerosis: Secondary | ICD-10-CM

## 2022-07-09 MED ORDER — METHYLPHENIDATE HCL ER (OSM) 27 MG PO TBCR
27.0000 mg | EXTENDED_RELEASE_TABLET | ORAL | 0 refills | Status: DC
  Filled 2022-07-09: qty 30, 30d supply, fill #0

## 2022-07-09 NOTE — Progress Notes (Addendum)
[facility]  PATIENT: Ashley Reed DOB: [birth year]  REFERRING DOCTOR OR PCP:  Nathaneil Canary, MD SOURCE: Patient, notes from [facility], imaging and lab results, MRI images personally reviewed  _________________________________   HISTORICAL  CHIEF COMPLAINT:  Chief Complaint  Patient presents with   Follow-up    RM 2, alone. Last seen 02/19/22.  Here for injection.    Ashley, Reed a 21 y.o. woman with multiple sclerosis and headaches.  Update 10/232023: At the last visit, she was experiencing daily severe headaches, predominantly in the occiput radiating forward.  Splenius capitis trigger point injections/occipital nerve blocks greatly helped her pain until a few weeks ago.  More recently, pain has returned and is similar to what she was experiencing back in January.   Pain is bilateral.   She had stopped gabapentin recently.   She just takes meloxican prn.   She does continue tizanidine 4 mg nightly.   She continues on [employer].    Her MS is stable.  Her first Ocrevus infusion were July 2022 and next one will be April 12, 2022.    She had some GI issues with the last infusion   Sometimes she gets a tight sensation in her throat but Benadryl seems to help.       Currently gait is doing about the same and no falls.    The legs are symmetric.   Her legs tingle if she walks long distances.   She uses the rails on stairs.      She sometimes drops items out of hands.   She has more urinary dysfunction.   She has hesitancy and trouble with incomplete emptying.   Flomax was not tolerated and did not help her much during the couple days that she took it.        Cognition is the same with occasional memory issues.  .   Modafinil helped fatigue some but she stopped due to interaction with OCP.   She sleeps well at night.  She denies mood issues.    She has had daily more severe headaches over the last month.  The worst pain is at the  occiput and radiates forward.  She has had lower back pain x many years.   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   She has had LBP and occasional leg pain.   A few times she has had her back lock up due to muscle spasms.    This improved with muscle relaxant.    MS HISTORY: She is a 21 year old woman who had numbness from the waist down lasting 1 month in January and February.    She did not have any balance issues.   She also had mild urinary retention.   In April, she had the onset of numbness form the breast down and also had poor balance.   With symptoms persisting she saw the ED at [facility] but MRI was not up.   She returned to school at [employer] and had an MRI performed 01/06/2021.  This was about 3 weeks after the onset of symptoms.   She was admitted to [employer] and did 7 days of IV Solu-medrol.  Symptoms improved.    She saw Dr. Concha Pyo.   She was started on Ocrevus.  Her last infusion was March 30, 2021.  She noticed issues with stomach pain, nausea lasting a couple weeks from the infusion.  Additionally she had  some hair loss since the last infusion.   Imaging personally reviewed (and shown to patient): MRI of the brain 01/06/2021 shows multiple T2/FLAIR hyperintense foci, predominantly in the periventricular white matter including the callosal septal fibers.  1 focus is also in the frontal juxtacortical white matter on the left.  None of the foci enhance.    Cervical, thoracic and lumbar spine MRI 01/06/2021 showed a large T2 hyperintense field is adjacent to T6.  It showed faint enhancement degenerative changes are noted at L3-L4, L4-L5 and L5-S1.[name]  There is spinal stenosis at L3-L4 .      Pertinent laboratory tests: 01/07/2021: CSF showed 3 oligoclonal bands.  Myelin basic protein was elevated. 02/10/2021: Hepatitis panel was negative.  Vitamin D was mildly low.  TSH was normal REVIEW OF SYSTEMS: Constitutional: No fevers, chills, sweats, or change in  appetite Eyes: No visual changes, double vision, eye pain Ear, nose and throat: No hearing loss, ear pain, nasal congestion, sore throat Cardiovascular: No chest pain, palpitations Respiratory:  No shortness of breath at rest or with exertion.   No wheezes GastrointestinaI: No nausea, vomiting, diarrhea, abdominal pain, fecal incontinence Genitourinary:  No dysuria, urinary retention or frequency.  No nocturia. Musculoskeletal:  No neck pain, back pain Integumentary: No rash, pruritus, skin lesions Neurological: as above Psychiatric: No depression at this time.  No anxiety Endocrine: No palpitations, diaphoresis, change in appetite, change in weigh or increased thirst Hematologic/Lymphatic:  No anemia, purpura, petechiae. Allergic/Immunologic: No itchy/runny eyes, nasal congestion, recent allergic reactions, rashes  ALLERGIES: No Known Allergies  HOME MEDICATIONS:  Current Outpatient Medications:    adapalene (DIFFERIN) 0.1 % cream, APPLY PEA SIZED AMOUNT TO FINGER AND SPREAD THIN LAYER TO ACNE AREAS ONCE A DAY IN MORNING, Disp: 45 g, Rfl: 1   albuterol (PROVENTIL HFA;VENTOLIN HFA) 108 (90 Base) MCG/ACT inhaler, Inhale 2 puffs 15 minutes prior to exercise and every 4 hours prn cough, wheeze with asthma flare, Disp: , Rfl:    amoxicillin (AMOXIL) 875 MG tablet, Take 1 tablet (875 mg total) by mouth 2 (two) times daily., Disp: 14 tablet, Rfl: 0   B Complex Vitamins (VITAMIN B COMPLEX PO), Take by mouth., Disp: , Rfl:    cetirizine (ZYRTEC) 10 MG tablet, Take 10 mg by mouth daily., Disp: , Rfl:    Cyanocobalamin (VITAMIN B-12 PO), Take by mouth., Disp: , Rfl:    diclofenac sodium (VOLTAREN) 1 % GEL, Apply to large joint area up to three times a day as needed, Disp: 2 Tube, Rfl: 1   ergocalciferol (VITAMIN D2) 1.25 MG (50000 UT) capsule, Take 1 capsule (50,000 Units total) by mouth once a week., Disp: 12 capsule, Rfl: 1   fluconazole (DIFLUCAN) 150 MG tablet, Take 1 tablet by mouth once, may  repeat in 3 days if symptoms persist, Disp: 3 tablet, Rfl: 0   gabapentin (NEURONTIN) 300 MG capsule, Take 1 capsule (300 mg total) by mouth 3 (three) times daily., Disp: 90 capsule, Rfl: 0   Galcanezumab-gnlm (EMGALITY) 120 MG/ML SOAJ, Inject 120 mg into the skin every 30 (thirty) days., Disp: 1 mL, Rfl: 11   linaclotide (LINZESS) 145 MCG CAPS capsule, Take 1 capsule (145 mcg total) by mouth daily before breakfast., Disp: 90 capsule, Rfl: 1   meloxicam (MOBIC) 15 MG tablet, Take 1 tablet (15 mg total) by mouth daily., Disp: 30 tablet, Rfl: 3   methylphenidate (CONCERTA) 27 MG PO CR tablet, Take 1 tablet (27 mg total) by mouth every morning., Disp: 30 tablet,  Rfl: 0   norethindrone-ethinyl estradiol-FE (BLISOVI FE 1/20) 1-20 MG-MCG tablet, Take 1 tablet by mouth daily., Disp: 84 tablet, Rfl: 2   ondansetron (ZOFRAN-ODT) 4 MG disintegrating tablet, Dissolve 1-2 tablets (4-8 mg total) by mouth every 8 (eight) hours as needed for nausea or vomiting., Disp: 30 tablet, Rfl: 1   prednisoLONE acetate (PRED FORTE) 1 % ophthalmic suspension, Place 1 drop into the right eye 4 (four) times daily. (Patient taking differently: Place 1 drop into the right eye 4 (four) times daily as needed.), Disp: 10 mL, Rfl: 0   rizatriptan (MAXALT-MLT) 10 MG disintegrating tablet, Dissolve 1 tablet (10 mg total) by mouth as needed for migraine. May repeat in 2 hours if needed, Disp: 9 tablet, Rfl: 7   Semaglutide-Weight Management 1.7 MG/0.75ML SOAJ, Inject 1.7 mg into the skin once a week., Disp: 3 mL, Rfl: 2   tiZANidine (ZANAFLEX) 2 MG tablet, Take 1-2 tablets (2-4 mg total) by mouth at bedtime for spasticity, Disp: 60 tablet, Rfl: 11   Fluticasone-Salmeterol (ADVAIR) 250-50 MCG/DOSE AEPB, INHALE 1 PUFF AS DIRECTED TWICE A DAY, Disp: 60 each, Rfl: 2  PAST MEDICAL HISTORY: Past Medical History:  Diagnosis Date   Asthma    MS (multiple sclerosis) ([place]) 01/07/2021    PAST SURGICAL HISTORY: Past Surgical History:   Procedure Laterality Date   BREAST REDUCTION SURGERY     WISDOM TOOTH EXTRACTION      FAMILY HISTORY: Family History  Problem Relation Age of Onset   Fibromyalgia Mother    Bipolar disorder Father    Asthma Brother    Asthma Brother    Asthma Brother    Mental illness Brother     SOCIAL HISTORY:  Social History   Socioeconomic History   Marital status: Single    Spouse name: Not on file   Number of children: Not on file   Years of education: Not on file   Highest education level: Bachelor's degree (e.g., BA, AB, BS)  Occupational History   Not on file  Tobacco Use   Smoking status: Never   Smokeless tobacco: Never  Vaping Use   Vaping Use: Never used  Substance and Sexual Activity   Alcohol use: Yes    Comment: on occasion   Drug use: Not Currently   Sexual activity: Not on file  Other Topics Concern   Not on file  Social History Narrative   Lives with mother   Right handed   Caffeine: 1-2 coffee in a week   Social Determinants of Health   Financial Resource Strain: Not on file  Food Insecurity: Not on file  Transportation Needs: Not on file  Physical Activity: Not on file  Stress: Not on file  Social Connections: Not on file  Intimate Partner Violence: Not on file     PHYSICAL EXAM  Vitals:   07/09/22 1436  BP: 117/86  Pulse: 81  Weight: 197 lb 9.6 oz (89.6 kg)  Height: '5\' 8"'$  (1.727 m)    Body mass index is 30.04 kg/m.  No results found.   General: The patient is well-developed and well-nourished and in no acute distress  HEENT:  Head is [place]/AT.  Sclera are anicteric.     Skin: Extremities are without rash or  edema.  Musculoskeletal: There is tenderness over the occiput/splenius capitis muscles and in the mid cervical paraspinal muscles.  Range of motion was normal.  Neurologic Exam  Mental status: The patient is alert and oriented x 3 at the time of  the examination. The patient has apparent normal recent and remote memory, with an  apparently normal attention span and concentration ability.   Speech is normal.  Cranial nerves: Extraocular movements are full.  Facial strength and sesation were noral No obvious hearing deficits are noted.  Motor:  Muscle bulk is normal.   Tone is normal. Strength is  5 / 5 in all 4 extremities.   Sensory: Sensory testing is intact to vibration sensation in all 4 extremities.  She has allodynia to cold temperature and altered sensation to touch below +/- T7 level.  Coordination: Cerebellar testing reveals good finger-nose-finger and heel-to-shin bilaterally.  Gait and station: Station is normal.   The gait and tandem gait are fairly normal.   Romberg is negative.   Reflexes: Deep tendon reflexes are symmetric and normal bilaterally.       DIAGNOSTIC DATA (LABS, IMAGING, TESTING) - I reviewed patient records, labs, notes, testing and imaging myself where available.  Lab Results  Component Value Date   WBC 6.4 02/20/2022   HGB 14.8 02/20/2022   HCT 42.2 02/20/2022   MCV 90 02/20/2022   PLT 315 02/20/2022      Component Value Date/Time   NA 138 05/31/2021 0823   K 4.4 05/31/2021 0823   CL 100 05/31/2021 0823   CO2 22 05/31/2021 0823   GLUCOSE 74 05/31/2021 0823   GLUCOSE 88 01/01/2021 1025   BUN 12 05/31/2021 0823   CREATININE 0.85 05/31/2021 0823   CALCIUM 8.9 05/31/2021 0823   PROT 7.3 02/20/2022 0840   ALBUMIN 4.8 02/20/2022 0840   AST 28 02/20/2022 0840   ALT 15 02/20/2022 0840   ALKPHOS 68 02/20/2022 0840   BILITOT 0.5 02/20/2022 0840   GFRNONAA >60 01/01/2021 1025   No results found for: "CHOL", "HDL", "LDLCALC", "LDLDIRECT", "TRIG", "CHOLHDL" No results found for: "HGBA1C" Lab Results  Component Value Date   GQQPYPPJ09 326 05/31/2021   Lab Results  Component Value Date   TSH 2.440 05/31/2021       ASSESSMENT AND PLAN  Multiple sclerosis ([place])  Migraine without aura and without status migrainosus, not intractable  Dysesthesia  Other  fatigue  Neck pain  Other headache syndrome  Attention deficit disorder, unspecified hyperactivity presence   Continue Ocrevus.  Her next infusion is in January 2024 For the headache/neck pain: Bilateral splenius capitus and C5-C6 paraspinal muscle TPI with 80 mg DepoMedrol in [place] using sterile technique.   She tolerated the procedure well and there were no complications.  . As she is on OCP, there could be an interaction with modafinil.  Therefore, for her fatigue and MS related ADD I will add Concerta 27 mg .  She does not need to take the medicine every day. Return in 6 months or sooner if there are new or worsening neurologic symptoms.  Taden Witter A. Felecia Shelling, MD, [facility] 71/24/5809, 9:83 PM Certified in Neurology, Clinical Neurophysiology, Sleep Medicine and Neuroimaging  [facility] [address] [place], [place] [address] [phone]

## 2022-07-10 ENCOUNTER — Other Ambulatory Visit: Payer: 59

## 2022-07-10 ENCOUNTER — Encounter: Payer: Self-pay | Admitting: Nurse Practitioner

## 2022-07-10 ENCOUNTER — Ambulatory Visit (INDEPENDENT_AMBULATORY_CARE_PROVIDER_SITE_OTHER): Payer: 59 | Admitting: Nurse Practitioner

## 2022-07-10 ENCOUNTER — Other Ambulatory Visit (HOSPITAL_COMMUNITY): Payer: Self-pay

## 2022-07-10 VITALS — BP 106/68 | HR 68 | Ht 68.0 in | Wt 195.0 lb

## 2022-07-10 DIAGNOSIS — Z0000 Encounter for general adult medical examination without abnormal findings: Secondary | ICD-10-CM

## 2022-07-10 DIAGNOSIS — D519 Vitamin B12 deficiency anemia, unspecified: Secondary | ICD-10-CM

## 2022-07-10 DIAGNOSIS — R5383 Other fatigue: Secondary | ICD-10-CM

## 2022-07-10 DIAGNOSIS — Z6829 Body mass index (BMI) 29.0-29.9, adult: Secondary | ICD-10-CM

## 2022-07-10 DIAGNOSIS — Z0001 Encounter for general adult medical examination with abnormal findings: Secondary | ICD-10-CM

## 2022-07-10 DIAGNOSIS — E559 Vitamin D deficiency, unspecified: Secondary | ICD-10-CM

## 2022-07-10 MED ORDER — SEMAGLUTIDE-WEIGHT MANAGEMENT 1.7 MG/0.75ML ~~LOC~~ SOAJ
1.7000 mg | SUBCUTANEOUS | 2 refills | Status: DC
  Filled 2022-07-10 – 2022-07-27 (×2): qty 3, 28d supply, fill #0
  Filled 2022-08-26: qty 3, 28d supply, fill #1
  Filled 2022-09-23: qty 3, 28d supply, fill #2

## 2022-07-10 NOTE — Progress Notes (Signed)
Complete physical exam   Patient: Ashley Reed   DOB: [birth year]   21 y.o. Female  MRN: [id number] Visit Date: 07/10/2022    Chief Complaint  Patient presents with   Weight Check   Annual Exam   Subjective    Ashley Reed is a 21 y.o. female who presents today for a complete physical exam.  She reports consuming a  generally healthy and low calorie  diet. Exercise is limited by neurologic condition(s): multiple sclerosis. She generally feels well. She does have additional problems to discuss today.   HPI  Follow up for weight management Initial weight 07/13/2021 - 241 Most recent weight 05/10/2022 - 2 Today's weight 07/10/2022 - 195 Weight loss since most recent visit here 8 pounds  Total weight loss since starting weight management program - 48 pounds   Past Medical History:  Diagnosis Date   Asthma    MS (multiple sclerosis) ([place]) 01/07/2021   Past Surgical History:  Procedure Laterality Date   BREAST REDUCTION SURGERY     WISDOM TOOTH EXTRACTION     Social History   Socioeconomic History   Marital status: Single    Spouse name: Not on file   Number of children: Not on file   Years of education: Not on file   Highest education level: Bachelor's degree (e.g., BA, AB, BS)  Occupational History   Not on file  Tobacco Use   Smoking status: Never   Smokeless tobacco: Never  Vaping Use   Vaping Use: Never used  Substance and Sexual Activity   Alcohol use: Yes    Comment: on occasion   Drug use: Not Currently   Sexual activity: Not on file  Other Topics Concern   Not on file  Social History Narrative   Lives with mother   Right handed   Caffeine: 1-2 coffee in a week   Social Determinants of Health   Financial Resource Strain: Not on file  Food Insecurity: Not on file  Transportation Needs: Not on file  Physical Activity: Not on file  Stress: Not on file  Social Connections: Not on file  Intimate Partner Violence: Not on file   Family Status   Relation Name Status   Mother  Alive   Father  Alive   Brother  Alive   Brother  Alive   Brother  Alive   Family History  Problem Relation Age of Onset   Fibromyalgia Mother    Bipolar disorder Father    Asthma Brother    Asthma Brother    Asthma Brother    Mental illness Brother    No Known Allergies  Patient Care Team: Ronnell Freshwater, NP as PCP - General (Family Medicine)   Medications: Outpatient Medications Prior to Visit  Medication Sig   adapalene (DIFFERIN) 0.1 % cream APPLY PEA SIZED AMOUNT TO FINGER AND SPREAD THIN LAYER TO ACNE AREAS ONCE A DAY IN MORNING   albuterol (PROVENTIL HFA;VENTOLIN HFA) 108 (90 Base) MCG/ACT inhaler Inhale 2 puffs 15 minutes prior to exercise and every 4 hours prn cough, wheeze with asthma flare   amoxicillin (AMOXIL) 875 MG tablet Take 1 tablet (875 mg total) by mouth 2 (two) times daily.   B Complex Vitamins (VITAMIN B COMPLEX PO) Take by mouth.   cetirizine (ZYRTEC) 10 MG tablet Take 10 mg by mouth daily.   Cyanocobalamin (VITAMIN B-12 PO) Take by mouth.   diclofenac sodium (VOLTAREN) 1 % GEL Apply to large joint area up to three  times a day as needed   ergocalciferol (VITAMIN D2) 1.25 MG (50000 UT) capsule Take 1 capsule (50,000 Units total) by mouth once a week.   fluconazole (DIFLUCAN) 150 MG tablet Take 1 tablet by mouth once, may repeat in 3 days if symptoms persist   gabapentin (NEURONTIN) 300 MG capsule Take 1 capsule (300 mg total) by mouth 3 (three) times daily.   Galcanezumab-gnlm (EMGALITY) 120 MG/ML SOAJ Inject 120 mg into the skin every 30 (thirty) days.   meloxicam (MOBIC) 15 MG tablet Take 1 tablet (15 mg total) by mouth daily.   methylphenidate (CONCERTA) 27 MG PO CR tablet Take 1 tablet (27 mg total) by mouth every morning.   norethindrone-ethinyl estradiol-FE (BLISOVI FE 1/20) 1-20 MG-MCG tablet Take 1 tablet by mouth daily.   ondansetron (ZOFRAN-ODT) 4 MG disintegrating tablet Dissolve 1-2 tablets (4-8 mg total) by  mouth every 8 (eight) hours as needed for nausea or vomiting.   prednisoLONE acetate (PRED FORTE) 1 % ophthalmic suspension Place 1 drop into the right eye 4 (four) times daily. (Patient taking differently: Place 1 drop into the right eye 4 (four) times daily as needed.)   rizatriptan (MAXALT-MLT) 10 MG disintegrating tablet Dissolve 1 tablet (10 mg total) by mouth as needed for migraine. May repeat in 2 hours if needed   tiZANidine (ZANAFLEX) 2 MG tablet Take 1-2 tablets (2-4 mg total) by mouth at bedtime for spasticity   [DISCONTINUED] linaclotide (LINZESS) 145 MCG CAPS capsule Take 1 capsule (145 mcg total) by mouth daily before breakfast.   [DISCONTINUED] Semaglutide-Weight Management 1.7 MG/0.75ML SOAJ Inject 1.7 mg into the skin once a week.   Fluticasone-Salmeterol (ADVAIR) 250-50 MCG/DOSE AEPB INHALE 1 PUFF AS DIRECTED TWICE A DAY   No facility-administered medications prior to visit.    Review of Systems  Constitutional:  Negative for activity change, appetite change, chills, fatigue and fever.       8 pound weight loss since last visit   HENT:  Negative for congestion, postnasal drip, rhinorrhea, sinus pressure, sinus pain, sneezing and sore throat.   Eyes: Negative.   Respiratory:  Negative for cough, chest tightness, shortness of breath and wheezing.   Cardiovascular:  Negative for chest pain and palpitations.  Gastrointestinal:  Negative for abdominal pain, constipation, diarrhea, nausea and vomiting.  Endocrine: Negative for cold intolerance, heat intolerance, polydipsia and polyuria.  Genitourinary:  Negative for dyspareunia, dysuria, flank pain, frequency and urgency.  Musculoskeletal:  Negative for arthralgias, back pain and myalgias.  Skin:  Negative for rash.  Allergic/Immunologic: Negative for environmental allergies.  Neurological:  Positive for headaches. Negative for dizziness and weakness.  Hematological:  Negative for adenopathy.  Psychiatric/Behavioral:  The  patient is not nervous/anxious.         Objective     Today's Vitals   07/10/22 1611  BP: 106/68  Pulse: 68  SpO2: 100%  Weight: 195 lb (88.5 kg)  Height: _0  (1.727 m)   Body mass index is 29.65 kg/m.  BP Readings from Last 3 Encounters:  07/10/22 106/68  07/09/22 117/86  06/22/22 128/86    Wt Readings from Last 3 Encounters:  07/10/22 195 lb (88.5 kg)  07/09/22 197 lb 9.6 oz (89.6 kg)  06/20/22 200 lb 12.8 oz (91.1 kg)     Physical Exam Vitals and nursing note reviewed.  Constitutional:      Appearance: Normal appearance. She is well-developed.  HENT:     Head: Normocephalic and atraumatic.     Right Ear:  Tympanic membrane, ear canal and external ear normal.     Left Ear: Tympanic membrane, ear canal and external ear normal.     Nose: Nose normal.     Mouth/Throat:     Mouth: Mucous membranes are moist.     Pharynx: Oropharynx is clear.  Eyes:     Extraocular Movements: Extraocular movements intact.     Conjunctiva/sclera: Conjunctivae normal.     Pupils: Pupils are equal, round, and reactive to light.  Cardiovascular:     Rate and Rhythm: Normal rate and regular rhythm.     Pulses: Normal pulses.     Heart sounds: Normal heart sounds.  Pulmonary:     Effort: Pulmonary effort is normal.     Breath sounds: Normal breath sounds.  Abdominal:     General: Bowel sounds are normal. There is no distension.     Palpations: Abdomen is soft. There is no mass.     Tenderness: There is no abdominal tenderness. There is no right CVA tenderness, left CVA tenderness, guarding or rebound.     Hernia: No hernia is present.  Musculoskeletal:        General: Normal range of motion.     Cervical back: Normal range of motion and neck supple.  Lymphadenopathy:     Cervical: No cervical adenopathy.  Skin:    General: Skin is warm and dry.     Capillary Refill: Capillary refill takes less than 2 seconds.  Neurological:     General: No focal deficit present.     Mental  Status: She is alert and oriented to person, place, and time.  Psychiatric:        Mood and Affect: Mood normal.        Behavior: Behavior normal.        Thought Content: Thought content normal.        Judgment: Judgment normal.     Last depression screening scores   Row Labels 07/10/2022    4:13 PM 06/20/2022    1:31 PM 05/10/2022    4:01 PM  PHQ 2/9 Scores   Section Header. No data exists in this row.     PHQ - 2 Score   0 0 0  PHQ- 9 Score   0 0 0   Last fall risk screening   Row Labels 05/10/2022    4:01 PM  Fall Risk    Section Header. No data exists in this row.   Falls in the past year?   1  Number falls in past yr:   1  Injury with Fall?   0  Risk for fall due to :   History of fall(s)  Follow up   Falls evaluation completed    Results for orders placed or performed in visit on 07/10/22  TSH + free T4  Result Value Ref Range   TSH 1.160 0.450 - 4.500 uIU/mL   Free T4 1.13 0.82 - 1.77 ng/dL  VITAMIN D 25 Hydroxy (Vit-D Deficiency, Fractures)  Result Value Ref Range   Vit D, 25-Hydroxy 69.6 30.0 - 100.0 ng/mL  Hemoglobin A1c  Result Value Ref Range   Hgb A1c MFr Bld 5.1 4.8 - 5.6 %   Est. average glucose Bld gHb Est-mCnc 100 mg/dL  Lipid panel  Result Value Ref Range   Cholesterol, Total 160 100 - 199 mg/dL   Triglycerides 63 0 - 149 mg/dL   HDL 77 >39 mg/dL   VLDL Cholesterol Cal 13 5 - 40 mg/dL  LDL Chol Calc (NIH) 70 0 - 99 mg/dL   Chol/HDL Ratio 2.1 0.0 - 4.4 ratio  Comprehensive metabolic panel  Result Value Ref Range   Glucose 85 70 - 99 mg/dL   BUN 8 6 - 20 mg/dL   Creatinine, Ser 0.72 0.57 - 1.00 mg/dL   eGFR 120 >59 mL/min/1.73   BUN/Creatinine Ratio 11 9 - 23   Sodium 138 134 - 144 mmol/L   Potassium 4.5 3.5 - 5.2 mmol/L   Chloride 101 96 - 106 mmol/L   CO2 21 20 - 29 mmol/L   Calcium 10.0 8.7 - 10.2 mg/dL   Total Protein 7.2 6.0 - 8.5 g/dL   Albumin 5.0 4.0 - 5.0 g/dL   Globulin, Total 2.2 1.5 - 4.5 g/dL   Albumin/Globulin Ratio 2.3 (H)  1.2 - 2.2   Bilirubin Total 0.6 0.0 - 1.2 mg/dL   Alkaline Phosphatase 72 44 - 121 IU/L   AST 13 0 - 40 IU/L   ALT 11 0 - 32 IU/L  CBC  Result Value Ref Range   WBC 7.9 3.4 - 10.8 x10E3/uL   RBC 4.60 3.77 - 5.28 x10E6/uL   Hemoglobin 14.4 11.1 - 15.9 g/dL   Hematocrit 42.0 34.0 - 46.6 %   MCV 91 79 - 97 fL   MCH 31.3 26.6 - 33.0 pg   MCHC 34.3 31.5 - 35.7 g/dL   RDW 12.2 11.7 - 15.4 %   Platelets 363 150 - 450 x10E3/uL  B12 and Folate Panel  Result Value Ref Range   Vitamin B-12 611 232 - 1,245 pg/mL   Folate >20.0 >3.0 ng/mL  Ferritin  Result Value Ref Range   Ferritin 47 15 - 150 ng/mL    Assessment & Plan    1. Encounter for general adult medical examination with abnormal findings Annual physical today   2. Fatigue, unspecified type Check labs including full thyroid and anemia panels.  - TSH + free T4 - VITAMIN D 25 Hydroxy (Vit-D Deficiency, Fractures) - Hemoglobin A1c - Comprehensive metabolic panel - CBC - S93 and Folate Panel - Ferritin  3. Vitamin D deficiency Check vitamin d and treat deficiency as indicated  - VITAMIN D 25 Hydroxy (Vit-D Deficiency, Fractures)  4. BMI 29.0-29.9,adult Improving. Continue Wegovy 1.7 mg weekly. Continue 1500 calorie diet and incorporating exercise into daily routine. Check fasting lipid panel.  - Lipid panel - Semaglutide-Weight Management 1.7 MG/0.75ML SOAJ; Inject 1.7 mg into the skin once a week.  Dispense: 3 mL; Refill: 2  5. Anemia due to vitamin B12 deficiency, unspecified B12 deficiency type Check anemia panel and  treat deficiency as indicated  - CBC - B12 and Folate Panel - Ferritin  6. Healthcare maintenance Routine, fasting labs ordered during today's visit.  - TSH + free T4 - Hemoglobin A1c - Lipid panel - Comprehensive metabolic panel - CBC - T34 and Folate Panel - Ferritin    Immunization History  Administered Date(s) Administered   DTaP 12/05/1998, 02/07/1999, 04/27/1999, 07/17/2000, 04/07/2003    H1N1 07/24/2008   HIB (PRP-OMP) 12/05/1998, 02/07/1999, 11/03/1999, 03/05/2002   HIB (PRP-T) 12/05/1998, 02/07/1999, 11/03/1999, 03/05/2002   Hepatitis A 01/22/2008, 04/14/2010   Hepatitis A, Ped/Adol-2 Dose 01/22/2008, 04/14/2010   Hepatitis B, PED/ADOLESCENT 04/27/1999, 08/24/1999, 07/17/2000   IPV 12/05/1998, 02/07/1999, 04/27/1999, 04/07/2003   Influenza,inj,Quad PF,6+ Mos 07/25/2016, 08/07/2017, 08/07/2017, 06/13/2018, 06/22/2019, 08/10/2020, 06/01/2021   Influenza-Unspecified 06/01/2021   MMR 11/03/1999, 04/07/2003   Meningococcal Conjugate 04/23/2016   PFIZER(Purple Top)SARS-COV-2 Vaccination 11/25/2019, 12/15/2019, 06/24/2020  Tdap 04/14/2010, 09/06/2020   Unspecified SARS-COV-2 Vaccination 11/25/2019, 12/15/2019, 06/24/2020   Varicella 11/03/1999, 01/22/2008    Health Maintenance  Topic Date Due   HPV VACCINES (1 - 2-dose series) Never done   HIV Screening  Never done   PAP-Cervical Cytology Screening  Never done   COVID-19 Vaccine (7 - Mixed Product risk series) 08/19/2020   INFLUENZA VACCINE  04/17/2022   PAP SMEAR-Modifier  02/15/2023 (Originally 10/06/2019)   TETANUS/TDAP  09/06/2030   Hepatitis C Screening  Completed    Discussed health benefits of physical activity, and encouraged her to engage in regular exercise appropriate for her age and condition.  Problem List Items Addressed This Visit       Other   Encounter for general adult medical examination with abnormal findings - Primary   Fatigue   Relevant Orders   TSH + free T4 (Completed)   VITAMIN D 25 Hydroxy (Vit-D Deficiency, Fractures) (Completed)   Hemoglobin A1c (Completed)   Comprehensive metabolic panel (Completed)   CBC (Completed)   B12 and Folate Panel (Completed)   Ferritin (Completed)   Vitamin D deficiency   Relevant Orders   VITAMIN D 25 Hydroxy (Vit-D Deficiency, Fractures) (Completed)   BMI 30.0-30.9,adult   Relevant Medications   Semaglutide-Weight Management 1.7 MG/0.75ML SOAJ    Anemia due to vitamin B12 deficiency   Relevant Orders   CBC (Completed)   B12 and Folate Panel (Completed)   Ferritin (Completed)   Other Visit Diagnoses     Healthcare maintenance       Relevant Orders   TSH + free T4 (Completed)   Hemoglobin A1c (Completed)   Lipid panel (Completed)   Comprehensive metabolic panel (Completed)   CBC (Completed)   B12 and Folate Panel (Completed)   Ferritin (Completed)        Return in about 2 months (around 09/09/2022) for routine - weight management.        Ronnell Freshwater, NP  [facility] [phone] (phone) [phone] (fax)  [place]

## 2022-07-11 ENCOUNTER — Other Ambulatory Visit: Payer: 59

## 2022-07-11 DIAGNOSIS — E559 Vitamin D deficiency, unspecified: Secondary | ICD-10-CM | POA: Diagnosis not present

## 2022-07-11 DIAGNOSIS — Z0000 Encounter for general adult medical examination without abnormal findings: Secondary | ICD-10-CM | POA: Diagnosis not present

## 2022-07-11 DIAGNOSIS — R5383 Other fatigue: Secondary | ICD-10-CM | POA: Diagnosis not present

## 2022-07-11 DIAGNOSIS — D519 Vitamin B12 deficiency anemia, unspecified: Secondary | ICD-10-CM | POA: Diagnosis not present

## 2022-07-11 DIAGNOSIS — Z6829 Body mass index (BMI) 29.0-29.9, adult: Secondary | ICD-10-CM | POA: Diagnosis not present

## 2022-07-12 ENCOUNTER — Encounter: Payer: Self-pay | Admitting: Nurse Practitioner

## 2022-07-12 LAB — LIPID PANEL
Chol/HDL Ratio: 2.1 ratio (ref 0.0–4.4)
Cholesterol, Total: 160 mg/dL (ref 100–199)
HDL: 77 mg/dL (ref >39)
LDL Chol Calc (NIH): 70 mg/dL (ref 0–99)
Triglycerides: 63 mg/dL (ref 0–149)
VLDL Cholesterol Cal: 13 mg/dL (ref 5–40)

## 2022-07-12 LAB — COMPREHENSIVE METABOLIC PANEL
ALT: 11 IU/L (ref 0–32)
AST: 13 IU/L (ref 0–40)
Albumin/Globulin Ratio: 2.3 — ABNORMAL HIGH (ref 1.2–2.2)
Albumin: 5 g/dL (ref 4.0–5.0)
Alkaline Phosphatase: 72 IU/L (ref 44–121)
BUN/Creatinine Ratio: 11 (ref 9–23)
BUN: 8 mg/dL (ref 6–20)
Bilirubin Total: 0.6 mg/dL (ref 0.0–1.2)
CO2: 21 mmol/L (ref 20–29)
Calcium: 10 mg/dL (ref 8.7–10.2)
Chloride: 101 mmol/L (ref 96–106)
Creatinine, Ser: 0.72 mg/dL (ref 0.57–1.00)
Globulin, Total: 2.2 g/dL (ref 1.5–4.5)
Glucose: 85 mg/dL (ref 70–99)
Potassium: 4.5 mmol/L (ref 3.5–5.2)
Sodium: 138 mmol/L (ref 134–144)
Total Protein: 7.2 g/dL (ref 6.0–8.5)
eGFR: 120 mL/min/{1.73_m2} (ref >59)

## 2022-07-12 LAB — CBC
Hematocrit: 42 % (ref 34.0–46.6)
Hemoglobin: 14.4 g/dL (ref 11.1–15.9)
MCH: 31.3 pg (ref 26.6–33.0)
MCHC: 34.3 g/dL (ref 31.5–35.7)
MCV: 91 fL (ref 79–97)
Platelets: 363 10*3/uL (ref 150–450)
RBC: 4.6 x10E6/uL (ref 3.77–5.28)
RDW: 12.2 % (ref 11.7–15.4)
WBC: 7.9 10*3/uL (ref 3.4–10.8)

## 2022-07-12 LAB — HEMOGLOBIN A1C
Est. average glucose Bld gHb Est-mCnc: 100 mg/dL
Hgb A1c MFr Bld: 5.1 % (ref 4.8–5.6)

## 2022-07-12 LAB — TSH+FREE T4
Free T4: 1.13 ng/dL (ref 0.82–1.77)
TSH: 1.16 u[IU]/mL (ref 0.450–4.500)

## 2022-07-12 LAB — B12 AND FOLATE PANEL
Folate: >20 ng/mL (ref >3.0)
Vitamin B-12: 611 pg/mL (ref 232–1245)

## 2022-07-12 LAB — VITAMIN D 25 HYDROXY (VIT D DEFICIENCY, FRACTURES): Vit D, 25-Hydroxy: 69.6 ng/mL (ref 30.0–100.0)

## 2022-07-12 LAB — FERRITIN: Ferritin: 47 ng/mL (ref 15–150)

## 2022-07-12 NOTE — Progress Notes (Signed)
MyChart message sent to patient -   Labs look good

## 2022-07-23 ENCOUNTER — Encounter: Payer: Self-pay | Admitting: Neurology

## 2022-07-27 ENCOUNTER — Other Ambulatory Visit (HOSPITAL_COMMUNITY): Payer: Self-pay

## 2022-07-27 ENCOUNTER — Other Ambulatory Visit: Payer: Self-pay | Admitting: Nurse Practitioner

## 2022-07-27 DIAGNOSIS — R1032 Left lower quadrant pain: Secondary | ICD-10-CM

## 2022-07-27 DIAGNOSIS — K5904 Chronic idiopathic constipation: Secondary | ICD-10-CM

## 2022-07-27 MED ORDER — LINACLOTIDE 145 MCG PO CAPS
145.0000 ug | ORAL_CAPSULE | Freq: Every day | ORAL | 1 refills | Status: DC
  Filled 2022-07-27: qty 90, 90d supply, fill #0
  Filled 2022-11-17: qty 90, 90d supply, fill #1

## 2022-07-29 DIAGNOSIS — D519 Vitamin B12 deficiency anemia, unspecified: Secondary | ICD-10-CM | POA: Insufficient documentation

## 2022-07-30 ENCOUNTER — Other Ambulatory Visit (HOSPITAL_COMMUNITY): Payer: Self-pay

## 2022-08-03 ENCOUNTER — Encounter: Payer: Self-pay | Admitting: Nurse Practitioner

## 2022-08-08 ENCOUNTER — Other Ambulatory Visit (HOSPITAL_COMMUNITY): Payer: Self-pay

## 2022-08-08 DIAGNOSIS — L0231 Cutaneous abscess of buttock: Secondary | ICD-10-CM | POA: Diagnosis not present

## 2022-08-08 MED ORDER — CLINDAMYCIN PHOSPHATE 1 % EX GEL
Freq: Two times a day (BID) | CUTANEOUS | 0 refills | Status: AC
  Filled 2022-08-08: qty 30, 30d supply, fill #0

## 2022-08-18 IMAGING — DX DG THORACIC SPINE 2V
3 series · 3 of 3 positions shown · non-contrast
Comparison: None.

CLINICAL DATA: Bilateral leg numbness.

EXAM:
THORACIC SPINE 2 VIEWS

[t-spine ap]
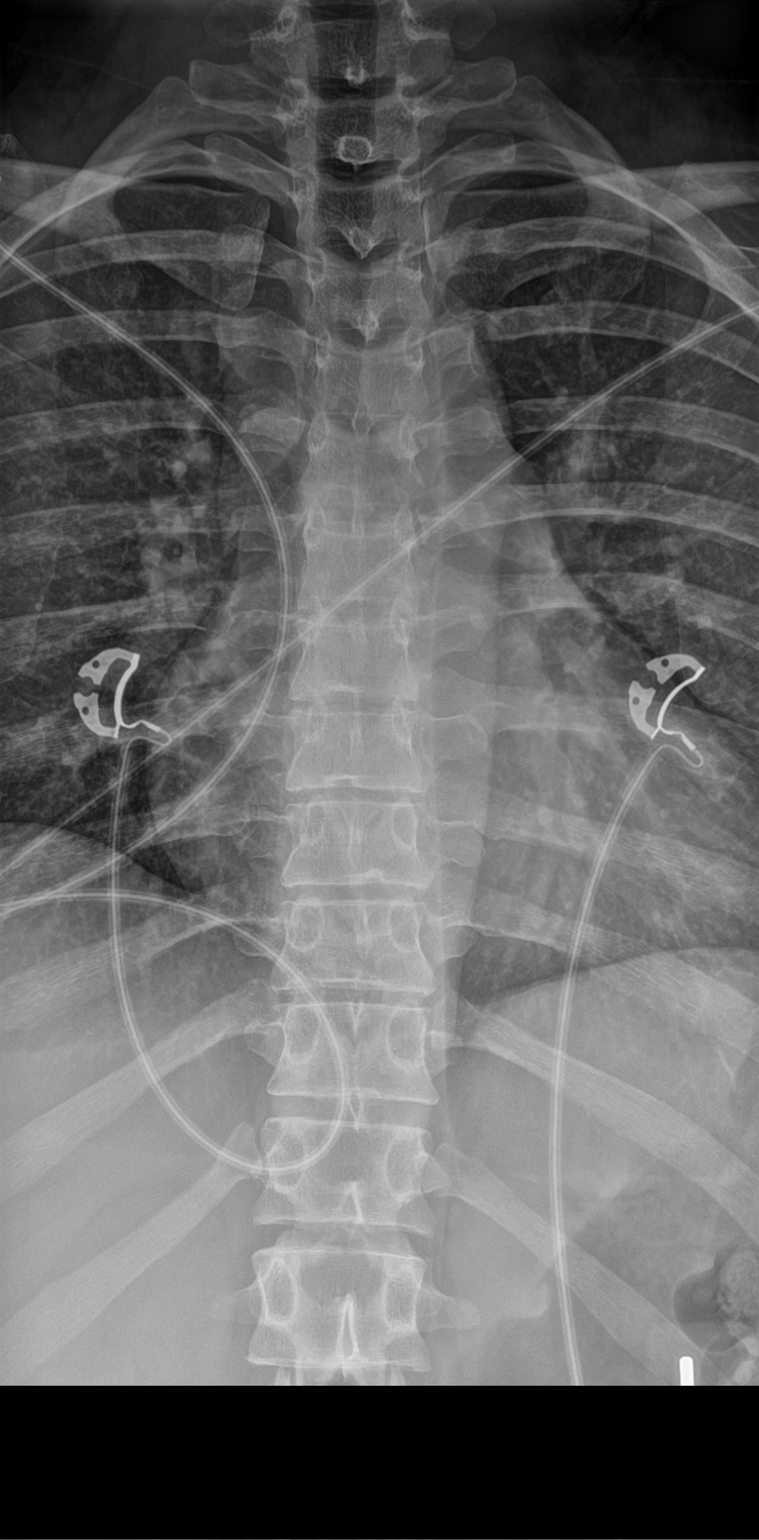

[t-spine lat]
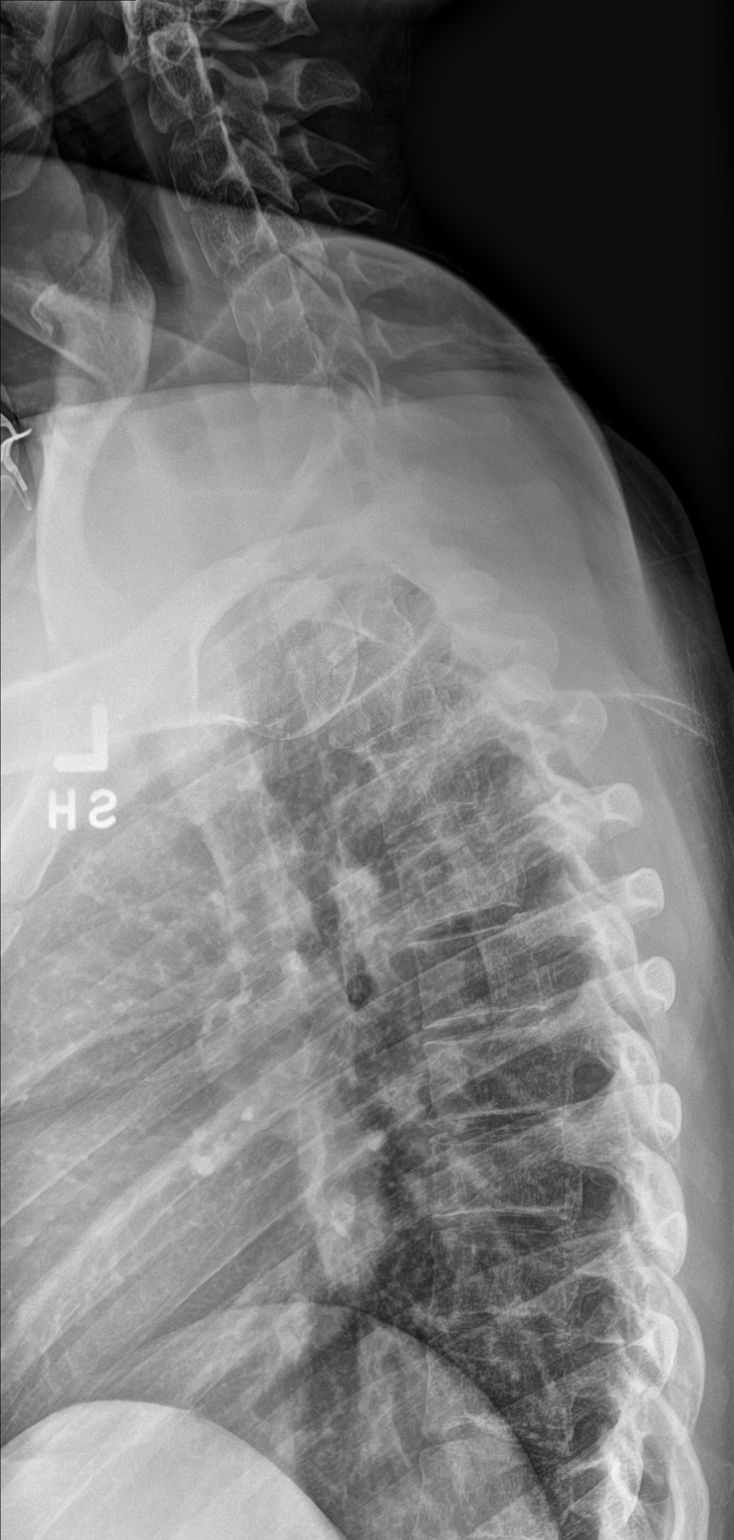

[ct-spine swimmers]
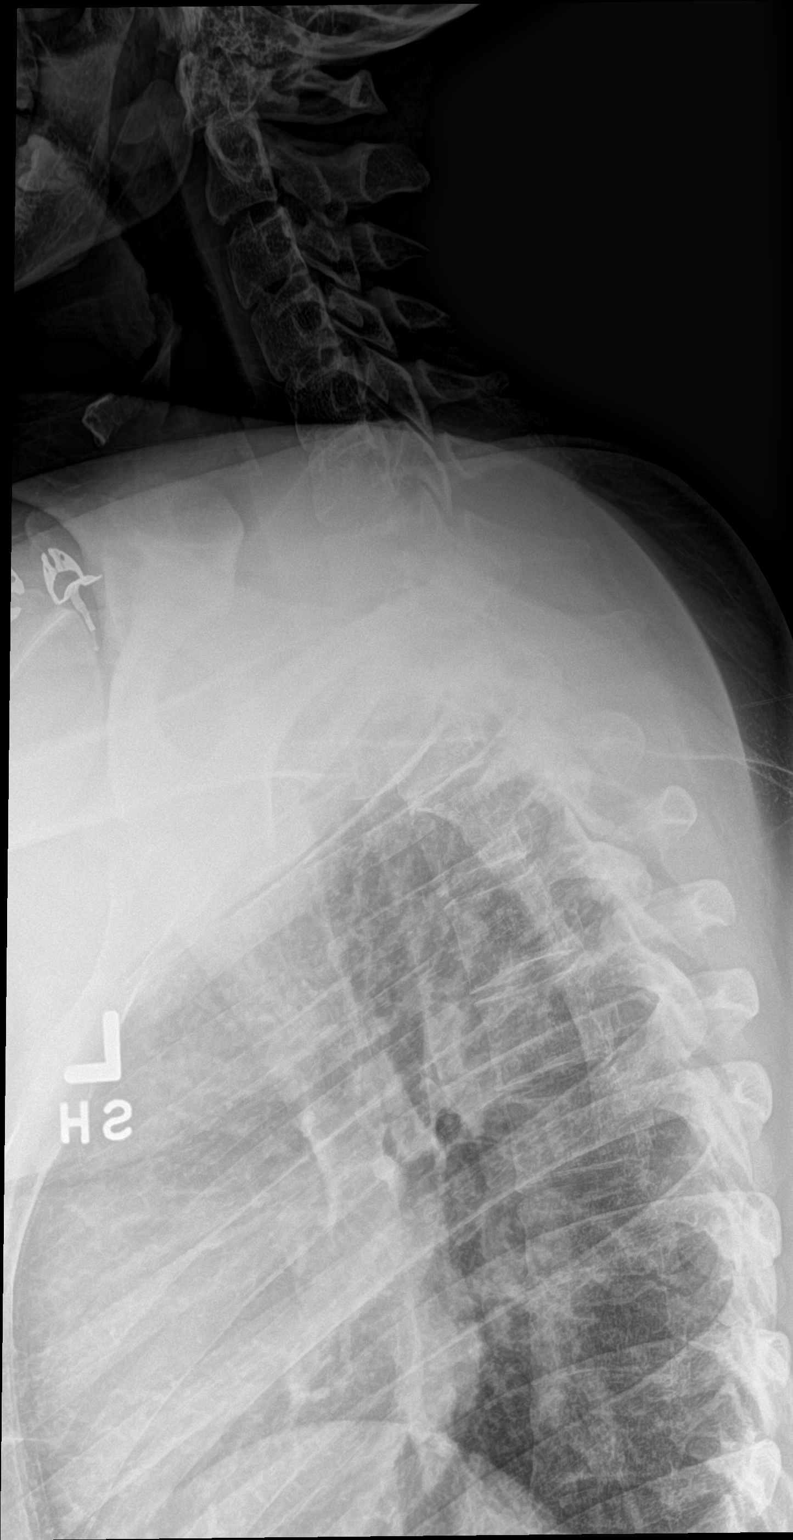

[3 of 3 positions shown; findings below may reference images not displayed]

FINDINGS: Minimal levoscoliosis of the upper thoracic spine. No evidence of
acute vertebral body subluxation. No fracture line or displaced
fracture fragment. No acute-appearing cortical irregularity or
osseous lesion. No vertebral body compression deformity. No
significant degenerative change is seen. Visualized paravertebral
soft tissues are unremarkable.
IMPRESSION: Negative.

## 2022-08-19 ENCOUNTER — Other Ambulatory Visit: Payer: Self-pay

## 2022-08-20 ENCOUNTER — Other Ambulatory Visit (HOSPITAL_COMMUNITY): Payer: Self-pay

## 2022-08-20 ENCOUNTER — Other Ambulatory Visit: Payer: Self-pay

## 2022-08-20 MED ORDER — GABAPENTIN 300 MG PO CAPS
300.0000 mg | ORAL_CAPSULE | Freq: Three times a day (TID) | ORAL | 0 refills | Status: DC
  Filled 2022-08-20: qty 90, 30d supply, fill #0

## 2022-08-27 ENCOUNTER — Encounter: Payer: Self-pay | Admitting: Nurse Practitioner

## 2022-08-27 ENCOUNTER — Telehealth: Payer: 59 | Admitting: Physician Assistant

## 2022-08-27 DIAGNOSIS — B9689 Other specified bacterial agents as the cause of diseases classified elsewhere: Secondary | ICD-10-CM

## 2022-08-27 DIAGNOSIS — J019 Acute sinusitis, unspecified: Secondary | ICD-10-CM

## 2022-08-27 MED ORDER — IPRATROPIUM BROMIDE 0.03 % NA SOLN: 2.0000 | Freq: Two times a day (BID) | NASAL | 0 refills | Status: DC

## 2022-08-27 MED ORDER — AMOXICILLIN-POT CLAVULANATE 875-125 MG PO TABS: 1.0000 | ORAL_TABLET | Freq: Two times a day (BID) | ORAL | 0 refills | Status: DC

## 2022-08-27 NOTE — Progress Notes (Signed)
Virtual Visit Consent   [place], you are scheduled for a virtual visit with a [facility] provider today. Just as with appointments in the office, your consent must be obtained to participate. Your consent will be active for this visit and any virtual visit you may have with one of our providers in the next 365 days. If you have a MyChart account, a copy of this consent can be sent to you electronically.  As this is a virtual visit, video technology does not allow for your provider to perform a traditional examination. This may limit your provider's ability to fully assess your condition. If your provider identifies any concerns that need to be evaluated in person or the need to arrange testing (such as labs, EKG, etc.), we will make arrangements to do so. Although advances in technology are sophisticated, we cannot ensure that it will always work on either your end or our end. If the connection with a video visit is poor, the visit may have to be switched to a telephone visit. With either a video or telephone visit, we are not always able to ensure that we have a secure connection.  By engaging in this virtual visit, you consent to the provision of healthcare and authorize for your insurance to be billed (if applicable) for the services provided during this visit. Depending on your insurance coverage, you may receive a charge related to this service.  I need to obtain your verbal consent now. Are you willing to proceed with your visit today? Ashley Reed has provided verbal consent on 08/27/2022 for a virtual visit (video or telephone). Ashley Daring, PA-C  Date: 08/27/2022 10:36 AM  Virtual Visit via Video Note   I, Ashley Reed, connected with  Ashley Reed  ([id number], 01-May-2001) on 08/27/22 at 10:30 AM EST by a video-enabled telemedicine application and verified that I am speaking with the correct person using two identifiers.  Location: Patient: Virtual Visit  Location Patient: Home Provider: Virtual Visit Location Provider: Home Office   I discussed the limitations of evaluation and management by telemedicine and the availability of in person appointments. The patient expressed understanding and agreed to proceed.    History of Present Illness: Ashley Reed is a 21 y.o. who identifies as a female who was assigned female at birth, and is being seen today for possible flu.  HPI: URI  This is a new problem. The current episode started 1 to 4 weeks ago. The problem has been gradually worsening. Maximum temperature: subjective fevers today. Associated symptoms include congestion, coughing (dry), diarrhea (did have some diarrhea overnight but has as a common side effect with MS medications), headaches, rhinorrhea, sinus pain and a sore throat (first day but now improved). Pertinent negatives include no ear pain, nausea, plugged ear sensation or vomiting. She has tried decongestant (mucinex) for the symptoms. The treatment provided no relief.   Negative at home covid testing x 3   Problems:  Patient Active Problem List   Diagnosis Date Noted   Anemia due to vitamin B12 deficiency 07/29/2022   Acute otitis media 06/20/2022   Chronic idiopathic constipation 01/20/2022   Noninfectious gastroenteritis 12/26/2021   LLQ cramping 12/26/2021   Acne vulgaris 10/13/2021   Neck pain 08/22/2021   Rash and nonspecific skin eruption 69/48/5462   Folliculitis 70/35/0093   Impaired fasting glucose 07/13/2021   Dysuria 07/13/2021   Encounter for general adult medical examination with abnormal findings 06/11/2021   Alopecia 06/11/2021  Fatigue 06/11/2021   Vitamin D deficiency 06/11/2021   BMI 30.0-30.9,adult 06/11/2021   Abnormal cervical Papanicolaou smear 05/03/2021   Encounter to establish care 05/03/2021   Multiple sclerosis ([place]) 05/03/2021   Relapsing remitting multiple sclerosis ([place]) 04/26/2021   High risk medication use 04/26/2021   Migraine  without aura and without status migrainosus, not intractable 04/26/2021   Dysesthesia 04/26/2021   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   B12 deficiency 03/09/2021   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   Paraplegia, unspecified ([place]) 01/06/2021   Asthma 10/20/2019   Migraine 10/20/2019    Allergies: No Known Allergies Medications:  Current Outpatient Medications:    amoxicillin-clavulanate (AUGMENTIN) 875-125 MG tablet, Take 1 tablet by mouth 2 (two) times daily., Disp: 14 tablet, Rfl: 0   ipratropium (ATROVENT) 0.03 % nasal spray, Place 2 sprays into both nostrils every 12 (twelve) hours., Disp: 30 mL, Rfl: 0   adapalene (DIFFERIN) 0.1 % cream, APPLY PEA SIZED AMOUNT TO FINGER AND SPREAD THIN LAYER TO ACNE AREAS ONCE A DAY IN MORNING, Disp: 45 g, Rfl: 1   albuterol (PROVENTIL HFA;VENTOLIN HFA) 108 (90 Base) MCG/ACT inhaler, Inhale 2 puffs 15 minutes prior to exercise and every 4 hours prn cough, wheeze with asthma flare, Disp: , Rfl:    B Complex Vitamins (VITAMIN B COMPLEX PO), Take by mouth., Disp: , Rfl:    cetirizine (ZYRTEC) 10 MG tablet, Take 10 mg by mouth daily., Disp: , Rfl:    clindamycin (CLINDAGEL) 1 % gel, APPLY A THIN LAYER TO THE AFFECTED AREA(S)  2 TIMES PER DAY, Disp: 30 g, Rfl: 0   Cyanocobalamin (VITAMIN B-12 PO), Take by mouth., Disp: , Rfl:    diclofenac sodium (VOLTAREN) 1 % GEL, Apply to large joint area up to three times a day as needed, Disp: 2 Tube, Rfl: 1   ergocalciferol (VITAMIN D2) 1.25 MG (50000 UT) capsule, Take 1 capsule (50,000 Units total) by mouth once a week., Disp: 12 capsule, Rfl: 1   fluconazole (DIFLUCAN) 150 MG tablet, Take 1 tablet by mouth once, may repeat in 3 days if symptoms persist, Disp: 3 tablet, Rfl: 0   Fluticasone-Salmeterol (ADVAIR) 250-50 MCG/DOSE AEPB, INHALE 1 PUFF AS DIRECTED TWICE A DAY, Disp: 60 each, Rfl: 2   gabapentin (NEURONTIN) 300 MG capsule, Take 1 capsule (300 mg total) by mouth 3 (three) times daily., Disp: 90 capsule, Rfl: 0    Galcanezumab-gnlm (EMGALITY) 120 MG/ML SOAJ, Inject 120 mg into the skin every 30 (thirty) days., Disp: 1 mL, Rfl: 11   linaclotide (LINZESS) 145 MCG CAPS capsule, Take 1 capsule (145 mcg total) by mouth daily before breakfast., Disp: 90 capsule, Rfl: 1   meloxicam (MOBIC) 15 MG tablet, Take 1 tablet (15 mg total) by mouth daily., Disp: 30 tablet, Rfl: 3   methylphenidate (CONCERTA) 27 MG PO CR tablet, Take 1 tablet (27 mg total) by mouth every morning., Disp: 30 tablet, Rfl: 0   norethindrone-ethinyl estradiol-FE (BLISOVI FE 1/20) 1-20 MG-MCG tablet, Take 1 tablet by mouth daily., Disp: 84 tablet, Rfl: 2   ondansetron (ZOFRAN-ODT) 4 MG disintegrating tablet, Dissolve 1-2 tablets (4-8 mg total) by mouth every 8 (eight) hours as needed for nausea or vomiting., Disp: 30 tablet, Rfl: 1   prednisoLONE acetate (PRED FORTE) 1 % ophthalmic suspension, Place 1 drop into the right eye 4 (four) times daily. (Patient taking differently: Place 1 drop into the right eye 4 (four) times daily as needed.), Disp: 10 mL, Rfl: 0   rizatriptan (MAXALT-MLT) 10  MG disintegrating tablet, Dissolve 1 tablet (10 mg total) by mouth as needed for migraine. May repeat in 2 hours if needed, Disp: 9 tablet, Rfl: 7   Semaglutide-Weight Management 1.7 MG/0.75ML SOAJ, Inject 1.7 mg into the skin once a week., Disp: 3 mL, Rfl: 2   tiZANidine (ZANAFLEX) 2 MG tablet, Take 1-2 tablets (2-4 mg total) by mouth at bedtime for spasticity, Disp: 60 tablet, Rfl: 11   Observations/Objective: Patient is well-developed, well-nourished in no acute distress.  Resting comfortably at home.  Head is normocephalic, atraumatic.  No labored breathing.  Speech is clear and coherent with logical content.  Patient is alert and oriented at baseline.    Assessment and Plan: 1. Acute bacterial sinusitis - amoxicillin-clavulanate (AUGMENTIN) 875-125 MG tablet; Take 1 tablet by mouth 2 (two) times daily.  Dispense: 14 tablet; Refill: 0 - ipratropium  (ATROVENT) 0.03 % nasal spray; Place 2 sprays into both nostrils every 12 (twelve) hours.  Dispense: 30 mL; Refill: 0  - Worsening symptoms that have not responded to OTC medications.  - Will give Augmentin and Ipratropium Bromide - Continue allergy medications.  - Steam and humidifier can help - Stay well hydrated and get plenty of rest.  - Seek in person evaluation if no symptom improvement or if symptoms worsen   Follow Up Instructions: I discussed the assessment and treatment plan with the patient. The patient was provided an opportunity to ask questions and all were answered. The patient agreed with the plan and demonstrated an understanding of the instructions.  A copy of instructions were sent to the patient via MyChart unless otherwise noted below.    The patient was advised to call back or seek an in-person evaluation if the symptoms worsen or if the condition fails to improve as anticipated.  Time:  I spent 8 minutes with the patient via telehealth technology discussing the above problems/concerns.    Ashley Daring, PA-C

## 2022-08-27 NOTE — Patient Instructions (Signed)
[place], thank you for joining Mar Daring, PA-C for today's virtual visit.  While this provider is not your primary care provider (PCP), if your PCP is located in our provider database this encounter information will be shared with them immediately following your visit.   [facility] account gives you access to today's visit and all your visits, tests, and labs performed at [facility] " click here if you don't have a [facility] account or go to [email or web address]  Consent: (Patient) Ashley Reed provided verbal consent for this virtual visit at the beginning of the encounter.  Current Medications:  Current Outpatient Medications:    amoxicillin-clavulanate (AUGMENTIN) 875-125 MG tablet, Take 1 tablet by mouth 2 (two) times daily., Disp: 14 tablet, Rfl: 0   ipratropium (ATROVENT) 0.03 % nasal spray, Place 2 sprays into both nostrils every 12 (twelve) hours., Disp: 30 mL, Rfl: 0   adapalene (DIFFERIN) 0.1 % cream, APPLY PEA SIZED AMOUNT TO FINGER AND SPREAD THIN LAYER TO ACNE AREAS ONCE A DAY IN MORNING, Disp: 45 g, Rfl: 1   albuterol (PROVENTIL HFA;VENTOLIN HFA) 108 (90 Base) MCG/ACT inhaler, Inhale 2 puffs 15 minutes prior to exercise and every 4 hours prn cough, wheeze with asthma flare, Disp: , Rfl:    B Complex Vitamins (VITAMIN B COMPLEX PO), Take by mouth., Disp: , Rfl:    cetirizine (ZYRTEC) 10 MG tablet, Take 10 mg by mouth daily., Disp: , Rfl:    clindamycin (CLINDAGEL) 1 % gel, APPLY A THIN LAYER TO THE AFFECTED AREA(S)  2 TIMES PER DAY, Disp: 30 g, Rfl: 0   Cyanocobalamin (VITAMIN B-12 PO), Take by mouth., Disp: , Rfl:    diclofenac sodium (VOLTAREN) 1 % GEL, Apply to large joint area up to three times a day as needed, Disp: 2 Tube, Rfl: 1   ergocalciferol (VITAMIN D2) 1.25 MG (50000 UT) capsule, Take 1 capsule (50,000 Units total) by mouth once a week., Disp: 12 capsule, Rfl: 1   fluconazole (DIFLUCAN) 150 MG tablet, Take 1  tablet by mouth once, may repeat in 3 days if symptoms persist, Disp: 3 tablet, Rfl: 0   Fluticasone-Salmeterol (ADVAIR) 250-50 MCG/DOSE AEPB, INHALE 1 PUFF AS DIRECTED TWICE A DAY, Disp: 60 each, Rfl: 2   gabapentin (NEURONTIN) 300 MG capsule, Take 1 capsule (300 mg total) by mouth 3 (three) times daily., Disp: 90 capsule, Rfl: 0   Galcanezumab-gnlm (EMGALITY) 120 MG/ML SOAJ, Inject 120 mg into the skin every 30 (thirty) days., Disp: 1 mL, Rfl: 11   linaclotide (LINZESS) 145 MCG CAPS capsule, Take 1 capsule (145 mcg total) by mouth daily before breakfast., Disp: 90 capsule, Rfl: 1   meloxicam (MOBIC) 15 MG tablet, Take 1 tablet (15 mg total) by mouth daily., Disp: 30 tablet, Rfl: 3   methylphenidate (CONCERTA) 27 MG PO CR tablet, Take 1 tablet (27 mg total) by mouth every morning., Disp: 30 tablet, Rfl: 0   norethindrone-ethinyl estradiol-FE (BLISOVI FE 1/20) 1-20 MG-MCG tablet, Take 1 tablet by mouth daily., Disp: 84 tablet, Rfl: 2   ondansetron (ZOFRAN-ODT) 4 MG disintegrating tablet, Dissolve 1-2 tablets (4-8 mg total) by mouth every 8 (eight) hours as needed for nausea or vomiting., Disp: 30 tablet, Rfl: 1   prednisoLONE acetate (PRED FORTE) 1 % ophthalmic suspension, Place 1 drop into the right eye 4 (four) times daily. (Patient taking differently: Place 1 drop into the right eye 4 (four) times daily as needed.), Disp: 10 mL, Rfl: 0  rizatriptan (MAXALT-MLT) 10 MG disintegrating tablet, Dissolve 1 tablet (10 mg total) by mouth as needed for migraine. May repeat in 2 hours if needed, Disp: 9 tablet, Rfl: 7   Semaglutide-Weight Management 1.7 MG/0.75ML SOAJ, Inject 1.7 mg into the skin once a week., Disp: 3 mL, Rfl: 2   tiZANidine (ZANAFLEX) 2 MG tablet, Take 1-2 tablets (2-4 mg total) by mouth at bedtime for spasticity, Disp: 60 tablet, Rfl: 11   Medications ordered in this encounter:  Meds ordered this encounter  Medications   amoxicillin-clavulanate (AUGMENTIN) 875-125 MG tablet    Sig:  Take 1 tablet by mouth 2 (two) times daily.    Dispense:  14 tablet    Refill:  0    Order Specific Question:   Supervising Provider    Answer:   Chase Picket [[id number]]   ipratropium (ATROVENT) 0.03 % nasal spray    Sig: Place 2 sprays into both nostrils every 12 (twelve) hours.    Dispense:  30 mL    Refill:  0    Order Specific Question:   Supervising Provider    Answer:   Chase Picket [[id number]]     *If you need refills on other medications prior to your next appointment, please contact your pharmacy*  Follow-Up: Call back or seek an in-person evaluation if the symptoms worsen or if the condition fails to improve as anticipated.  [place] [phone]  Other Instructions  Sinus Infection, Adult A sinus infection, also called sinusitis, is inflammation of your sinuses. Sinuses are hollow spaces in the bones around your face. Your sinuses are located: Around your eyes. In the middle of your forehead. Behind your nose. In your cheekbones. Mucus normally drains out of your sinuses. When your nasal tissues become inflamed or swollen, mucus can become trapped or blocked. This allows bacteria, viruses, and fungi to grow, which leads to infection. Most infections of the sinuses are caused by a virus. A sinus infection can develop quickly. It can last for up to 4 weeks (acute) or for more than 12 weeks (chronic). A sinus infection often develops after a cold. What are the causes? This condition is caused by anything that creates swelling in the sinuses or stops mucus from draining. This includes: Allergies. Asthma. Infection from bacteria or viruses. Deformities or blockages in your nose or sinuses. Abnormal growths in the nose (nasal polyps). Pollutants, such as chemicals or irritants in the air. Infection from fungi. This is rare. What increases the risk? You are more likely to develop this condition if you: Have a weak body defense system (immune  system). Do a lot of swimming or diving. Overuse nasal sprays. Smoke. What are the signs or symptoms? The main symptoms of this condition are pain and a feeling of pressure around the affected sinuses. Other symptoms include: Stuffy nose or congestion that makes it difficult to breathe through your nose. Thick yellow or greenish drainage from your nose. Tenderness, swelling, and warmth over the affected sinuses. A cough that may get worse at night. Decreased sense of smell and taste. Extra mucus that collects in the throat or the back of the nose (postnasal drip) causing a sore throat or bad breath. Tiredness (fatigue). Fever. How is this diagnosed? This condition is diagnosed based on: Your symptoms. Your medical history. A physical exam. Tests to find out if your condition is acute or chronic. This may include: Checking your nose for nasal polyps. Viewing your sinuses using a device that  has a light (endoscope). Testing for allergies or bacteria. Imaging tests, such as an MRI or CT scan. In rare cases, a bone biopsy may be done to rule out more serious types of fungal sinus disease. How is this treated? Treatment for a sinus infection depends on the cause and whether your condition is chronic or acute. If caused by a virus, your symptoms should go away on their own within 10 days. You may be given medicines to relieve symptoms. They include: Medicines that shrink swollen nasal passages (decongestants). A spray that eases inflammation of the nostrils (topical intranasal corticosteroids). Rinses that help get rid of thick mucus in your nose (nasal saline washes). Medicines that treat allergies (antihistamines). Over-the-counter pain relievers. If caused by bacteria, your health care provider may recommend waiting to see if your symptoms improve. Most bacterial infections will get better without antibiotic medicine. You may be given antibiotics if you have: A severe infection. A  weak immune system. If caused by narrow nasal passages or nasal polyps, surgery may be needed. Follow these instructions at home: Medicines Take, use, or apply over-the-counter and prescription medicines only as told by your health care provider. These may include nasal sprays. If you were prescribed an antibiotic medicine, take it as told by your health care provider. Do not stop taking the antibiotic even if you start to feel better. Hydrate and humidify  Drink enough fluid to keep your urine pale yellow. Staying hydrated will help to thin your mucus. Use a cool mist humidifier to keep the humidity level in your home above 50%. Inhale steam for 10-15 minutes, 3-4 times a day, or as told by your health care provider. You can do this in the bathroom while a hot shower is running. Limit your exposure to cool or dry air. Rest Rest as much as possible. Sleep with your head raised (elevated). Make sure you get enough sleep each night. General instructions  Apply a warm, moist washcloth to your face 3-4 times a day or as told by your health care provider. This will help with discomfort. Use nasal saline washes as often as told by your health care provider. Wash your hands often with soap and water to reduce your exposure to germs. If soap and water are not available, use hand sanitizer. Do not smoke. Avoid being around people who are smoking (secondhand smoke). Keep all follow-up visits. This is important. Contact a health care provider if: You have a fever. Your symptoms get worse. Your symptoms do not improve within 10 days. Get help right away if: You have a severe headache. You have persistent vomiting. You have severe pain or swelling around your face or eyes. You have vision problems. You develop confusion. Your neck is stiff. You have trouble breathing. These symptoms may be an emergency. Get help right away. Call 911. Do not wait to see if the symptoms will go away. Do not  drive yourself to the hospital. Summary A sinus infection is soreness and inflammation of your sinuses. Sinuses are hollow spaces in the bones around your face. This condition is caused by nasal tissues that become inflamed or swollen. The swelling traps or blocks the flow of mucus. This allows bacteria, viruses, and fungi to grow, which leads to infection. If you were prescribed an antibiotic medicine, take it as told by your health care provider. Do not stop taking the antibiotic even if you start to feel better. Keep all follow-up visits. This is important. This information is not  intended to replace advice given to you by your health care provider. Make sure you discuss any questions you have with your health care provider. Document Revised: 08/08/2021 Document Reviewed: 08/08/2021 Elsevier Patient Education  [place].    If you have been instructed to have an in-person evaluation today at a local Urgent Care facility, please use the link below. It will take you to a list of all of our available [facility], including address, phone number and hours of operation. Please do not delay care.  [facility]  If you or a family member do not have a primary care provider, use the link below to schedule a visit and establish care. When you choose a [facility] primary care physician or advanced practice provider, you gain a long-term partner in health. Find a Primary Care Provider  Learn more about [facility]'s in-office and virtual care options: [facility]

## 2022-09-04 ENCOUNTER — Ambulatory Visit (INDEPENDENT_AMBULATORY_CARE_PROVIDER_SITE_OTHER): Payer: 59 | Admitting: Neurology

## 2022-09-04 ENCOUNTER — Encounter: Payer: Self-pay | Admitting: Neurology

## 2022-09-04 VITALS — BP 114/72 | HR 81 | Ht 69.0 in | Wt 189.5 lb

## 2022-09-04 DIAGNOSIS — Z79899 Other long term (current) drug therapy: Secondary | ICD-10-CM | POA: Diagnosis not present

## 2022-09-04 DIAGNOSIS — G35 Multiple sclerosis: Secondary | ICD-10-CM

## 2022-09-04 DIAGNOSIS — G43009 Migraine without aura, not intractable, without status migrainosus: Secondary | ICD-10-CM

## 2022-09-04 DIAGNOSIS — R5383 Other fatigue: Secondary | ICD-10-CM | POA: Diagnosis not present

## 2022-09-04 DIAGNOSIS — M542 Cervicalgia: Secondary | ICD-10-CM

## 2022-09-04 NOTE — Progress Notes (Signed)
[facility]  PATIENT: Ashley Reed DOB: [birth year]  REFERRING DOCTOR OR PCP:  Nathaneil Canary, MD SOURCE: Patient, notes from [facility], imaging and lab results, MRI images personally reviewed  _________________________________   HISTORICAL  CHIEF COMPLAINT:  Chief Complaint  Patient presents with   Follow-up    Pt in room #11 with her mother. Pt here today for f/u on her MS.    Ashley, Reed a 21 y.o. woman with multiple sclerosis and headaches.  Update 09/04/2022: Her MS is stable.  Her first Ocrevus infusion were July 2022 and next one will be October 18, 2021.    She had some GI issues with the last infusion   Sometimes she gets a tight sensation in her throat but Benadryl seems to help.       Currently headache is mild.  It was more severe at the last visit.  Splenius capitis trigger point injections/occipital nerve blocks greatly helped her pain until a few weeks ago.  She does continue tizanidine 4 mg nightly.   She continues on [employer].    Currently gait is doing about the same as at the last visit and no falls.     Her legs tingle at times, especially if she walks long distances.  She sometimes gets facial numbness.   Balance is mildly reduced and she uses the rails on stairs.      She sometimes drops items out of hands.   She has more urinary dysfunction.   She has hesitancy and mild trouble with incomplete emptying.   Flomax was not tolerated and did not help her much during the couple days that she took it.        Cognition is the same with occasional memory issues.  .   Modafinil helped fatigue some but she stopped due to interaction with OCP.   She sleeps well at night and sometimes sleeps > 12 hours.  She does not doze off during the day much.   She denies mood issues.     She has had lower back pain x many years.   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   She has had LBP  and occasional leg pain.   A few times she has had her back lock up due to muscle spasms.    This improved with muscle relaxant.    MS HISTORY: She is a 21 year old woman who had numbness from the waist down lasting 1 month in January and February.    She did not have any balance issues.   She also had mild urinary retention.   In April, she had the onset of numbness form the breast down and also had poor balance.   With symptoms persisting she saw the ED at [facility] break but MRI was not up.   She returned to school at [employer] and had an MRI performed 01/06/2021.  This was about 3 weeks after the onset of symptoms.   She was admitted to [facility] and did 7 days of IV Solu-medrol.  Symptoms improved.    She saw Dr. Concha Pyo.   She was started on Ocrevus.  Her last infusion was March 30, 2021.  She noticed issues with stomach pain, nausea lasting a couple weeks from the infusion.  Additionally she had some hair loss since the last infusion.   Imaging personally reviewed (and shown to patient): MRI of the brain 01/06/2021 shows multiple T2/FLAIR hyperintense  foci, predominantly in the periventricular white matter including the callosal septal fibers.  1 focus is also in the frontal juxtacortical white matter on the left.  None of the foci enhance.    Cervical, thoracic and lumbar spine MRI 01/06/2021 showed a large T2 hyperintense field is adjacent to T6.  It showed faint enhancement degenerative changes are noted at L3-L4, L4-L5 and L5-S1.[name]  There is spinal stenosis at L3-L4 .      Pertinent laboratory tests: 01/07/2021: CSF showed 3 oligoclonal bands.  Myelin basic protein was elevated. 02/10/2021: Hepatitis panel was negative.  Vitamin D was mildly low.  TSH was normal REVIEW OF SYSTEMS: Constitutional: No fevers, chills, sweats, or change in appetite Eyes: No visual changes, double vision, eye pain Ear, nose and throat: No hearing loss, ear pain, nasal congestion, sore throat Cardiovascular: No chest  pain, palpitations Respiratory:  No shortness of breath at rest or with exertion.   No wheezes GastrointestinaI: No nausea, vomiting, diarrhea, abdominal pain, fecal incontinence Genitourinary:  No dysuria, urinary retention or frequency.  No nocturia. Musculoskeletal:  No neck pain, back pain Integumentary: No rash, pruritus, skin lesions Neurological: as above Psychiatric: No depression at this time.  No anxiety Endocrine: No palpitations, diaphoresis, change in appetite, change in weigh or increased thirst Hematologic/Lymphatic:  No anemia, purpura, petechiae. Allergic/Immunologic: No itchy/runny eyes, nasal congestion, recent allergic reactions, rashes  ALLERGIES: No Known Allergies  HOME MEDICATIONS:  Current Outpatient Medications:    adapalene (DIFFERIN) 0.1 % cream, APPLY PEA SIZED AMOUNT TO FINGER AND SPREAD THIN LAYER TO ACNE AREAS ONCE A DAY IN MORNING, Disp: 45 g, Rfl: 1   albuterol (PROVENTIL HFA;VENTOLIN HFA) 108 (90 Base) MCG/ACT inhaler, Inhale 2 puffs 15 minutes prior to exercise and every 4 hours prn cough, wheeze with asthma flare, Disp: , Rfl:    amoxicillin-clavulanate (AUGMENTIN) 875-125 MG tablet, Take 1 tablet by mouth 2 (two) times daily., Disp: 14 tablet, Rfl: 0   B Complex Vitamins (VITAMIN B COMPLEX PO), Take by mouth., Disp: , Rfl:    cetirizine (ZYRTEC) 10 MG tablet, Take 10 mg by mouth daily., Disp: , Rfl:    clindamycin (CLINDAGEL) 1 % gel, APPLY A THIN LAYER TO THE AFFECTED AREA(S)  2 TIMES PER DAY, Disp: 30 g, Rfl: 0   Cyanocobalamin (VITAMIN B-12 PO), Take by mouth., Disp: , Rfl:    diclofenac sodium (VOLTAREN) 1 % GEL, Apply to large joint area up to three times a day as needed, Disp: 2 Tube, Rfl: 1   ergocalciferol (VITAMIN D2) 1.25 MG (50000 UT) capsule, Take 1 capsule (50,000 Units total) by mouth once a week., Disp: 12 capsule, Rfl: 1   fluconazole (DIFLUCAN) 150 MG tablet, Take 1 tablet by mouth once, may repeat in 3 days if symptoms persist, Disp:  3 tablet, Rfl: 0   gabapentin (NEURONTIN) 300 MG capsule, Take 1 capsule (300 mg total) by mouth 3 (three) times daily., Disp: 90 capsule, Rfl: 0   Galcanezumab-gnlm (EMGALITY) 120 MG/ML SOAJ, Inject 120 mg into the skin every 30 (thirty) days., Disp: 1 mL, Rfl: 11   ipratropium (ATROVENT) 0.03 % nasal spray, Place 2 sprays into both nostrils every 12 (twelve) hours., Disp: 30 mL, Rfl: 0   linaclotide (LINZESS) 145 MCG CAPS capsule, Take 1 capsule (145 mcg total) by mouth daily before breakfast., Disp: 90 capsule, Rfl: 1   meloxicam (MOBIC) 15 MG tablet, Take 1 tablet (15 mg total) by mouth daily., Disp: 30 tablet, Rfl: 3  norethindrone-ethinyl estradiol-FE (BLISOVI FE 1/20) 1-20 MG-MCG tablet, Take 1 tablet by mouth daily., Disp: 84 tablet, Rfl: 2   ondansetron (ZOFRAN-ODT) 4 MG disintegrating tablet, Dissolve 1-2 tablets (4-8 mg total) by mouth every 8 (eight) hours as needed for nausea or vomiting., Disp: 30 tablet, Rfl: 1   prednisoLONE acetate (PRED FORTE) 1 % ophthalmic suspension, Place 1 drop into the right eye 4 (four) times daily. (Patient taking differently: Place 1 drop into the right eye 4 (four) times daily as needed.), Disp: 10 mL, Rfl: 0   rizatriptan (MAXALT-MLT) 10 MG disintegrating tablet, Dissolve 1 tablet (10 mg total) by mouth as needed for migraine. May repeat in 2 hours if needed, Disp: 9 tablet, Rfl: 7   Semaglutide-Weight Management 1.7 MG/0.75ML SOAJ, Inject 1.7 mg into the skin once a week., Disp: 3 mL, Rfl: 2   tiZANidine (ZANAFLEX) 2 MG tablet, Take 1-2 tablets (2-4 mg total) by mouth at bedtime for spasticity, Disp: 60 tablet, Rfl: 11   Fluticasone-Salmeterol (ADVAIR) 250-50 MCG/DOSE AEPB, INHALE 1 PUFF AS DIRECTED TWICE A DAY, Disp: 60 each, Rfl: 2  PAST MEDICAL HISTORY: Past Medical History:  Diagnosis Date   Asthma    MS (multiple sclerosis) ([place]) 01/07/2021    PAST SURGICAL HISTORY: Past Surgical History:  Procedure Laterality Date   BREAST REDUCTION  SURGERY     WISDOM TOOTH EXTRACTION      FAMILY HISTORY: Family History  Problem Relation Age of Onset   Fibromyalgia Mother    Bipolar disorder Father    Asthma Brother    Asthma Brother    Asthma Brother    Mental illness Brother     SOCIAL HISTORY:  Social History   Socioeconomic History   Marital status: Single    Spouse name: Not on file   Number of children: Not on file   Years of education: Not on file   Highest education level: Bachelor's degree (e.g., BA, AB, BS)  Occupational History   Not on file  Tobacco Use   Smoking status: Never   Smokeless tobacco: Never  Vaping Use   Vaping Use: Never used  Substance and Sexual Activity   Alcohol use: Yes    Comment: on occasion   Drug use: Not Currently   Sexual activity: Not on file  Other Topics Concern   Not on file  Social History Narrative   Lives with mother   Right handed   Caffeine: 1-2 coffee in a week   Social Determinants of Health   Financial Resource Strain: Not on file  Food Insecurity: Not on file  Transportation Needs: Not on file  Physical Activity: Not on file  Stress: Not on file  Social Connections: Not on file  Intimate Partner Violence: Not on file     PHYSICAL EXAM  Vitals:   09/04/22 1557  BP: 114/72  Pulse: 81  Weight: 189 lb 8 oz (86 kg)  Height: '5\' 9"'$  (1.753 m)    Body mass index is 27.98 kg/m.  No results found.   General: The patient is well-developed and well-nourished and in no acute distress  HEENT:  Head is [place]/AT.  Sclera are anicteric.     Skin: Extremities are without rash or  edema.  Musculoskeletal: There is tenderness over the occiput/splenius capitis muscles and in the mid cervical paraspinal muscles.  Range of motion was normal.  Neurologic Exam  Mental status: The patient is alert and oriented x 3 at the time of the examination. The patient  has apparent normal recent and remote memory, with an apparently normal attention span and concentration  ability.   Speech is normal.  Cranial nerves: Extraocular movements are full.  Facial strength and sesation were noral No obvious hearing deficits are noted.  Motor:  Muscle bulk is normal.   Tone is normal. Strength is  5 / 5 in all 4 extremities.   Sensory: Sensory testing is intact to vibration sensation in all 4 extremities.  She has allodynia to cold temperature and altered sensation to touch below +/- T7 level.  Coordination: Cerebellar testing reveals good finger-nose-finger and heel-to-shin bilaterally.  Gait and station: Station is normal.   The gait and tandem gait are fairly normal.   Romberg is negative.  Reflexes: Deep tendon reflexes are symmetric and normal bilaterally.       DIAGNOSTIC DATA (LABS, IMAGING, TESTING) - I reviewed patient records, labs, notes, testing and imaging myself where available.  Lab Results  Component Value Date   WBC 7.9 07/11/2022   HGB 14.4 07/11/2022   HCT 42.0 07/11/2022   MCV 91 07/11/2022   PLT 363 07/11/2022      Component Value Date/Time   NA 138 07/11/2022 0809   K 4.5 07/11/2022 0809   CL 101 07/11/2022 0809   CO2 21 07/11/2022 0809   GLUCOSE 85 07/11/2022 0809   GLUCOSE 88 01/01/2021 1025   BUN 8 07/11/2022 0809   CREATININE 0.72 07/11/2022 0809   CALCIUM 10.0 07/11/2022 0809   PROT 7.2 07/11/2022 0809   ALBUMIN 5.0 07/11/2022 0809   AST 13 07/11/2022 0809   ALT 11 07/11/2022 0809   ALKPHOS 72 07/11/2022 0809   BILITOT 0.6 07/11/2022 0809   GFRNONAA >60 01/01/2021 1025   Lab Results  Component Value Date   CHOL 160 07/11/2022   HDL 77 07/11/2022   LDLCALC 70 07/11/2022   TRIG 63 07/11/2022   CHOLHDL 2.1 07/11/2022   Lab Results  Component Value Date   HGBA1C 5.1 07/11/2022   Lab Results  Component Value Date   VITAMINB12 611 07/11/2022   Lab Results  Component Value Date   TSH 1.160 07/11/2022       ASSESSMENT AND PLAN  Multiple sclerosis (HCC)  Migraine without aura and without status  migrainosus, not intractable  Neck pain  Other fatigue   Continue Ocrevus.  Check lab work today.  Her next infusion is in early February 2024 If the headache/neck pain returns we can repeat Bilateral splenius capitus and C5-C6 paraspinal muscle TPI with 80 mg DepoMedrol in [place] using sterile technique.    She never took the Concerta but did fill the prescription and Maitri this if fatigue worsens She will get [place] FMLA paperwork - will need to put in the infusions, office visits, MRI (total 6 days a year) plus 1 day / month for symptoms or exacerbations.   Return in 6 months or sooner if there are new or worsening neurologic symptoms.  Alyissa Whidbee A. Felecia Shelling, MD, [facility] [id number]/[id number], 2:75 PM Certified in Neurology, Clinical Neurophysiology, Sleep Medicine and Neuroimaging  [facility] [address] [place], [place] [address] [phone]

## 2022-09-05 ENCOUNTER — Encounter: Payer: Self-pay | Admitting: Neurology

## 2022-09-05 LAB — IGG, IGA, IGM
IgA/Immunoglobulin A, Serum: 92 mg/dL (ref 87–352)
IgG (Immunoglobin G), Serum: 656 mg/dL (ref 586–1602)
IgM (Immunoglobulin M), Srm: 31 mg/dL (ref 26–217)

## 2022-09-05 LAB — CD20 B CELLS
% CD19-B Cells: 0 % — ABNORMAL LOW (ref 4.6–22.1)
% CD20-B Cells: 0 % — ABNORMAL LOW (ref 5.0–22.3)

## 2022-09-06 ENCOUNTER — Encounter: Payer: Self-pay | Admitting: Nurse Practitioner

## 2022-09-06 ENCOUNTER — Ambulatory Visit: Payer: 59 | Admitting: Nurse Practitioner

## 2022-09-06 ENCOUNTER — Other Ambulatory Visit (HOSPITAL_COMMUNITY): Payer: Self-pay

## 2022-09-06 VITALS — BP 102/72 | HR 92 | Resp 18 | Ht 68.0 in | Wt 188.0 lb

## 2022-09-06 DIAGNOSIS — F411 Generalized anxiety disorder: Secondary | ICD-10-CM

## 2022-09-06 DIAGNOSIS — Z6828 Body mass index (BMI) 28.0-28.9, adult: Secondary | ICD-10-CM | POA: Diagnosis not present

## 2022-09-06 DIAGNOSIS — G35 Multiple sclerosis: Secondary | ICD-10-CM | POA: Diagnosis not present

## 2022-09-06 DIAGNOSIS — Z6826 Body mass index (BMI) 26.0-26.9, adult: Secondary | ICD-10-CM | POA: Insufficient documentation

## 2022-09-06 MED ORDER — SERTRALINE HCL 25 MG PO TABS
25.0000 mg | ORAL_TABLET | Freq: Every day | ORAL | 1 refills | Status: DC
  Filled 2022-09-06: qty 90, 90d supply, fill #0

## 2022-09-06 NOTE — Progress Notes (Signed)
Established patient visit   Patient: Ashley Reed   DOB: 12/01/2000   21 y.o. Female  MRN: [id number] Visit Date: 09/06/2022   Chief Complaint  Patient presents with   Weight Check   Subjective    HPI  Follow up for weight management Initial weight 07/13/2021 - 241 Most recent weight 07/10/2022 - 36 Today's weight 09/06/2022 - 188 Weight loss since most recent visit here 7 pounds  Total weight loss since starting weight management program - 45 pounds   Patient states that she recently stopped taking her birth control  - since then, she has had some pretty significant anxiety  - has had issues with this in the past.    Medications: Outpatient Medications Prior to Visit  Medication Sig   adapalene (DIFFERIN) 0.1 % cream APPLY PEA SIZED AMOUNT TO FINGER AND SPREAD THIN LAYER TO ACNE AREAS ONCE A DAY IN MORNING   albuterol (PROVENTIL HFA;VENTOLIN HFA) 108 (90 Base) MCG/ACT inhaler Inhale 2 puffs 15 minutes prior to exercise and every 4 hours prn cough, wheeze with asthma flare   amoxicillin-clavulanate (AUGMENTIN) 875-125 MG tablet Take 1 tablet by mouth 2 (two) times daily.   B Complex Vitamins (VITAMIN B COMPLEX PO) Take by mouth.   cetirizine (ZYRTEC) 10 MG tablet Take 10 mg by mouth daily.   clindamycin (CLINDAGEL) 1 % gel APPLY A THIN LAYER TO THE AFFECTED AREA(S)  2 TIMES PER DAY   Cyanocobalamin (VITAMIN B-12 PO) Take by mouth.   diclofenac sodium (VOLTAREN) 1 % GEL Apply to large joint area up to three times a day as needed   ergocalciferol (VITAMIN D2) 1.25 MG (50000 UT) capsule Take 1 capsule (50,000 Units total) by mouth once a week.   fluconazole (DIFLUCAN) 150 MG tablet Take 1 tablet by mouth once, may repeat in 3 days if symptoms persist   gabapentin (NEURONTIN) 300 MG capsule Take 1 capsule (300 mg total) by mouth 3 (three) times daily.   Galcanezumab-gnlm (EMGALITY) 120 MG/ML SOAJ Inject 120 mg into the skin every 30 (thirty) days.   ipratropium (ATROVENT) 0.03  % nasal spray Place 2 sprays into both nostrils every 12 (twelve) hours.   linaclotide (LINZESS) 145 MCG CAPS capsule Take 1 capsule (145 mcg total) by mouth daily before breakfast.   meloxicam (MOBIC) 15 MG tablet Take 1 tablet (15 mg total) by mouth daily.   norethindrone-ethinyl estradiol-FE (BLISOVI FE 1/20) 1-20 MG-MCG tablet Take 1 tablet by mouth daily.   ondansetron (ZOFRAN-ODT) 4 MG disintegrating tablet Dissolve 1-2 tablets (4-8 mg total) by mouth every 8 (eight) hours as needed for nausea or vomiting.   prednisoLONE acetate (PRED FORTE) 1 % ophthalmic suspension Place 1 drop into the right eye 4 (four) times daily. (Patient taking differently: Place 1 drop into the right eye 4 (four) times daily as needed.)   rizatriptan (MAXALT-MLT) 10 MG disintegrating tablet Dissolve 1 tablet (10 mg total) by mouth as needed for migraine. May repeat in 2 hours if needed   Semaglutide-Weight Management 1.7 MG/0.75ML SOAJ Inject 1.7 mg into the skin once a week.   tiZANidine (ZANAFLEX) 2 MG tablet Take 1-2 tablets (2-4 mg total) by mouth at bedtime for spasticity   Fluticasone-Salmeterol (ADVAIR) 250-50 MCG/DOSE AEPB INHALE 1 PUFF AS DIRECTED TWICE A DAY   No facility-administered medications prior to visit.    Review of Systems  Constitutional:  Negative for activity change, appetite change, chills, fatigue and fever.       7 pound weight  loss since last visit   HENT:  Negative for congestion, postnasal drip, rhinorrhea, sinus pressure, sinus pain, sneezing and sore throat.   Eyes: Negative.   Respiratory:  Negative for cough, chest tightness, shortness of breath and wheezing.   Cardiovascular:  Negative for chest pain and palpitations.  Gastrointestinal:  Positive for constipation. Negative for abdominal pain, diarrhea, nausea and vomiting.  Endocrine: Negative for cold intolerance, heat intolerance, polydipsia and polyuria.  Genitourinary:  Negative for dyspareunia, dysuria, flank pain,  frequency and urgency.  Musculoskeletal:  Negative for arthralgias, back pain and myalgias.  Skin:  Negative for rash.  Allergic/Immunologic: Negative for environmental allergies.  Neurological:  Negative for dizziness, weakness and headaches.  Hematological:  Negative for adenopathy.  Psychiatric/Behavioral:  The patient is nervous/anxious.        Increased anxiety levels since stopping birth control pills        Objective     Today's Vitals   09/06/22 1334  BP: 102/72  Pulse: 92  Resp: 18  SpO2: 99%  Weight: 188 lb (85.3 kg)  Height: '5\' 8"'$  (1.727 m)   Body mass index is 28.59 kg/m.  BP Readings from Last 3 Encounters:  09/06/22 102/72  09/04/22 114/72  07/10/22 106/68    Wt Readings from Last 3 Encounters:  09/06/22 188 lb (85.3 kg)  09/04/22 189 lb 8 oz (86 kg)  07/10/22 195 lb (88.5 kg)    Physical Exam Vitals and nursing note reviewed.  Constitutional:      Appearance: Normal appearance. She is well-developed.  HENT:     Head: Normocephalic and atraumatic.  Eyes:     Pupils: Pupils are equal, round, and reactive to light.  Cardiovascular:     Rate and Rhythm: Normal rate and regular rhythm.     Pulses: Normal pulses.     Heart sounds: Normal heart sounds.  Pulmonary:     Effort: Pulmonary effort is normal.     Breath sounds: Normal breath sounds.  Abdominal:     Palpations: Abdomen is soft.  Musculoskeletal:        General: Normal range of motion.     Cervical back: Normal range of motion and neck supple.  Lymphadenopathy:     Cervical: No cervical adenopathy.  Skin:    General: Skin is warm and dry.     Capillary Refill: Capillary refill takes less than 2 seconds.  Neurological:     General: No focal deficit present.     Mental Status: She is alert and oriented to person, place, and time.  Psychiatric:        Mood and Affect: Mood normal.        Behavior: Behavior normal.        Thought Content: Thought content normal.        Judgment:  Judgment normal.       Assessment & Plan    1. Generalized anxiety disorder Will start sertraline at 25 mg daily. Reassess in 2 months  - sertraline (ZOLOFT) 25 MG tablet; Take 1 tablet (25 mg total) by mouth daily.  Dispense: 90 tablet; Refill: 1  2. BMI 28.0-28.9,adult Continues to improve with additional 7 pound weight loss since last visit. Continue with low calorie, high-protein diet. Incorporate exercise into daily activities.    3. Multiple sclerosis ([place]) Continue regular visits with neurology and infusion treatments as scheduled.    Problem List Items Addressed This Visit       Nervous and Auditory   Multiple sclerosis (  [place])     Other   Generalized anxiety disorder - Primary   Relevant Medications   sertraline (ZOLOFT) 25 MG tablet   BMI 28.0-28.9,adult     Return in about 2 months (around 11/07/2022) for routine - weight management. started sertraline for anxiety .         Ronnell Freshwater, NP  [facility] [phone] (phone) [phone] (fax)  [place]

## 2022-09-12 ENCOUNTER — Encounter: Payer: Self-pay | Admitting: Neurology

## 2022-09-14 ENCOUNTER — Other Ambulatory Visit: Payer: Self-pay

## 2022-09-14 ENCOUNTER — Other Ambulatory Visit (HOSPITAL_COMMUNITY): Payer: Self-pay

## 2022-09-18 ENCOUNTER — Encounter: Payer: Self-pay | Admitting: Neurology

## 2022-09-18 NOTE — Telephone Encounter (Signed)
Ashley Reed  [facility] Clinical Pool (supporting Richard August Saucer, MD)4 hours ago (10:18 AM)   MF Hello, I am so sorry to keep bothering y'all but I just wanted to make sure my insurance card message was received to start my referral for patient care center and I had fmla faxed over on 12/20 and they are waiting on the medical certificate still but they only give 15 days for it to be submitted.

## 2022-09-18 NOTE — Telephone Encounter (Signed)
Note sent to pod 1 calls.

## 2022-09-19 ENCOUNTER — Telehealth: Payer: Self-pay | Admitting: *Deleted

## 2022-09-19 ENCOUNTER — Other Ambulatory Visit: Payer: Self-pay | Admitting: *Deleted

## 2022-09-19 DIAGNOSIS — Z0289 Encounter for other administrative examinations: Secondary | ICD-10-CM

## 2022-09-19 DIAGNOSIS — G35 Multiple sclerosis: Secondary | ICD-10-CM

## 2022-09-19 NOTE — Telephone Encounter (Signed)
PA Ocrevus submitted on covermymeds. Key: [id number]. Waiting on determination from [place].

## 2022-09-19 NOTE — Telephone Encounter (Signed)
Pt [employer] form on nurse desk. Pd 09/19/2022

## 2022-09-19 NOTE — Telephone Encounter (Signed)
Called [employer] at [phone] because we received fax that PA approved, however, provider listed as [facility]. Spoke w/ Junie Panning. She updated provider to be [facility]. NPI: [id number]. Tax ID: [id number]. Address: [address] [place], [place], [place] [address]. Phone: [phone]. They will resend approval letter with updated information.

## 2022-09-20 ENCOUNTER — Other Ambulatory Visit (HOSPITAL_COMMUNITY): Payer: Self-pay | Admitting: *Deleted

## 2022-09-20 ENCOUNTER — Other Ambulatory Visit (HOSPITAL_COMMUNITY): Payer: Self-pay

## 2022-09-21 ENCOUNTER — Other Ambulatory Visit (HOSPITAL_COMMUNITY): Payer: Self-pay

## 2022-09-22 ENCOUNTER — Other Ambulatory Visit (HOSPITAL_COMMUNITY): Payer: Self-pay

## 2022-09-24 ENCOUNTER — Other Ambulatory Visit (HOSPITAL_COMMUNITY): Payer: Self-pay

## 2022-09-24 NOTE — Telephone Encounter (Signed)
We received the papers on Jan 3. We have 10-14 business days to complete forms. I will do my best to get them completed asap.

## 2022-09-24 NOTE — Telephone Encounter (Signed)
Pt has called re: the status of the form being completed.  Pt states this is time sensitive, it needs to be completed/submitted back by 09-25-22 please call with status

## 2022-09-25 ENCOUNTER — Telehealth: Payer: Self-pay | Admitting: *Deleted

## 2022-09-25 ENCOUNTER — Other Ambulatory Visit (HOSPITAL_COMMUNITY): Payer: Self-pay

## 2022-09-25 NOTE — Telephone Encounter (Signed)
Pt fmla form faxed to day to [phone]

## 2022-09-25 NOTE — Telephone Encounter (Signed)
Dr. Felecia Shelling signed the forms. Given to Hilda Blades in MR.

## 2022-09-26 ENCOUNTER — Other Ambulatory Visit (HOSPITAL_COMMUNITY): Payer: Self-pay

## 2022-10-01 ENCOUNTER — Other Ambulatory Visit (HOSPITAL_COMMUNITY): Payer: Self-pay

## 2022-10-02 ENCOUNTER — Other Ambulatory Visit: Payer: Self-pay

## 2022-10-03 ENCOUNTER — Other Ambulatory Visit: Payer: Self-pay

## 2022-10-03 ENCOUNTER — Other Ambulatory Visit: Payer: Self-pay | Admitting: Neurology

## 2022-10-03 ENCOUNTER — Other Ambulatory Visit (HOSPITAL_COMMUNITY): Payer: Self-pay

## 2022-10-03 MED ORDER — GABAPENTIN 300 MG PO CAPS
300.0000 mg | ORAL_CAPSULE | Freq: Three times a day (TID) | ORAL | 2 refills | Status: DC
  Filled 2022-10-03: qty 90, 30d supply, fill #0

## 2022-10-03 NOTE — Telephone Encounter (Signed)
Last office visit with Dr.Sater 09/04/2022 Per last note patient taking Emgality

## 2022-10-05 ENCOUNTER — Other Ambulatory Visit (HOSPITAL_COMMUNITY): Payer: Self-pay

## 2022-10-08 ENCOUNTER — Other Ambulatory Visit (HOSPITAL_COMMUNITY): Payer: Self-pay

## 2022-10-08 ENCOUNTER — Telehealth: Payer: Self-pay | Admitting: Neurology

## 2022-10-08 ENCOUNTER — Other Ambulatory Visit: Payer: Self-pay | Admitting: Neurology

## 2022-10-08 MED ORDER — RIZATRIPTAN BENZOATE 10 MG PO TBDP
10.0000 mg | ORAL_TABLET | ORAL | 7 refills | Status: AC | PRN
  Filled 2022-10-08: qty 9, 30d supply, fill #0
  Filled 2022-11-26: qty 9, 30d supply, fill #1
  Filled 2023-01-01: qty 9, 30d supply, fill #2

## 2022-10-08 NOTE — Telephone Encounter (Signed)
Terrence Dupont, RN confirmed patient use both medications. Rx sent Follow up scheduled on 12/19/22

## 2022-10-08 NOTE — Telephone Encounter (Signed)
Pt would like a call from the nurse to discuss why Dr. Felecia Shelling refused refill request for rizatriptan (MAXALT-MLT) 10 MG disintegrating tablet.

## 2022-10-08 NOTE — Telephone Encounter (Signed)
Dr.Sater it appears patient has been using Emgality and Maxalt for migraines. She reports this has been the best management for her. She is requesting refill on Maxalt 10 mg tablet. Okay for patient to use both medications?  Please advise

## 2022-10-15 ENCOUNTER — Ambulatory Visit (INDEPENDENT_AMBULATORY_CARE_PROVIDER_SITE_OTHER): Payer: Commercial Managed Care - PPO

## 2022-10-15 DIAGNOSIS — Z3202 Encounter for pregnancy test, result negative: Secondary | ICD-10-CM | POA: Diagnosis not present

## 2022-10-15 DIAGNOSIS — Z3200 Encounter for pregnancy test, result unknown: Secondary | ICD-10-CM

## 2022-10-15 LAB — POCT PREGNANCY, URINE: Preg Test, Ur: NEGATIVE

## 2022-10-16 NOTE — Progress Notes (Signed)
Possible Pregnancy  Here today for possible pregnancy. Not currently using contraception method. Reports unprotected intercourse 10/05/22. LMP 10/09/22. However, patient reports period was earlier than expected and she has had nausea. Does not desire pregnancy. UPT today in office is negative. Recommended pt take home UPT 14 days from sexual encounter. Declines contraceptive method at this time.  Annabell Howells, RN 10/16/2022  3:35 PM

## 2022-10-18 ENCOUNTER — Non-Acute Institutional Stay (HOSPITAL_COMMUNITY)
Admission: RE | Admit: 2022-10-18 | Discharge: 2022-10-18 | Disposition: A | Discharge status: Home / Self Care | Payer: Commercial Managed Care - PPO | Source: Ambulatory Visit | Attending: Internal Medicine | Admitting: Internal Medicine

## 2022-10-18 ENCOUNTER — Telehealth: Payer: Self-pay | Admitting: Neurology

## 2022-10-18 DIAGNOSIS — G35 Multiple sclerosis: Secondary | ICD-10-CM

## 2022-10-18 MED ORDER — METHYLPREDNISOLONE SODIUM SUCC 125 MG IJ SOLR
125.0000 mg | Freq: Once | INTRAMUSCULAR | Status: AC
  Administered 2022-10-18: 125 mg via INTRAVENOUS
  Filled 2022-10-18: qty 2

## 2022-10-18 MED ORDER — DIPHENHYDRAMINE HCL 50 MG/ML IJ SOLN
50.0000 mg | Freq: Once | INTRAMUSCULAR | Status: AC | PRN
  Administered 2022-10-18: 50 mg via INTRAVENOUS
  Filled 2022-10-18: qty 1

## 2022-10-18 MED ORDER — SODIUM CHLORIDE 0.9 % IV SOLN: INTRAVENOUS | Status: DC | PRN

## 2022-10-18 MED ORDER — SODIUM CHLORIDE 0.9 % IV SOLN
600.0000 mg | INTRAVENOUS | Status: DC
  Administered 2022-10-18: 600 mg via INTRAVENOUS
  Filled 2022-10-18: qty 20

## 2022-10-18 MED ORDER — FAMOTIDINE IN NACL 20-0.9 MG/50ML-% IV SOLN
20.0000 mg | Freq: Once | INTRAVENOUS | Status: AC
  Administered 2022-10-18: 20 mg via INTRAVENOUS
  Filled 2022-10-18: qty 50

## 2022-10-18 MED ORDER — ACETAMINOPHEN 325 MG PO TABS
650.0000 mg | ORAL_TABLET | Freq: Once | ORAL | Status: AC
  Administered 2022-10-18: 650 mg via ORAL
  Filled 2022-10-18: qty 2

## 2022-10-18 MED ORDER — DIPHENHYDRAMINE HCL 50 MG/ML IJ SOLN
50.0000 mg | Freq: Once | INTRAMUSCULAR | Status: AC
  Administered 2022-10-18: 50 mg via INTRAVENOUS
  Filled 2022-10-18: qty 1

## 2022-10-18 NOTE — Telephone Encounter (Addendum)
I messaged Ashley Reed back via epic chat. Advised Dr. Felecia Shelling ok with adding on benadryl '50mg'$  prn IV

## 2022-10-18 NOTE — Telephone Encounter (Signed)
Ashley Blanks, RN @ [name] states pt complains of irritation of throat while having Ocrevus, they are asking for an order of IV benadryl, please call

## 2022-10-18 NOTE — Progress Notes (Signed)
PATIENT CARE CENTER NOTE  Diagnosis:    Provider: Arlice Colt  Procedure: Ocrevus 600 mg  Note: Patient received Ocrevus infusion (dose #1 of 2) via PIV. Pt pre-medicated with PO Tylenol 650 mg, IV Benadryl 50 mg, IV Solumedrol 125 mg, and IVPB Famotidine 20 mg. Prior to infusion, pt stated that in the past during this infusion, she has irritation of her throat that is resolved with an additional dose of IV Benadryl. RN reached out to [facility] to get an prn IV Benadryl order. RN spoke with Terrence Dupont, RN who per ordering provider said it is ok to give 1 x prn dose of 50 mg IV Benadryl if the pt needs it during infusion. During the infusion, the pt called out and said that her throat was itchy. Infusion stopped, and vital signs taken. Pts vital signs stable. PRN dose of IV Benadryl 50 mg given. Pt monitored, and pt stated that symptoms had completely resolved. Infusion restarted. Pt tolerated the duration of the infusion with no issues. At end of infusion, pt complained of IV hurting her. Difficulty flushing and pt reported it being painful. IV removed. Infusion had 26.2 mLs left until complete. New IV access was unsuccessful after multiple attempts. Patient advised to schedule next appointment for 6 months at the front desk. Pt alert, oriented, and ambulatory at discharge. Pt discharged home with her mom.

## 2022-10-27 ENCOUNTER — Other Ambulatory Visit: Payer: Self-pay | Admitting: Nurse Practitioner

## 2022-10-27 DIAGNOSIS — R110 Nausea: Secondary | ICD-10-CM

## 2022-10-29 ENCOUNTER — Other Ambulatory Visit (HOSPITAL_COMMUNITY): Payer: Self-pay

## 2022-10-29 MED ORDER — ONDANSETRON 4 MG PO TBDP
4.0000 mg | ORAL_TABLET | Freq: Three times a day (TID) | ORAL | 1 refills | Status: DC | PRN
  Filled 2022-10-29: qty 30, 5d supply, fill #0

## 2022-10-29 NOTE — Telephone Encounter (Signed)
L.O.V: 09/06/22  N.O.V: 11/07/22  L.R.F: 04/25/22 Zofran 30 tab 1 refill

## 2022-10-30 ENCOUNTER — Other Ambulatory Visit (HOSPITAL_COMMUNITY): Payer: Self-pay

## 2022-10-30 ENCOUNTER — Other Ambulatory Visit: Payer: Self-pay | Admitting: Nurse Practitioner

## 2022-10-30 ENCOUNTER — Other Ambulatory Visit: Payer: Self-pay

## 2022-10-30 ENCOUNTER — Encounter: Payer: Self-pay | Admitting: Neurology

## 2022-10-30 DIAGNOSIS — Z6829 Body mass index (BMI) 29.0-29.9, adult: Secondary | ICD-10-CM

## 2022-10-30 DIAGNOSIS — G35 Multiple sclerosis: Secondary | ICD-10-CM

## 2022-10-30 MED ORDER — WEGOVY 1.7 MG/0.75ML ~~LOC~~ SOAJ
1.7000 mg | SUBCUTANEOUS | 2 refills | Status: DC
  Filled 2022-10-30: qty 3, 28d supply, fill #0
  Filled 2022-11-14: qty 3, 28d supply, fill #1
  Filled 2022-12-18: qty 3, 28d supply, fill #2

## 2022-10-31 ENCOUNTER — Telehealth: Payer: Self-pay | Admitting: Neurology

## 2022-10-31 ENCOUNTER — Encounter: Payer: Self-pay | Admitting: Neurology

## 2022-10-31 NOTE — Telephone Encounter (Signed)
scheduled 3/11 830am

## 2022-10-31 NOTE — Telephone Encounter (Signed)
[employer] NPR sent to [facility] [phone]

## 2022-11-01 ENCOUNTER — Ambulatory Visit (INDEPENDENT_AMBULATORY_CARE_PROVIDER_SITE_OTHER): Payer: Commercial Managed Care - PPO | Admitting: Neurology

## 2022-11-01 ENCOUNTER — Other Ambulatory Visit (HOSPITAL_COMMUNITY): Payer: Self-pay

## 2022-11-01 ENCOUNTER — Encounter: Payer: Self-pay | Admitting: Nurse Practitioner

## 2022-11-01 DIAGNOSIS — G4489 Other headache syndrome: Secondary | ICD-10-CM

## 2022-11-01 DIAGNOSIS — G35 Multiple sclerosis: Secondary | ICD-10-CM | POA: Diagnosis not present

## 2022-11-01 DIAGNOSIS — M542 Cervicalgia: Secondary | ICD-10-CM | POA: Diagnosis not present

## 2022-11-01 MED ORDER — ALPRAZOLAM 0.5 MG PO TABS
1.0000 mg | ORAL_TABLET | ORAL | 0 refills | Status: DC
  Filled 2022-11-01: qty 3, 1d supply, fill #0

## 2022-11-01 NOTE — Progress Notes (Addendum)
[facility]  PATIENT: Ashley Reed DOB: 2001/01/03  REFERRING DOCTOR OR PCP:  Nathaneil Canary, MD SOURCE: Patient, notes from [facility], imaging and lab results, MRI images personally reviewed  _________________________________   HISTORICAL  CHIEF COMPLAINT:  Chief Complaint  Patient presents with   Follow-up    Pt in room #11 with her mother. Pt here today for f/u on her MS.    Ashley, Reed a 22 y.o. woman with multiple sclerosis and headaches.  Update 11/01/2022: She had pain that started behind the right eye yesterday and is now right > left occipital.   In the past similar pain improved with a TPI in the past.   Her current headache is moderate but it was more severe yesterday..  She takes tizanidine 4 mg nightly.   She continues on [employer].    Her MS is stable.  Her first Ocrevus infusions were July 2022   Sometimes she gets a tight sensation in her throat but Benadryl seems to help.      The gait is stable.  Balance is slightly off but unchanged compared to her last visit.   She will sometimes use the banister on stairs.  Her legs tingle at times, especially if she walks long distances.  She sometimes gets facial numbness.   She sometimes drops items out of hands.   She has more urinary dysfunction.   She has hesitancy and mild trouble with incomplete emptying.   Flomax was not tolerated and did not help her much during the couple days that she took it.        Cognition is the same with occasional memory issues.  .   Modafinil helped fatigue some but she stopped due to interaction with OCP.   She sleeps well at night and sometimes sleeps > 12 hours.  She does not doze off during the day much.   She denies mood issues.     She has had lower back pain x many years.   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   She has had LBP and occasional leg pain.   A few times she has had her back  lock up due to muscle spasms.    This improved with muscle relaxant.    MS HISTORY: She is a 22 year old woman who had numbness from the waist down lasting 1 month in January and February.    She did not have any balance issues.   She also had mild urinary retention.   In April, she had the onset of numbness form the breast down and also had poor balance.   With symptoms persisting she saw the ED at [facility] break but MRI was not up.   She returned to school at [employer] and had an MRI performed 01/06/2021.  This was about 3 weeks after the onset of symptoms.   She was admitted to [facility] and did 7 days of IV Solu-medrol.  Symptoms improved.    She saw Dr. Concha Pyo.   She was started on Ocrevus.  Her last infusion was March 30, 2021.  She noticed issues with stomach pain, nausea lasting a couple weeks from the infusion.  Additionally she had some hair loss since the last infusion.   Imaging personally reviewed (and shown to patient): MRI of the brain 01/06/2021 shows multiple T2/FLAIR hyperintense foci, predominantly in the periventricular white matter including the callosal septal fibers.  1 focus is  also in the frontal juxtacortical white matter on the left.  None of the foci enhance.    Cervical, thoracic and lumbar spine MRI 01/06/2021 showed a large T2 hyperintense field is adjacent to T6.  It showed faint enhancement degenerative changes are noted at L3-L4, L4-L5 and L5-S1.[name]  There is spinal stenosis at L3-L4 .      Pertinent laboratory tests: 01/07/2021: CSF showed 3 oligoclonal bands.  Myelin basic protein was elevated. 02/10/2021: Hepatitis panel was negative.  Vitamin D was mildly low.  TSH was normal REVIEW OF SYSTEMS: Constitutional: No fevers, chills, sweats, or change in appetite Eyes: No visual changes, double vision, eye pain Ear, nose and throat: No hearing loss, ear pain, nasal congestion, sore throat Cardiovascular: No chest pain, palpitations Respiratory:  No shortness of breath at rest  or with exertion.   No wheezes GastrointestinaI: No nausea, vomiting, diarrhea, abdominal pain, fecal incontinence Genitourinary:  No dysuria, urinary retention or frequency.  No nocturia. Musculoskeletal:  No neck pain, back pain Integumentary: No rash, pruritus, skin lesions Neurological: as above Psychiatric: No depression at this time.  No anxiety Endocrine: No palpitations, diaphoresis, change in appetite, change in weigh or increased thirst Hematologic/Lymphatic:  No anemia, purpura, petechiae. Allergic/Immunologic: No itchy/runny eyes, nasal congestion, recent allergic reactions, rashes  ALLERGIES: No Known Allergies  HOME MEDICATIONS:  Current Outpatient Medications:    adapalene (DIFFERIN) 0.1 % cream, APPLY PEA SIZED AMOUNT TO FINGER AND SPREAD THIN LAYER TO ACNE AREAS ONCE A DAY IN MORNING, Disp: 45 g, Rfl: 1   albuterol (PROVENTIL HFA;VENTOLIN HFA) 108 (90 Base) MCG/ACT inhaler, Inhale 2 puffs 15 minutes prior to exercise and every 4 hours prn cough, wheeze with asthma flare, Disp: , Rfl:    amoxicillin-clavulanate (AUGMENTIN) 875-125 MG tablet, Take 1 tablet by mouth 2 (two) times daily., Disp: 14 tablet, Rfl: 0   B Complex Vitamins (VITAMIN B COMPLEX PO), Take by mouth., Disp: , Rfl:    cetirizine (ZYRTEC) 10 MG tablet, Take 10 mg by mouth daily., Disp: , Rfl:    clindamycin (CLINDAGEL) 1 % gel, APPLY A THIN LAYER TO THE AFFECTED AREA(S)  2 TIMES PER DAY, Disp: 30 g, Rfl: 0   Cyanocobalamin (VITAMIN B-12 PO), Take by mouth., Disp: , Rfl:    diclofenac sodium (VOLTAREN) 1 % GEL, Apply to large joint area up to three times a day as needed, Disp: 2 Tube, Rfl: 1   ergocalciferol (VITAMIN D2) 1.25 MG (50000 UT) capsule, Take 1 capsule (50,000 Units total) by mouth once a week., Disp: 12 capsule, Rfl: 1   fluconazole (DIFLUCAN) 150 MG tablet, Take 1 tablet by mouth once, may repeat in 3 days if symptoms persist, Disp: 3 tablet, Rfl: 0   gabapentin (NEURONTIN) 300 MG capsule, Take 1  capsule (300 mg total) by mouth 3 (three) times daily., Disp: 90 capsule, Rfl: 0   Galcanezumab-gnlm (EMGALITY) 120 MG/ML SOAJ, Inject 120 mg into the skin every 30 (thirty) days., Disp: 1 mL, Rfl: 11   ipratropium (ATROVENT) 0.03 % nasal spray, Place 2 sprays into both nostrils every 12 (twelve) hours., Disp: 30 mL, Rfl: 0   linaclotide (LINZESS) 145 MCG CAPS capsule, Take 1 capsule (145 mcg total) by mouth daily before breakfast., Disp: 90 capsule, Rfl: 1   meloxicam (MOBIC) 15 MG tablet, Take 1 tablet (15 mg total) by mouth daily., Disp: 30 tablet, Rfl: 3   norethindrone-ethinyl estradiol-FE (BLISOVI FE 1/20) 1-20 MG-MCG tablet, Take 1 tablet by mouth daily., Disp: 10  tablet, Rfl: 2   ondansetron (ZOFRAN-ODT) 4 MG disintegrating tablet, Dissolve 1-2 tablets (4-8 mg total) by mouth every 8 (eight) hours as needed for nausea or vomiting., Disp: 30 tablet, Rfl: 1   prednisoLONE acetate (PRED FORTE) 1 % ophthalmic suspension, Place 1 drop into the right eye 4 (four) times daily. (Patient taking differently: Place 1 drop into the right eye 4 (four) times daily as needed.), Disp: 10 mL, Rfl: 0   rizatriptan (MAXALT-MLT) 10 MG disintegrating tablet, Dissolve 1 tablet (10 mg total) by mouth as needed for migraine. May repeat in 2 hours if needed, Disp: 9 tablet, Rfl: 7   Semaglutide-Weight Management 1.7 MG/0.75ML SOAJ, Inject 1.7 mg into the skin once a week., Disp: 3 mL, Rfl: 2   tiZANidine (ZANAFLEX) 2 MG tablet, Take 1-2 tablets (2-4 mg total) by mouth at bedtime for spasticity, Disp: 60 tablet, Rfl: 11   Fluticasone-Salmeterol (ADVAIR) 250-50 MCG/DOSE AEPB, INHALE 1 PUFF AS DIRECTED TWICE A DAY, Disp: 60 each, Rfl: 2  PAST MEDICAL HISTORY: Past Medical History:  Diagnosis Date   Asthma    MS (multiple sclerosis) ([place]) 01/07/2021    PAST SURGICAL HISTORY: Past Surgical History:  Procedure Laterality Date   BREAST REDUCTION SURGERY     WISDOM TOOTH EXTRACTION      FAMILY HISTORY: Family  History  Problem Relation Age of Onset   Fibromyalgia Mother    Bipolar disorder Father    Asthma Brother    Asthma Brother    Asthma Brother    Mental illness Brother     SOCIAL HISTORY:  Social History   Socioeconomic History   Marital status: Single    Spouse name: Not on file   Number of children: Not on file   Years of education: Not on file   Highest education level: Bachelor's degree (e.g., BA, AB, BS)  Occupational History   Not on file  Tobacco Use   Smoking status: Never   Smokeless tobacco: Never  Vaping Use   Vaping Use: Never used  Substance and Sexual Activity   Alcohol use: Yes    Comment: on occasion   Drug use: Not Currently   Sexual activity: Not on file  Other Topics Concern   Not on file  Social History Narrative   Lives with mother   Right handed   Caffeine: 1-2 coffee in a week   Social Determinants of Health   Financial Resource Strain: Not on file  Food Insecurity: Not on file  Transportation Needs: Not on file  Physical Activity: Not on file  Stress: Not on file  Social Connections: Not on file  Intimate Partner Violence: Not on file     PHYSICAL EXAM  Vitals:   09/04/22 1557  BP: 114/72  Pulse: 81  Weight: 189 lb 8 oz (86 kg)  Height: 5' 9"$  (1.753 m)    Body mass index is 27.98 kg/m.  No results found.   General: The patient is well-developed and well-nourished and in no acute distress  HEENT:  Head is /AT.  Sclera are anicteric.     Skin: Extremities are without rash or  edema.  Musculoskeletal: There is tenderness over the occiput/splenius capitis muscles and in the mid cervical paraspinal muscles.  Range of motion was normal.  Neurologic Exam  Mental status: The patient is alert and oriented x 3 at the time of the examination. The patient has apparent normal recent and remote memory, with an apparently normal attention span and concentration ability.  Speech is normal.  Cranial nerves: Extraocular movements  are full.  Facial strength and sesation were noral No obvious hearing deficits are noted.  Motor:  Muscle bulk is normal.   Tone is normal. Strength is  5 / 5 in all 4 extremities.   Sensory: Sensory testing is intact to vibration sensation in all 4 extremities.  She has allodynia to cold temperature and altered sensation to touch below +/- T7 level.  Coordination: Cerebellar testing reveals good finger-nose-finger and heel-to-shin bilaterally.  Gait and station: Station is normal.   The gait and tandem gait are fairly normal.   There is negative. Reflexes: Deep tendon reflexes are symmetric and normal bilaterally.       DIAGNOSTIC DATA (LABS, IMAGING, TESTING) - I reviewed patient records, labs, notes, testing and imaging myself where available.  Lab Results  Component Value Date   WBC 7.9 07/11/2022   HGB 14.4 07/11/2022   HCT 42.0 07/11/2022   MCV 91 07/11/2022   PLT 363 07/11/2022      Component Value Date/Time   NA 138 07/11/2022 0809   K 4.5 07/11/2022 0809   CL 101 07/11/2022 0809   CO2 21 07/11/2022 0809   GLUCOSE 85 07/11/2022 0809   GLUCOSE 88 01/01/2021 1025   BUN 8 07/11/2022 0809   CREATININE 0.72 07/11/2022 0809   CALCIUM 10.0 07/11/2022 0809   PROT 7.2 07/11/2022 0809   ALBUMIN 5.0 07/11/2022 0809   AST 13 07/11/2022 0809   ALT 11 07/11/2022 0809   ALKPHOS 72 07/11/2022 0809   BILITOT 0.6 07/11/2022 0809   GFRNONAA >60 01/01/2021 1025   Lab Results  Component Value Date   CHOL 160 07/11/2022   HDL 77 07/11/2022   LDLCALC 70 07/11/2022   TRIG 63 07/11/2022   CHOLHDL 2.1 07/11/2022   Lab Results  Component Value Date   HGBA1C 5.1 07/11/2022   Lab Results  Component Value Date   VITAMINB12 611 07/11/2022   Lab Results  Component Value Date   TSH 1.160 07/11/2022       ASSESSMENT AND PLAN  Multiple sclerosis (HCC)  Migraine without aura and without status migrainosus, not intractable  Neck pain  Other fatigue   for the  headache/neck pain: Bilateral splenius capitus and C5-C6 paraspinal muscle TPI with 80 mg DepoMedrol in [place] using sterile technique.   She tolerated the procedure well and there were no complications.  . For MS continue Ocrevus.  Check lab work today.  Her next infusion is in early February 2024 She would like to switch the MRI to [facility] as in network.  I will reorder.   Return in 6 months or sooner if there are new or worsening neurologic symptoms.  Bethanie Bloxom A. Felecia Shelling, MD, [facility] [id number], [id number] PM Certified in Neurology, Clinical Neurophysiology, Sleep Medicine and Neuroimaging  [facility] [address], [place], [place] [address] [phone]

## 2022-11-02 ENCOUNTER — Other Ambulatory Visit (HOSPITAL_COMMUNITY): Payer: Self-pay

## 2022-11-07 ENCOUNTER — Ambulatory Visit (INDEPENDENT_AMBULATORY_CARE_PROVIDER_SITE_OTHER): Payer: Commercial Managed Care - PPO | Admitting: Nurse Practitioner

## 2022-11-07 ENCOUNTER — Encounter: Payer: Self-pay | Admitting: Nurse Practitioner

## 2022-11-07 ENCOUNTER — Other Ambulatory Visit (HOSPITAL_COMMUNITY): Payer: Self-pay

## 2022-11-07 VITALS — BP 98/65 | HR 65 | Ht 68.0 in | Wt 184.8 lb

## 2022-11-07 DIAGNOSIS — Z6828 Body mass index (BMI) 28.0-28.9, adult: Secondary | ICD-10-CM

## 2022-11-07 DIAGNOSIS — G35 Multiple sclerosis: Secondary | ICD-10-CM | POA: Diagnosis not present

## 2022-11-07 DIAGNOSIS — J4520 Mild intermittent asthma, uncomplicated: Secondary | ICD-10-CM | POA: Diagnosis not present

## 2022-11-07 MED ORDER — FLUTICASONE-SALMETEROL 250-50 MCG/ACT IN AEPB
1.0000 | INHALATION_SPRAY | Freq: Two times a day (BID) | RESPIRATORY_TRACT | 5 refills | Status: AC
  Filled 2022-11-07: qty 60, 30d supply, fill #0

## 2022-11-07 MED ORDER — ALBUTEROL SULFATE HFA 108 (90 BASE) MCG/ACT IN AERS
2.0000 | INHALATION_SPRAY | Freq: Four times a day (QID) | RESPIRATORY_TRACT | 5 refills | Status: AC | PRN
  Filled 2022-11-07: qty 6.7, 25d supply, fill #0

## 2022-11-07 NOTE — Progress Notes (Signed)
Established patient visit   Patient: Ashley Reed   DOB: 09-21-2000   22 y.o. Female  MRN: [id number] Visit Date: 11/07/2022   Chief Complaint  Patient presents with   Medical Management of Chronic Issues   Subjective    HPI  Follow up for weight management Initial weight 07/13/2021 - 241 Most recent weight 09/06/2022 - 188 Today's weight 11/07/2022 - 184 pounds  Weight loss since most recent visit here 4 pounds  Total weight loss since starting weight management program - 50 pounds   Does have seasonal allergies which often arise in asthma flares.  -needs to have new prescriptions for Advair and albuterol.    Medications: Outpatient Medications Prior to Visit  Medication Sig   adapalene (DIFFERIN) 0.1 % cream APPLY PEA SIZED AMOUNT TO FINGER AND SPREAD THIN LAYER TO ACNE AREAS ONCE A DAY IN MORNING   ALPRAZolam (XANAX) 0.5 MG tablet Take 2-3 tablets (1-1.5 mg total) by mouth as directed before MRI.   amoxicillin-clavulanate (AUGMENTIN) 875-125 MG tablet Take 1 tablet by mouth 2 (two) times daily.   B Complex Vitamins (VITAMIN B COMPLEX PO) Take by mouth.   cetirizine (ZYRTEC) 10 MG tablet Take 10 mg by mouth daily.   clindamycin (CLINDAGEL) 1 % gel APPLY A THIN LAYER TO THE AFFECTED AREA(S)  2 TIMES PER DAY   Cyanocobalamin (VITAMIN B-12 PO) Take by mouth.   diclofenac sodium (VOLTAREN) 1 % GEL Apply to large joint area up to three times a day as needed   ergocalciferol (VITAMIN D2) 1.25 MG (50000 UT) capsule Take 1 capsule (50,000 Units total) by mouth once a week.   fluconazole (DIFLUCAN) 150 MG tablet Take 1 tablet by mouth once, may repeat in 3 days if symptoms persist   gabapentin (NEURONTIN) 300 MG capsule Take 1 capsule (300 mg total) by mouth 3 (three) times daily.   Galcanezumab-gnlm (EMGALITY) 120 MG/ML SOAJ Inject 120 mg into the skin every 30 (thirty) days.   ipratropium (ATROVENT) 0.03 % nasal spray Place 2 sprays into both nostrils every 12 (twelve) hours.    linaclotide (LINZESS) 145 MCG CAPS capsule Take 1 capsule (145 mcg total) by mouth daily before breakfast.   meloxicam (MOBIC) 15 MG tablet Take 1 tablet (15 mg total) by mouth daily.   norethindrone-ethinyl estradiol-FE (BLISOVI FE 1/20) 1-20 MG-MCG tablet Take 1 tablet by mouth daily.   ondansetron (ZOFRAN-ODT) 4 MG disintegrating tablet Dissolve 1-2 tablets (4-8 mg total) by mouth every 8 (eight) hours as needed for nausea or vomiting.   prednisoLONE acetate (PRED FORTE) 1 % ophthalmic suspension Place 1 drop into the right eye 4 (four) times daily. (Patient taking differently: Place 1 drop into the right eye 4 (four) times daily as needed.)   rizatriptan (MAXALT-MLT) 10 MG disintegrating tablet Dissolve 1 tablet (10 mg total) by mouth as needed for migraine. May repeat in 2 hours if needed   Semaglutide-Weight Management (WEGOVY) 1.7 MG/0.75ML SOAJ Inject 1.7 mg into the skin once a week.   tiZANidine (ZANAFLEX) 2 MG tablet Take 1-2 tablets (2-4 mg total) by mouth at bedtime for spasticity   [DISCONTINUED] albuterol (PROVENTIL HFA;VENTOLIN HFA) 108 (90 Base) MCG/ACT inhaler Inhale 2 puffs 15 minutes prior to exercise and every 4 hours prn cough, wheeze with asthma flare   [DISCONTINUED] Fluticasone-Salmeterol (ADVAIR) 250-50 MCG/DOSE AEPB INHALE 1 PUFF AS DIRECTED TWICE A DAY   No facility-administered medications prior to visit.    Review of Systems See HPI    Last  CBC Lab Results  Component Value Date   WBC 7.9 07/11/2022   HGB 14.4 07/11/2022   HCT 42.0 07/11/2022   MCV 91 07/11/2022   MCH 31.3 07/11/2022   RDW 12.2 07/11/2022   PLT 363 [id number]   Last metabolic panel Lab Results  Component Value Date   GLUCOSE 85 07/11/2022   NA 138 07/11/2022   K 4.5 07/11/2022   CL 101 07/11/2022   CO2 21 07/11/2022   BUN 8 07/11/2022   CREATININE 0.72 07/11/2022   EGFR 120 07/11/2022   CALCIUM 10.0 07/11/2022   PROT 7.2 07/11/2022   ALBUMIN 5.0 07/11/2022   LABGLOB 2.2  07/11/2022   AGRATIO 2.3 (H) 07/11/2022   BILITOT 0.6 07/11/2022   ALKPHOS 72 07/11/2022   AST 13 07/11/2022   ALT 11 07/11/2022   ANIONGAP 8 01/01/2021   Last lipids Lab Results  Component Value Date   CHOL 160 07/11/2022   HDL 77 07/11/2022   LDLCALC 70 07/11/2022   TRIG 63 07/11/2022   CHOLHDL 2.1 07/11/2022   Last hemoglobin A1c Lab Results  Component Value Date   HGBA1C 5.1 07/11/2022   Last thyroid functions Lab Results  Component Value Date   TSH 1.160 07/11/2022   Last vitamin D Lab Results  Component Value Date   VD25OH 69.6 07/11/2022   Last vitamin B12 and Folate Lab Results  Component Value Date   VITAMINB12 611 07/11/2022   FOLATE >20.0 07/11/2022       Objective     Today's Vitals   11/07/22 1556  BP: 98/65  Pulse: 65  SpO2: 100%  Weight: 184 lb 12.8 oz (83.8 kg)  Height: 5\' 8"  (1.727 m)   Body mass index is 28.1 kg/m.  BP Readings from Last 3 Encounters:  11/07/22 98/65  10/18/22 (Abnormal) 105/55  09/06/22 102/72    Wt Readings from Last 3 Encounters:  11/07/22 184 lb 12.8 oz (83.8 kg)  09/06/22 188 lb (85.3 kg)  09/04/22 189 lb 8 oz (86 kg)    Physical Exam Vitals and nursing note reviewed.  Constitutional:      Appearance: Normal appearance. She is well-developed.  HENT:     Head: Normocephalic and atraumatic.     Nose: Nose normal.     Mouth/Throat:     Mouth: Mucous membranes are moist.     Pharynx: Oropharynx is clear.  Eyes:     Extraocular Movements: Extraocular movements intact.     Conjunctiva/sclera: Conjunctivae normal.     Pupils: Pupils are equal, round, and reactive to light.  Neck:     Vascular: No carotid bruit.  Cardiovascular:     Rate and Rhythm: Normal rate and regular rhythm.     Pulses: Normal pulses.     Heart sounds: Normal heart sounds.  Pulmonary:     Effort: Pulmonary effort is normal.     Breath sounds: Normal breath sounds.  Abdominal:     Palpations: Abdomen is soft.   Musculoskeletal:        General: Normal range of motion.     Cervical back: Normal range of motion and neck supple.  Lymphadenopathy:     Cervical: No cervical adenopathy.  Skin:    General: Skin is warm and dry.     Capillary Refill: Capillary refill takes less than 2 seconds.  Neurological:     General: No focal deficit present.     Mental Status: She is alert and oriented to person, place, and time.  Psychiatric:        Mood and Affect: Mood normal.        Behavior: Behavior normal.        Thought Content: Thought content normal.        Judgment: Judgment normal.       Assessment & Plan    Mild intermittent asthma without complication Assessment & Plan: Start advair discus. - take 1 inhalation twice daily. May use albuterol inhaler as needed and as prescribed.   Orders: -     Fluticasone-Salmeterol; Inhale 1 puff into the lungs in the morning and at bedtime.  Dispense: 60 each; Refill: 5 -     Albuterol Sulfate HFA; Inhale 2 puffs into the lungs every 6 (six) hours as needed for wheezing or shortness of breath.  Dispense: 6.7 g; Refill: 5  Multiple sclerosis (HCC) Assessment & Plan: Stable. Continue regular visits with neurology as scheduled.    BMI 28.0-28.9,adult Assessment & Plan: Continues to improve. Conitnue  with Wegovy 2.4 mg weekly. Continue with low calorie, high-protein diet. Incorporate exercise into daily activities.       Problem List Items Addressed This Visit       Respiratory   Asthma - Primary    Start advair discus. - take 1 inhalation twice daily. May use albuterol inhaler as needed and as prescribed.       Relevant Medications   fluticasone-salmeterol (ADVAIR DISKUS) 250-50 MCG/ACT AEPB   albuterol (VENTOLIN HFA) 108 (90 Base) MCG/ACT inhaler     Nervous and Auditory   Multiple sclerosis (HCC)    Stable. Continue regular visits with neurology as scheduled.         Other   BMI 28.0-28.9,adult    Continues to improve. Conitnue  with  Wegovy 2.4 mg weekly. Continue with low calorie, high-protein diet. Incorporate exercise into daily activities.          Return in about 2 months (around 01/06/2023) for routine - weight management.         Ronnell Freshwater, NP  [facility] [phone] (phone) [phone] (fax)  [place]

## 2022-11-08 ENCOUNTER — Other Ambulatory Visit (HOSPITAL_COMMUNITY): Payer: Self-pay

## 2022-11-08 DIAGNOSIS — Z6827 Body mass index (BMI) 27.0-27.9, adult: Secondary | ICD-10-CM | POA: Diagnosis not present

## 2022-11-08 DIAGNOSIS — Z113 Encounter for screening for infections with a predominantly sexual mode of transmission: Secondary | ICD-10-CM | POA: Diagnosis not present

## 2022-11-08 DIAGNOSIS — Z01419 Encounter for gynecological examination (general) (routine) without abnormal findings: Secondary | ICD-10-CM | POA: Diagnosis not present

## 2022-11-09 ENCOUNTER — Other Ambulatory Visit (HOSPITAL_COMMUNITY): Payer: Self-pay

## 2022-11-09 MED ORDER — NORETHIN ACE-ETH ESTRAD-FE 1-20 MG-MCG PO TABS
1.0000 | ORAL_TABLET | Freq: Every day | ORAL | 3 refills | Status: AC
  Filled 2022-11-09: qty 84, 84d supply, fill #0

## 2022-11-15 ENCOUNTER — Other Ambulatory Visit (HOSPITAL_COMMUNITY): Payer: Self-pay

## 2022-11-16 ENCOUNTER — Other Ambulatory Visit (HOSPITAL_COMMUNITY): Payer: Self-pay

## 2022-11-21 ENCOUNTER — Other Ambulatory Visit (HOSPITAL_COMMUNITY): Payer: Self-pay

## 2022-11-22 ENCOUNTER — Other Ambulatory Visit (HOSPITAL_COMMUNITY): Payer: Self-pay

## 2022-11-27 ENCOUNTER — Ambulatory Visit
Admission: RE | Admit: 2022-11-27 | Discharge: 2022-11-27 | Disposition: A | Discharge status: Home / Self Care | Payer: Commercial Managed Care - PPO | Source: Ambulatory Visit | Attending: Neurology | Admitting: Neurology

## 2022-11-27 ENCOUNTER — Other Ambulatory Visit (HOSPITAL_COMMUNITY): Payer: Self-pay

## 2022-11-27 DIAGNOSIS — G35 Multiple sclerosis: Secondary | ICD-10-CM

## 2022-11-27 MED ORDER — GADOPICLENOL 0.5 MMOL/ML IV SOLN
9.0000 mL | Freq: Once | INTRAVENOUS | Status: AC | PRN
  Administered 2022-11-27: 9 mL via INTRAVENOUS

## 2022-12-05 ENCOUNTER — Other Ambulatory Visit: Payer: Self-pay | Admitting: *Deleted

## 2022-12-05 ENCOUNTER — Encounter: Payer: Self-pay | Admitting: Neurology

## 2022-12-05 DIAGNOSIS — G35 Multiple sclerosis: Secondary | ICD-10-CM

## 2022-12-05 DIAGNOSIS — F988 Other specified behavioral and emotional disorders with onset usually occurring in childhood and adolescence: Secondary | ICD-10-CM

## 2022-12-05 DIAGNOSIS — G43009 Migraine without aura, not intractable, without status migrainosus: Secondary | ICD-10-CM

## 2022-12-05 DIAGNOSIS — R208 Other disturbances of skin sensation: Secondary | ICD-10-CM

## 2022-12-05 DIAGNOSIS — N319 Neuromuscular dysfunction of bladder, unspecified: Secondary | ICD-10-CM

## 2022-12-05 DIAGNOSIS — M542 Cervicalgia: Secondary | ICD-10-CM

## 2022-12-06 ENCOUNTER — Other Ambulatory Visit: Payer: Self-pay | Admitting: Nurse Practitioner

## 2022-12-06 DIAGNOSIS — E559 Vitamin D deficiency, unspecified: Secondary | ICD-10-CM

## 2022-12-07 ENCOUNTER — Other Ambulatory Visit: Payer: Self-pay

## 2022-12-08 NOTE — Assessment & Plan Note (Signed)
Stable. Continue regular visits with neurology as scheduled.

## 2022-12-08 NOTE — Assessment & Plan Note (Addendum)
Continues to improve. Conitnue  with Wegovy 1.7 mg weekly. Continue with low calorie, high-protein diet. Incorporate exercise into daily activities.

## 2022-12-08 NOTE — Assessment & Plan Note (Signed)
Start advair discus. - take 1 inhalation twice daily. May use albuterol inhaler as needed and as prescribed.

## 2022-12-08 NOTE — Assessment & Plan Note (Signed)
>>  ASSESSMENT AND PLAN FOR MULTIPLE SCLEROSIS (HCC) WRITTEN ON 12/08/2022  8:49 PM BY BOSCIA, HEATHER E, NP  Stable. Continue regular visits with neurology as scheduled.

## 2022-12-10 ENCOUNTER — Telehealth: Payer: Self-pay | Admitting: Neurology

## 2022-12-10 ENCOUNTER — Other Ambulatory Visit (HOSPITAL_COMMUNITY): Payer: Self-pay

## 2022-12-10 NOTE — Telephone Encounter (Signed)
Referral sent to [facility] for transfer of care per pt request d/t moving. fax number [phone]

## 2022-12-19 NOTE — Telephone Encounter (Signed)
Can someone help her with this? Thanks!

## 2022-12-19 NOTE — Addendum Note (Signed)
Addended by: Thamas Jaegers on: 12/19/2022 02:57 PM   Modules accepted: Orders

## 2022-12-24 ENCOUNTER — Encounter: Payer: Self-pay | Admitting: Nurse Practitioner

## 2023-01-01 ENCOUNTER — Other Ambulatory Visit (HOSPITAL_COMMUNITY): Payer: Self-pay

## 2023-01-01 ENCOUNTER — Other Ambulatory Visit: Payer: Self-pay | Admitting: Nurse Practitioner

## 2023-01-01 ENCOUNTER — Other Ambulatory Visit: Payer: Self-pay

## 2023-01-01 DIAGNOSIS — Z6829 Body mass index (BMI) 29.0-29.9, adult: Secondary | ICD-10-CM

## 2023-01-01 DIAGNOSIS — E559 Vitamin D deficiency, unspecified: Secondary | ICD-10-CM

## 2023-01-01 MED ORDER — ERGOCALCIFEROL 1.25 MG (50000 UT) PO CAPS
1.0000 | ORAL_CAPSULE | ORAL | 1 refills | Status: AC
  Filled 2023-01-01: qty 12, 84d supply, fill #0

## 2023-01-01 MED ORDER — WEGOVY 1.7 MG/0.75ML ~~LOC~~ SOAJ
1.7000 mg | SUBCUTANEOUS | 2 refills | Status: DC
  Filled 2023-01-01 – 2023-01-04 (×3): qty 3, 28d supply, fill #0
  Filled 2023-02-12: qty 3, 28d supply, fill #1

## 2023-01-02 ENCOUNTER — Encounter: Payer: Self-pay | Admitting: Neurology

## 2023-01-04 ENCOUNTER — Other Ambulatory Visit (HOSPITAL_COMMUNITY): Payer: Self-pay

## 2023-01-07 ENCOUNTER — Other Ambulatory Visit (HOSPITAL_COMMUNITY): Payer: Self-pay

## 2023-01-08 ENCOUNTER — Ambulatory Visit: Payer: Commercial Managed Care - PPO | Admitting: Neurology

## 2023-01-10 ENCOUNTER — Other Ambulatory Visit (HOSPITAL_COMMUNITY): Payer: Self-pay

## 2023-01-11 ENCOUNTER — Encounter: Payer: Self-pay | Admitting: Nurse Practitioner

## 2023-01-11 ENCOUNTER — Telehealth (INDEPENDENT_AMBULATORY_CARE_PROVIDER_SITE_OTHER): Payer: Commercial Managed Care - PPO | Admitting: Nurse Practitioner

## 2023-01-11 VITALS — Ht 68.0 in | Wt 174.0 lb

## 2023-01-11 DIAGNOSIS — Z6826 Body mass index (BMI) 26.0-26.9, adult: Secondary | ICD-10-CM | POA: Diagnosis not present

## 2023-01-11 DIAGNOSIS — G35 Multiple sclerosis: Secondary | ICD-10-CM | POA: Diagnosis not present

## 2023-01-11 NOTE — Assessment & Plan Note (Signed)
>>  ASSESSMENT AND PLAN FOR MULTIPLE SCLEROSIS (HCC) WRITTEN ON 01/11/2023 10:24 AM BY BOSCIA, HEATHER E, NP  Stable. Followed by neurology.

## 2023-01-11 NOTE — Progress Notes (Signed)
Virtual Visit via Telephone Note  I connected with Ashley Reed on 01/11/23 at 10:10 AM EDT by telephone and verified that I am speaking with the correct person using two identifiers.  Location: Patient: home Provider: [place] [facility] at [facility]     I discussed the limitations, risks, security and privacy concerns of performing an evaluation and management service by telephone and the availability of in person appointments. I also discussed with the patient that there may be a patient responsible charge related to this service. The patient expressed understanding and agreed to proceed.    Patient: Ashley Reed   DOB: [birth year]   22 y.o. Female  MRN: [id number] Visit Date: 01/11/2023   Chief Complaint  Patient presents with   Weight Check   Subjective    HPI  Follow up for weight management Initial weight 07/13/2021 - 241 Most recent weight 11/07/2022 - 184 Today's weight 01/11/2023 - 174 Weight loss since most recent visit here 10 pounds  Total weight loss since starting weight management program - 60 pounds  She is no longer having negative side effects from this medication.   Has moved to [place], [place]  -has got new job -doing well   -She denies chest pain, chest pressure, or shortness of breath. She denies headaches or visual disturbances. She denies abdominal pain, nausea, vomiting, or changes in bowel or bladder habits.    Medications: Outpatient Medications Prior to Visit  Medication Sig   adapalene (DIFFERIN) 0.1 % cream APPLY PEA SIZED AMOUNT TO FINGER AND SPREAD THIN LAYER TO ACNE AREAS ONCE A DAY IN MORNING   albuterol (VENTOLIN HFA) 108 (90 Base) MCG/ACT inhaler Inhale 2 puffs into the lungs every 6 (six) hours as needed for wheezing or shortness of breath.   amoxicillin-clavulanate (AUGMENTIN) 875-125 MG tablet Take 1 tablet by mouth 2 (two) times daily.   B Complex Vitamins (VITAMIN B COMPLEX PO) Take by mouth.   cetirizine (ZYRTEC) 10 MG  tablet Take 10 mg by mouth daily.   clindamycin (CLINDAGEL) 1 % gel APPLY A THIN LAYER TO THE AFFECTED AREA(S)  2 TIMES PER DAY   Cyanocobalamin (VITAMIN B-12 PO) Take by mouth.   diclofenac sodium (VOLTAREN) 1 % GEL Apply to large joint area up to three times a day as needed   ergocalciferol (VITAMIN D2) 1.25 MG (50000 UT) capsule Take 1 capsule (50,000 Units total) by mouth once a week.   fluconazole (DIFLUCAN) 150 MG tablet Take 1 tablet by mouth once, may repeat in 3 days if symptoms persist   fluticasone-salmeterol (ADVAIR DISKUS) 250-50 MCG/ACT AEPB Inhale 1 puff into the lungs in the morning and at bedtime.   gabapentin (NEURONTIN) 300 MG capsule Take 1 capsule (300 mg total) by mouth 3 (three) times daily.   Galcanezumab-gnlm (EMGALITY) 120 MG/ML SOAJ Inject 120 mg into the skin every 30 (thirty) days.   ipratropium (ATROVENT) 0.03 % nasal spray Place 2 sprays into both nostrils every 12 (twelve) hours.   linaclotide (LINZESS) 145 MCG CAPS capsule Take 1 capsule (145 mcg total) by mouth daily before breakfast.   meloxicam (MOBIC) 15 MG tablet Take 1 tablet (15 mg total) by mouth daily.   norethindrone-ethinyl estradiol-FE (BLISOVI FE 1/20) 1-20 MG-MCG tablet Take 1 tablet by mouth daily.   norethindrone-ethinyl estradiol-FE (BLISOVI FE 1/20) 1-20 MG-MCG tablet TAKE 1 TABLET BY MOUTH ONCE DAILY.   ondansetron (ZOFRAN-ODT) 4 MG disintegrating tablet Dissolve 1-2 tablets (4-8 mg total) by mouth every 8 (  eight) hours as needed for nausea or vomiting.   prednisoLONE acetate (PRED FORTE) 1 % ophthalmic suspension Place 1 drop into the right eye 4 (four) times daily. (Patient taking differently: Place 1 drop into the right eye 4 (four) times daily as needed.)   rizatriptan (MAXALT-MLT) 10 MG disintegrating tablet Dissolve 1 tablet (10 mg total) by mouth as needed for migraine. May repeat in 2 hours if needed   Semaglutide-Weight Management (WEGOVY) 1.7 MG/0.75ML SOAJ Inject 1.7 mg into the skin  once a week.   tiZANidine (ZANAFLEX) 2 MG tablet Take 1-2 tablets (2-4 mg total) by mouth at bedtime for spasticity   No facility-administered medications prior to visit.    Review of Systems See HPI      Objective     Today's Vitals   01/11/23 0957  Weight: 174 lb (78.9 kg)  Height: 5\' 8"  (1.727 m)   Body mass index is 26.46 kg/m.  BP Readings from Last 3 Encounters:  11/07/22 98/65  10/18/22 (Abnormal) 105/55  09/06/22 102/72    Wt Readings from Last 3 Encounters:  01/11/23 174 lb (78.9 kg)  11/07/22 184 lb 12.8 oz (83.8 kg)  09/06/22 188 lb (85.3 kg)    Physical Exam   The patient is alert and oriented. She is pleasant and answers all questions appropriately. Breathing is non-labored. She is in no acute distress at this time.     Assessment & Plan    Multiple sclerosis (HCC) Assessment & Plan: Stable. Followed by neurology.    BMI 26.0-26.9,adult Assessment & Plan: Improving with 10 pound weight loss since last visit.  -continue Wegovy  1.7 mg weekly  -Continue with low calorie, high-protein diet. Incorporate exercise into daily activities.        Return in about 3 months (around 04/12/2023) for routine - weight management - may need to be telehealth.     I discussed the assessment and treatment plan with the patient. The patient was provided an opportunity to ask questions and all were answered. The patient agreed with the plan and demonstrated an understanding of the instructions.   The patient was advised to call back or seek an in-person evaluation if the symptoms worsen or if the condition fails to improve as anticipated.  I provided 10 minutes of non-face-to-face time during this encounter.   Carlean Jews, NPEstablished patient visit     Carlean Jews, NP  [facility] at [facility] [phone] (phone) [phone] (fax)  [facility]

## 2023-01-11 NOTE — Assessment & Plan Note (Signed)
Stable.  Followed by neurology.   

## 2023-01-11 NOTE — Assessment & Plan Note (Signed)
Improving with 10 pound weight loss since last visit.  -continue Wegovy  1.7 mg weekly  -Continue with low calorie, high-protein diet. Incorporate exercise into daily activities.

## 2023-01-15 ENCOUNTER — Encounter: Payer: Self-pay | Admitting: Nurse Practitioner

## 2023-01-15 DIAGNOSIS — Z3009 Encounter for other general counseling and advice on contraception: Secondary | ICD-10-CM | POA: Diagnosis not present

## 2023-01-15 DIAGNOSIS — R3911 Hesitancy of micturition: Secondary | ICD-10-CM | POA: Diagnosis not present

## 2023-01-15 DIAGNOSIS — D849 Immunodeficiency, unspecified: Secondary | ICD-10-CM | POA: Diagnosis not present

## 2023-01-15 DIAGNOSIS — R202 Paresthesia of skin: Secondary | ICD-10-CM | POA: Diagnosis not present

## 2023-01-15 DIAGNOSIS — G35 Multiple sclerosis: Secondary | ICD-10-CM | POA: Diagnosis not present

## 2023-01-15 DIAGNOSIS — Z8669 Personal history of other diseases of the nervous system and sense organs: Secondary | ICD-10-CM | POA: Diagnosis not present

## 2023-01-15 DIAGNOSIS — R5383 Other fatigue: Secondary | ICD-10-CM | POA: Diagnosis not present

## 2023-01-15 DIAGNOSIS — G43109 Migraine with aura, not intractable, without status migrainosus: Secondary | ICD-10-CM | POA: Diagnosis not present

## 2023-01-15 DIAGNOSIS — Z79899 Other long term (current) drug therapy: Secondary | ICD-10-CM | POA: Diagnosis not present

## 2023-01-15 DIAGNOSIS — E559 Vitamin D deficiency, unspecified: Secondary | ICD-10-CM | POA: Diagnosis not present

## 2023-01-18 DIAGNOSIS — H53002 Unspecified amblyopia, left eye: Secondary | ICD-10-CM | POA: Diagnosis not present

## 2023-01-18 DIAGNOSIS — G43109 Migraine with aura, not intractable, without status migrainosus: Secondary | ICD-10-CM | POA: Diagnosis not present

## 2023-01-18 DIAGNOSIS — H539 Unspecified visual disturbance: Secondary | ICD-10-CM | POA: Diagnosis not present

## 2023-01-18 DIAGNOSIS — G379 Demyelinating disease of central nervous system, unspecified: Secondary | ICD-10-CM | POA: Diagnosis not present

## 2023-01-18 DIAGNOSIS — G35 Multiple sclerosis: Secondary | ICD-10-CM | POA: Diagnosis not present

## 2023-01-23 DIAGNOSIS — D849 Immunodeficiency, unspecified: Secondary | ICD-10-CM | POA: Diagnosis not present

## 2023-01-23 DIAGNOSIS — G478 Other sleep disorders: Secondary | ICD-10-CM | POA: Diagnosis not present

## 2023-01-23 DIAGNOSIS — Z79899 Other long term (current) drug therapy: Secondary | ICD-10-CM | POA: Diagnosis not present

## 2023-01-23 DIAGNOSIS — G35 Multiple sclerosis: Secondary | ICD-10-CM | POA: Diagnosis not present

## 2023-01-23 DIAGNOSIS — E559 Vitamin D deficiency, unspecified: Secondary | ICD-10-CM | POA: Diagnosis not present

## 2023-01-23 DIAGNOSIS — R5383 Other fatigue: Secondary | ICD-10-CM | POA: Diagnosis not present

## 2023-01-23 DIAGNOSIS — R61 Generalized hyperhidrosis: Secondary | ICD-10-CM | POA: Diagnosis not present

## 2023-01-23 DIAGNOSIS — G43109 Migraine with aura, not intractable, without status migrainosus: Secondary | ICD-10-CM | POA: Diagnosis not present

## 2023-01-29 DIAGNOSIS — H5213 Myopia, bilateral: Secondary | ICD-10-CM | POA: Diagnosis not present

## 2023-01-29 DIAGNOSIS — G35 Multiple sclerosis: Secondary | ICD-10-CM | POA: Diagnosis not present

## 2023-01-29 DIAGNOSIS — G43809 Other migraine, not intractable, without status migrainosus: Secondary | ICD-10-CM | POA: Diagnosis not present

## 2023-02-04 ENCOUNTER — Other Ambulatory Visit (HOSPITAL_COMMUNITY): Payer: Self-pay

## 2023-02-12 ENCOUNTER — Other Ambulatory Visit (HOSPITAL_COMMUNITY): Payer: Self-pay

## 2023-02-14 ENCOUNTER — Other Ambulatory Visit: Payer: Self-pay

## 2023-02-14 DIAGNOSIS — Z79899 Other long term (current) drug therapy: Secondary | ICD-10-CM | POA: Diagnosis not present

## 2023-02-14 DIAGNOSIS — G43109 Migraine with aura, not intractable, without status migrainosus: Secondary | ICD-10-CM | POA: Diagnosis not present

## 2023-02-14 DIAGNOSIS — R3911 Hesitancy of micturition: Secondary | ICD-10-CM | POA: Diagnosis not present

## 2023-02-14 DIAGNOSIS — G8929 Other chronic pain: Secondary | ICD-10-CM | POA: Diagnosis not present

## 2023-02-14 DIAGNOSIS — M542 Cervicalgia: Secondary | ICD-10-CM | POA: Diagnosis not present

## 2023-02-14 DIAGNOSIS — D849 Immunodeficiency, unspecified: Secondary | ICD-10-CM | POA: Diagnosis not present

## 2023-02-14 DIAGNOSIS — G35 Multiple sclerosis: Secondary | ICD-10-CM | POA: Diagnosis not present

## 2023-02-14 DIAGNOSIS — R202 Paresthesia of skin: Secondary | ICD-10-CM | POA: Diagnosis not present

## 2023-02-14 DIAGNOSIS — M5481 Occipital neuralgia: Secondary | ICD-10-CM | POA: Diagnosis not present

## 2023-02-14 DIAGNOSIS — M62838 Other muscle spasm: Secondary | ICD-10-CM | POA: Diagnosis not present

## 2023-02-14 DIAGNOSIS — E559 Vitamin D deficiency, unspecified: Secondary | ICD-10-CM | POA: Diagnosis not present

## 2023-02-18 ENCOUNTER — Other Ambulatory Visit (HOSPITAL_COMMUNITY): Payer: Self-pay

## 2023-03-06 DIAGNOSIS — Z113 Encounter for screening for infections with a predominantly sexual mode of transmission: Secondary | ICD-10-CM | POA: Diagnosis not present

## 2023-03-08 DIAGNOSIS — R61 Generalized hyperhidrosis: Secondary | ICD-10-CM | POA: Diagnosis not present

## 2023-03-08 DIAGNOSIS — R5383 Other fatigue: Secondary | ICD-10-CM | POA: Diagnosis not present

## 2023-03-08 DIAGNOSIS — G478 Other sleep disorders: Secondary | ICD-10-CM | POA: Diagnosis not present

## 2023-03-11 DIAGNOSIS — M542 Cervicalgia: Secondary | ICD-10-CM | POA: Diagnosis not present

## 2023-03-11 DIAGNOSIS — M62838 Other muscle spasm: Secondary | ICD-10-CM | POA: Diagnosis not present

## 2023-03-11 DIAGNOSIS — G8929 Other chronic pain: Secondary | ICD-10-CM | POA: Diagnosis not present

## 2023-03-13 ENCOUNTER — Other Ambulatory Visit (HOSPITAL_COMMUNITY): Payer: Self-pay

## 2023-03-18 ENCOUNTER — Other Ambulatory Visit (HOSPITAL_COMMUNITY): Payer: Self-pay

## 2023-03-18 DIAGNOSIS — G479 Sleep disorder, unspecified: Secondary | ICD-10-CM | POA: Diagnosis not present

## 2023-03-27 ENCOUNTER — Other Ambulatory Visit (HOSPITAL_COMMUNITY): Payer: Self-pay

## 2023-04-02 ENCOUNTER — Other Ambulatory Visit (HOSPITAL_COMMUNITY): Payer: Self-pay

## 2023-04-02 ENCOUNTER — Telehealth: Payer: Self-pay

## 2023-04-02 NOTE — Telephone Encounter (Signed)
Received a PA request-PT has transferred care to [facility].

## 2023-04-03 ENCOUNTER — Ambulatory Visit: Payer: Commercial Managed Care - PPO | Admitting: Nurse Practitioner

## 2023-04-04 ENCOUNTER — Ambulatory Visit (INDEPENDENT_AMBULATORY_CARE_PROVIDER_SITE_OTHER): Payer: Commercial Managed Care - PPO | Admitting: Family Medicine

## 2023-04-04 ENCOUNTER — Telehealth: Payer: Self-pay

## 2023-04-04 ENCOUNTER — Encounter: Payer: Self-pay | Admitting: Family Medicine

## 2023-04-04 ENCOUNTER — Other Ambulatory Visit (HOSPITAL_COMMUNITY): Payer: Self-pay

## 2023-04-04 VITALS — BP 94/65 | HR 91 | Ht 68.0 in | Wt 177.4 lb

## 2023-04-04 DIAGNOSIS — Z6826 Body mass index (BMI) 26.0-26.9, adult: Secondary | ICD-10-CM | POA: Diagnosis not present

## 2023-04-04 DIAGNOSIS — L710 Perioral dermatitis: Secondary | ICD-10-CM | POA: Diagnosis not present

## 2023-04-04 DIAGNOSIS — D849 Immunodeficiency, unspecified: Secondary | ICD-10-CM | POA: Diagnosis not present

## 2023-04-04 DIAGNOSIS — B998 Other infectious disease: Secondary | ICD-10-CM | POA: Diagnosis not present

## 2023-04-04 DIAGNOSIS — E611 Iron deficiency: Secondary | ICD-10-CM

## 2023-04-04 MED ORDER — PHENTERMINE HCL 15 MG PO CAPS
15.0000 mg | ORAL_CAPSULE | ORAL | 2 refills | Status: AC
  Filled 2023-04-04: qty 30, 30d supply, fill #0

## 2023-04-04 MED ORDER — TOPIRAMATE 25 MG PO TABS
25.0000 mg | ORAL_TABLET | Freq: Two times a day (BID) | ORAL | 1 refills | Status: DC
  Filled 2023-04-04: qty 90, 45d supply, fill #0

## 2023-04-04 MED ORDER — UBRELVY 100 MG PO TABS
ORAL_TABLET | ORAL | 9 refills | Status: AC
  Filled 2023-04-04 – 2023-04-16 (×2): qty 16, 30d supply, fill #0
  Filled 2023-10-01: qty 16, 30d supply, fill #1
  Filled 2024-03-26: qty 16, 30d supply, fill #2

## 2023-04-04 MED ORDER — METRONIDAZOLE 0.75 % EX GEL
1.0000 | Freq: Two times a day (BID) | CUTANEOUS | 0 refills | Status: DC
  Filled 2023-04-04: qty 45, 23d supply, fill #0

## 2023-04-04 NOTE — Telephone Encounter (Signed)
Patient cam in office to have her labs repeated today.

## 2023-04-04 NOTE — Progress Notes (Signed)
Established Patient Office Visit  Subjective   Patient ID: Ashley Reed, female    DOB: August 24, 2001  Age: 22 y.o. MRN: [id number]  Chief Complaint  Patient presents with   Weight Check    HPI Ashley Reed is a 22 y.o. female presenting today for follow up of weight management.  She also has concerns over a new rash on her face that started at the end of May.  It has been red along her nasolabial folds which has been waxing and waning.  The only correlation she could think of it was starting a ferrous sulfate supplement.  She may have noticed moderate improvement when stopping the iron supplement, but it did not resolve completely. Weight management: Weight has increased 3 lbs since last visit.  She is currently taking her remaining Wegovy 1.7 mg injections which she has had to space out to every other week since insurance stopped covering the medication.  She would like to discuss different management options moving forward so that she does not regain the weight that she has lost.  Prior to taking Wegovy, she did take phentermine with some success.  She finds that she eats quickly so she does not always feel when she is full and overeats.  She would also like something that will help with cravings.  ROS Negative unless otherwise noted in HPI   Objective:     BP 94/65   Pulse 91   Ht 5\' 8"  (1.727 m)   Wt 177 lb 6.4 oz (80.5 kg)   LMP 03/29/2023   SpO2 97%   BMI 26.97 kg/m   Physical Exam Constitutional:      General: She is not in acute distress.    Appearance: Normal appearance.  HENT:     Head: Normocephalic and atraumatic.  Cardiovascular:     Rate and Rhythm: Normal rate and regular rhythm.     Heart sounds: No murmur heard.    No friction rub. No gallop.  Pulmonary:     Effort: Pulmonary effort is normal. No respiratory distress.     Breath sounds: No wheezing, rhonchi or rales.  Skin:    General: Skin is warm and dry.     Findings: Rash present. Rash is  papular and pustular.     Comments: Papular pustular rash of nasolabial folds without comedones.  Neurological:     Mental Status: She is alert and oriented to person, place, and time.      Assessment & Plan:  BMI 26.0-26.9,adult -     Topiramate; Take 1 tablet (25 mg total) by mouth 2 (two) times daily.  Dispense: 90 tablet; Refill: 1 -     Phentermine HCl; Take 1 capsule (15 mg total) by mouth every morning.  Dispense: 30 capsule; Refill: 2  Perioral dermatitis -     metroNIDAZOLE; Apply 1 Application topically 2 (two) times daily.  Dispense: 45 g; Refill: 0  Iron deficiency -     Iron and TIBC; Future -     Ferritin; Future  Appearance of rash consistent with perioral dermatitis.  Patient did not use any topical steroids which is the most common cause.  I would recommend switching to a different formulation iron supplement to see if this alleviates her symptoms while still supplementing iron.  Starting course of topical metronidazole for perioral dermatitis.  This will also cover for rosacea, which was a other possibility in the differential diagnosis.  Return in about 6 weeks (around 05/16/2023) for follow-up  for weight management, started phentermine/topiramate (in person).    Melida Quitter, PA

## 2023-04-05 ENCOUNTER — Non-Acute Institutional Stay (HOSPITAL_COMMUNITY)
Admission: RE | Admit: 2023-04-05 | Discharge: 2023-04-05 | Disposition: A | Discharge status: Home / Self Care | Payer: Commercial Managed Care - PPO | Source: Ambulatory Visit | Attending: Internal Medicine | Admitting: Internal Medicine

## 2023-04-05 ENCOUNTER — Other Ambulatory Visit (HOSPITAL_COMMUNITY): Payer: Self-pay

## 2023-04-05 DIAGNOSIS — G35 Multiple sclerosis: Secondary | ICD-10-CM | POA: Insufficient documentation

## 2023-04-05 LAB — T + B-LYMPHOCYTE DIFFERENTIAL
% CD 3 Pos. Lymph.: 90.7 % — ABNORMAL HIGH (ref 57.5–86.2)
% CD 4 Pos. Lymph.: 59.2 % — ABNORMAL HIGH (ref 30.8–58.5)
Absolute CD 3: 1542 /uL (ref 622–2402)
Absolute CD 4 Helper: 1006 /uL (ref 359–1519)
Basophils Absolute: 0 10*3/uL (ref 0.0–0.2)
Basos: 1 %
CD19 % B Cell: 0.1 % — ABNORMAL LOW (ref 3.3–25.4)
CD19 Abs: 2 /uL — ABNORMAL LOW (ref 12–645)
CD4/CD8 Ratio: 1.91 (ref 0.92–3.72)
CD8 % Suppressor T Cell: 31 % (ref 12.0–35.5)
CD8 T Cell Abs: 527 /uL (ref 109–897)
EOS (ABSOLUTE): 0 10*3/uL (ref 0.0–0.4)
Eos: 1 %
Hematocrit: 45.6 % (ref 34.0–46.6)
Hemoglobin: 15.2 g/dL (ref 11.1–15.9)
Immature Grans (Abs): 0 10*3/uL (ref 0.0–0.1)
Immature Granulocytes: 0 %
Lymphocytes Absolute: 1.7 10*3/uL (ref 0.7–3.1)
Lymphs: 23 %
MCH: 31.1 pg (ref 26.6–33.0)
MCHC: 33.3 g/dL (ref 31.5–35.7)
MCV: 93 fL (ref 79–97)
Monocytes Absolute: 0.4 10*3/uL (ref 0.1–0.9)
Monocytes: 6 %
Neutrophils Absolute: 5.2 10*3/uL (ref 1.4–7.0)
Neutrophils: 69 %
Platelets: 324 10*3/uL (ref 150–450)
RBC: 4.88 x10E6/uL (ref 3.77–5.28)
RDW: 12.1 % (ref 11.7–15.4)
WBC: 7.4 10*3/uL (ref 3.4–10.8)

## 2023-04-05 MED ORDER — SODIUM CHLORIDE 0.9 % IV SOLN: INTRAVENOUS | Status: DC | PRN

## 2023-04-05 MED ORDER — ACETAMINOPHEN 500 MG PO TABS
1000.0000 mg | ORAL_TABLET | Freq: Once | ORAL | Status: AC
  Administered 2023-04-05: 1000 mg via ORAL

## 2023-04-05 MED ORDER — DIPHENHYDRAMINE HCL 50 MG/ML IJ SOLN
50.0000 mg | Freq: Once | INTRAMUSCULAR | Status: AC
  Administered 2023-04-05: 50 mg via INTRAVENOUS

## 2023-04-05 MED ORDER — METHYLPREDNISOLONE SODIUM SUCC 125 MG IJ SOLR
125.0000 mg | Freq: Once | INTRAMUSCULAR | Status: AC
  Administered 2023-04-05: 125 mg via INTRAVENOUS

## 2023-04-05 MED ORDER — DIPHENHYDRAMINE HCL 50 MG/ML IJ SOLN: 50.0000 mg | Freq: Once | INTRAMUSCULAR | Status: DC | PRN

## 2023-04-05 MED ORDER — SODIUM CHLORIDE 0.9 % IV SOLN
600.0000 mg | Freq: Once | INTRAVENOUS | Status: AC
  Administered 2023-04-05: 600 mg via INTRAVENOUS
  Filled 2023-04-05: qty 20

## 2023-04-05 NOTE — Progress Notes (Signed)
PATIENT CARE CENTER NOTE   Diagnosis: Multiple Sclerosis G35   Provider: Lannette Donath, MD   Procedure: Ocrevus infusion    Note: Patient received Ocrevus infusion via PIV. Patient pre-medicated with 1000 mg Tylenol PO, 50 mg Benadryl IV and 125 mg Solumedrol IV per order. Patient tolerated infusion well with no adverse reaction. Patient declined to wait for the full 1 hour post infusion. Patient observed for 15 minutes post infusion.  Vital signs stable. Printed AVS offered but patient refused. Patient alert, oriented and ambulatory at discharge. Discharged home with mother.

## 2023-04-12 ENCOUNTER — Other Ambulatory Visit (HOSPITAL_COMMUNITY): Payer: Self-pay

## 2023-04-12 ENCOUNTER — Telehealth: Payer: Commercial Managed Care - PPO | Admitting: Nurse Practitioner

## 2023-04-15 ENCOUNTER — Other Ambulatory Visit (HOSPITAL_COMMUNITY): Payer: Self-pay

## 2023-04-15 ENCOUNTER — Encounter: Payer: Self-pay | Admitting: Family Medicine

## 2023-04-15 MED ORDER — EMGALITY 120 MG/ML ~~LOC~~ SOAJ
120.0000 mg | SUBCUTANEOUS | 11 refills | Status: AC
  Filled 2023-04-15: qty 3, 90d supply, fill #0

## 2023-04-16 ENCOUNTER — Other Ambulatory Visit (HOSPITAL_COMMUNITY): Payer: Self-pay

## 2023-04-19 ENCOUNTER — Other Ambulatory Visit (HOSPITAL_COMMUNITY): Payer: Self-pay

## 2023-04-22 ENCOUNTER — Other Ambulatory Visit (HOSPITAL_COMMUNITY): Payer: Self-pay

## 2023-04-23 ENCOUNTER — Telehealth: Payer: Commercial Managed Care - PPO | Admitting: Physician Assistant

## 2023-04-23 DIAGNOSIS — L03313 Cellulitis of chest wall: Secondary | ICD-10-CM | POA: Diagnosis not present

## 2023-04-23 MED ORDER — CEPHALEXIN 500 MG PO CAPS: 500.0000 mg | ORAL_CAPSULE | Freq: Four times a day (QID) | ORAL | 0 refills | Status: DC

## 2023-04-23 NOTE — Progress Notes (Signed)
E Visit for Cellulitis  We are sorry that you are not feeling well. Here is how we plan to help!  Based on what you shared with me it looks like you have cellulitis.  Cellulitis looks like areas of skin redness, swelling, and warmth; it develops as a result of bacteria entering under the skin. Little red spots and/or bleeding can be seen in skin, and tiny surface sacs containing fluid can occur. Fever can be present. Cellulitis is almost always on one side of a body, and the lower limbs are the most common site of involvement.   I have prescribed:  Keflex 500mg take one by mouth four times a day for 5 days  HOME CARE:  Take your medications as ordered and take all of them, even if the skin irritation appears to be healing.   GET HELP RIGHT AWAY IF:  Symptoms that don't begin to go away within 48 hours. Severe redness persists or worsens If the area turns color, spreads or swells. If it blisters and opens, develops yellow-brown crust or bleeds. You develop a fever or chills. If the pain increases or becomes unbearable.  Are unable to keep fluids and food down.  MAKE SURE YOU   Understand these instructions. Will watch your condition. Will get help right away if you are not doing well or get worse.  Thank you for choosing an e-visit.  Your e-visit answers were reviewed by a board certified advanced clinical practitioner to complete your personal care plan. Depending upon the condition, your plan could have included both over the counter or prescription medications.  Please review your pharmacy choice. Make sure the pharmacy is open so you can pick up prescription now. If there is a problem, you may contact your provider through MyChart messaging and have the prescription routed to another pharmacy.  Your safety is important to us. If you have drug allergies check your prescription carefully.   For the next 24 hours you can use MyChart to ask questions about today's visit, request a  non-urgent call back, or ask for a work or school excuse. You will get an email in the next two days asking about your experience. I hope that your e-visit has been valuable and will speed your recovery.  

## 2023-04-23 NOTE — Progress Notes (Signed)
I have spent 5 minutes in review of e-visit questionnaire, review and updating patient chart, medical decision making and response to patient.    Cody , PA-C    

## 2023-04-24 ENCOUNTER — Encounter: Payer: Self-pay | Admitting: Family Medicine

## 2023-05-01 ENCOUNTER — Telehealth: Payer: Commercial Managed Care - PPO | Admitting: Physician Assistant

## 2023-05-01 DIAGNOSIS — R197 Diarrhea, unspecified: Secondary | ICD-10-CM | POA: Diagnosis not present

## 2023-05-01 NOTE — Progress Notes (Signed)
Because of your symptoms and recent antibiotic use, I feel your condition warrants further evaluation and I recommend that you be seen in a face to face visit. You should be tested for C.difficile that is a severe gut infection that can happen following antibiotic use.    NOTE: There will be NO CHARGE for this eVisit   If you are having a true medical emergency please call 911.      For an urgent face to face visit, [place] has eight urgent care centers for your convenience:   NEW!! [facility] Urgent Care Center at [facility] Get Driving Directions [phone] [address], [address] [place], [address]    [facility] Urgent Care Center at [facility] Get Driving Directions [phone] [address] [place], [place] [address]   [facility] Health Urgent Care Center [facility]) Get Driving Directions [phone] [address] [place], [place] [address]  [facility] Health Urgent Care Center [facility]) Get Driving Directions [phone] [address] [place],  [place]  [address]  [facility] Health Urgent Care Center [facility] - at [employer]  [phone] [address] [place],  [place] [address]   [facility] Urgent Care at [facility] Get Driving Directions [phone] [address] [place], [place] [address]   [facility] Health Urgent Care at [facility] [place] Get Driving Directions  [phone] [address] [place], [place] [address]   [facility] Health Urgent Care at [facility] Care Directions [phone] [address] [place], [place] [address]  Your MyChart E-visit questionnaire answers were reviewed by a board certified advanced clinical practitioner to complete your personal care plan based on your specific symptoms.  Thank you for using e-Visits.    I have spent 5 minutes in review of e-visit questionnaire, review and updating  patient chart, medical decision making and response to patient.   Margaretann Loveless, PA-C

## 2023-05-02 DIAGNOSIS — R197 Diarrhea, unspecified: Secondary | ICD-10-CM | POA: Diagnosis not present

## 2023-05-08 ENCOUNTER — Encounter: Payer: Self-pay | Admitting: Family Medicine

## 2023-05-10 DIAGNOSIS — Z113 Encounter for screening for infections with a predominantly sexual mode of transmission: Secondary | ICD-10-CM | POA: Diagnosis not present

## 2023-05-10 DIAGNOSIS — N760 Acute vaginitis: Secondary | ICD-10-CM | POA: Diagnosis not present

## 2023-05-13 DIAGNOSIS — G8929 Other chronic pain: Secondary | ICD-10-CM | POA: Diagnosis not present

## 2023-05-13 DIAGNOSIS — M62838 Other muscle spasm: Secondary | ICD-10-CM | POA: Diagnosis not present

## 2023-05-13 DIAGNOSIS — M542 Cervicalgia: Secondary | ICD-10-CM | POA: Diagnosis not present

## 2023-06-12 DIAGNOSIS — G35 Multiple sclerosis: Secondary | ICD-10-CM | POA: Diagnosis not present

## 2023-06-12 DIAGNOSIS — E559 Vitamin D deficiency, unspecified: Secondary | ICD-10-CM | POA: Diagnosis not present

## 2023-06-12 DIAGNOSIS — R61 Generalized hyperhidrosis: Secondary | ICD-10-CM | POA: Diagnosis not present

## 2023-06-12 DIAGNOSIS — R5383 Other fatigue: Secondary | ICD-10-CM | POA: Diagnosis not present

## 2023-06-12 DIAGNOSIS — Z79899 Other long term (current) drug therapy: Secondary | ICD-10-CM | POA: Diagnosis not present

## 2023-06-12 DIAGNOSIS — G478 Other sleep disorders: Secondary | ICD-10-CM | POA: Diagnosis not present

## 2023-06-14 ENCOUNTER — Ambulatory Visit: Payer: Commercial Managed Care - PPO | Admitting: Family Medicine

## 2023-06-18 DIAGNOSIS — D509 Iron deficiency anemia, unspecified: Secondary | ICD-10-CM | POA: Diagnosis not present

## 2023-06-18 DIAGNOSIS — G35 Multiple sclerosis: Secondary | ICD-10-CM | POA: Diagnosis not present

## 2023-06-18 DIAGNOSIS — L700 Acne vulgaris: Secondary | ICD-10-CM | POA: Diagnosis not present

## 2023-06-18 DIAGNOSIS — G43009 Migraine without aura, not intractable, without status migrainosus: Secondary | ICD-10-CM | POA: Diagnosis not present

## 2023-07-11 ENCOUNTER — Telehealth: Payer: Commercial Managed Care - PPO | Admitting: Physician Assistant

## 2023-07-11 DIAGNOSIS — B3731 Acute candidiasis of vulva and vagina: Secondary | ICD-10-CM | POA: Diagnosis not present

## 2023-07-11 MED ORDER — FLUCONAZOLE 150 MG PO TABS: ORAL_TABLET | ORAL | 0 refills | Status: DC

## 2023-07-11 NOTE — Progress Notes (Signed)
Message sent to patient requesting further input regarding current symptoms. Awaiting patient response.  

## 2023-07-11 NOTE — Progress Notes (Signed)
I have spent 5 minutes in review of e-visit questionnaire, review and updating patient chart, medical decision making and response to patient.   Mia Milan Cody Jacklynn Dehaas, PA-C    

## 2023-07-11 NOTE — Progress Notes (Signed)

## 2023-07-17 ENCOUNTER — Other Ambulatory Visit: Payer: Self-pay | Admitting: Nurse Practitioner

## 2023-07-17 DIAGNOSIS — R1032 Left lower quadrant pain: Secondary | ICD-10-CM

## 2023-07-17 DIAGNOSIS — K5904 Chronic idiopathic constipation: Secondary | ICD-10-CM

## 2023-07-17 MED ORDER — LINACLOTIDE 145 MCG PO CAPS: 145.0000 ug | ORAL_CAPSULE | Freq: Every day | ORAL | 1 refills | Status: AC

## 2023-07-31 DIAGNOSIS — Z113 Encounter for screening for infections with a predominantly sexual mode of transmission: Secondary | ICD-10-CM | POA: Diagnosis not present

## 2023-07-31 DIAGNOSIS — N898 Other specified noninflammatory disorders of vagina: Secondary | ICD-10-CM | POA: Diagnosis not present

## 2023-08-01 ENCOUNTER — Other Ambulatory Visit (HOSPITAL_COMMUNITY): Payer: Self-pay

## 2023-08-01 MED ORDER — AZITHROMYCIN 250 MG PO TABS
ORAL_TABLET | ORAL | 0 refills | Status: DC
  Filled 2023-08-01: qty 4, 1d supply, fill #0

## 2023-08-20 ENCOUNTER — Telehealth: Payer: Commercial Managed Care - PPO | Admitting: Physician Assistant

## 2023-08-20 DIAGNOSIS — G35 Multiple sclerosis: Secondary | ICD-10-CM | POA: Diagnosis not present

## 2023-08-20 DIAGNOSIS — J069 Acute upper respiratory infection, unspecified: Secondary | ICD-10-CM | POA: Diagnosis not present

## 2023-08-20 DIAGNOSIS — Z79899 Other long term (current) drug therapy: Secondary | ICD-10-CM | POA: Diagnosis not present

## 2023-08-20 DIAGNOSIS — D849 Immunodeficiency, unspecified: Secondary | ICD-10-CM | POA: Diagnosis not present

## 2023-08-20 DIAGNOSIS — R202 Paresthesia of skin: Secondary | ICD-10-CM | POA: Diagnosis not present

## 2023-08-20 MED ORDER — BENZONATATE 100 MG PO CAPS: 100.0000 mg | ORAL_CAPSULE | Freq: Three times a day (TID) | ORAL | 0 refills | Status: DC | PRN

## 2023-08-20 NOTE — Progress Notes (Signed)
I have spent 5 minutes in review of e-visit questionnaire, review and updating patient chart, medical decision making and response to patient.   Mia Milan Cody Jacklynn Dehaas, PA-C    

## 2023-08-20 NOTE — Progress Notes (Signed)

## 2023-08-21 ENCOUNTER — Other Ambulatory Visit (HOSPITAL_COMMUNITY): Payer: Self-pay

## 2023-08-21 MED ORDER — NITROFURANTOIN MONOHYD MACRO 100 MG PO CAPS
100.0000 mg | ORAL_CAPSULE | Freq: Two times a day (BID) | ORAL | 0 refills | Status: DC
  Filled 2023-08-21: qty 10, 5d supply, fill #0

## 2023-08-28 ENCOUNTER — Telehealth: Payer: Commercial Managed Care - PPO | Admitting: Physician Assistant

## 2023-08-28 DIAGNOSIS — J329 Chronic sinusitis, unspecified: Secondary | ICD-10-CM | POA: Diagnosis not present

## 2023-08-28 MED ORDER — AMOXICILLIN-POT CLAVULANATE 875-125 MG PO TABS: 1.0000 | ORAL_TABLET | Freq: Two times a day (BID) | ORAL | 0 refills | Status: AC

## 2023-08-28 NOTE — Progress Notes (Signed)

## 2023-09-12 ENCOUNTER — Other Ambulatory Visit (HOSPITAL_COMMUNITY)
Admission: RE | Admit: 2023-09-12 | Discharge: 2023-09-12 | Disposition: A | Discharge status: Home / Self Care | Payer: Commercial Managed Care - PPO | Source: Ambulatory Visit | Attending: Family Medicine | Admitting: Family Medicine

## 2023-09-12 ENCOUNTER — Ambulatory Visit: Payer: Commercial Managed Care - PPO

## 2023-09-12 VITALS — BP 133/80 | HR 73 | Wt 185.9 lb

## 2023-09-12 DIAGNOSIS — A749 Chlamydial infection, unspecified: Secondary | ICD-10-CM | POA: Insufficient documentation

## 2023-09-12 NOTE — Progress Notes (Cosign Needed)
Ashley Reed is here today for test of cure for Chlamydia. Tested positive on 07/31/23. Completed azithromycin antibiotic shortly after. Self swab instructions given and specimen obtained. Explained patient will be contacted with any abnormal results. Edd Arbour, CNM to room to establish care with patient.   Marjo Bicker, RN 09/12/2023  11:36 AM

## 2023-09-13 LAB — CERVICOVAGINAL ANCILLARY ONLY
Chlamydia: NEGATIVE
Comment: NEGATIVE
Comment: NEGATIVE
Comment: NORMAL
Neisseria Gonorrhea: NEGATIVE
Trichomonas: NEGATIVE

## 2023-09-23 ENCOUNTER — Telehealth: Payer: Commercial Managed Care - PPO | Admitting: Physician Assistant

## 2023-09-23 DIAGNOSIS — T3695XA Adverse effect of unspecified systemic antibiotic, initial encounter: Secondary | ICD-10-CM | POA: Diagnosis not present

## 2023-09-23 DIAGNOSIS — B379 Candidiasis, unspecified: Secondary | ICD-10-CM | POA: Diagnosis not present

## 2023-09-23 DIAGNOSIS — R3989 Other symptoms and signs involving the genitourinary system: Secondary | ICD-10-CM | POA: Diagnosis not present

## 2023-09-23 MED ORDER — FLUCONAZOLE 150 MG PO TABS: 150.0000 mg | ORAL_TABLET | ORAL | 0 refills | Status: DC | PRN

## 2023-09-23 MED ORDER — NITROFURANTOIN MONOHYD MACRO 100 MG PO CAPS: 100.0000 mg | ORAL_CAPSULE | Freq: Two times a day (BID) | ORAL | 0 refills | Status: DC

## 2023-09-23 NOTE — Progress Notes (Signed)
 E-Visit for Urinary Problems  We are sorry that you are not feeling well.  Here is how we plan to help!  Based on what you shared with me it looks like you most likely have a simple urinary tract infection.  A UTI (Urinary Tract Infection) is a bacterial infection of the bladder.  Most cases of urinary tract infections are simple to treat but a key part of your care is to encourage you to drink plenty of fluids and watch your symptoms carefully.  I have prescribed MacroBid 100 mg twice a day for 5 days.  Your symptoms should gradually improve. Call us if the burning in your urine worsens, you develop worsening fever, back pain or pelvic pain or if your symptoms do not resolve after completing the antibiotic.  Diflucan given as prophylaxis as patient tends to get vaginal yeast infections with antibiotic use.  Urinary tract infections can be prevented by drinking plenty of water to keep your body hydrated.  Also be sure when you wipe, wipe from front to back and don't hold it in!  If possible, empty your bladder every 4 hours.  HOME CARE Drink plenty of fluids Compete the full course of the antibiotics even if the symptoms resolve Remember, when you need to go.go. Holding in your urine can increase the likelihood of getting a UTI! GET HELP RIGHT AWAY IF: You cannot urinate You get a high fever Worsening back pain occurs You see blood in your urine You feel sick to your stomach or throw up You feel like you are going to pass out  MAKE SURE YOU  Understand these instructions. Will watch your condition. Will get help right away if you are not doing well or get worse.   Thank you for choosing an e-visit.  Your e-visit answers were reviewed by a board certified advanced clinical practitioner to complete your personal care plan. Depending upon the condition, your plan could have included both over the counter or prescription medications.  Please review your pharmacy choice. Make sure  the pharmacy is open so you can pick up prescription now. If there is a problem, you may contact your provider through [employer] and have the prescription routed to another pharmacy.  Your safety is important to [place]. If you have drug allergies check your prescription carefully.   For the next 24 hours you can use MyChart to ask questions about today's visit, request a non-urgent call back, or ask for a work or school excuse. You will get an email in the next two days asking about your experience. I hope that your e-visit has been valuable and will speed your recovery.   I have spent 5 minutes in review of e-visit questionnaire, review and updating patient chart, medical decision making and response to patient.   Margaretann Loveless, PA-C

## 2023-09-26 DIAGNOSIS — Z113 Encounter for screening for infections with a predominantly sexual mode of transmission: Secondary | ICD-10-CM | POA: Diagnosis not present

## 2023-09-26 DIAGNOSIS — R0981 Nasal congestion: Secondary | ICD-10-CM | POA: Diagnosis not present

## 2023-10-04 ENCOUNTER — Other Ambulatory Visit (HOSPITAL_COMMUNITY): Payer: Self-pay

## 2023-10-08 DIAGNOSIS — G35 Multiple sclerosis: Secondary | ICD-10-CM | POA: Diagnosis not present

## 2023-10-16 DIAGNOSIS — G35 Multiple sclerosis: Secondary | ICD-10-CM | POA: Diagnosis not present

## 2023-10-16 DIAGNOSIS — E559 Vitamin D deficiency, unspecified: Secondary | ICD-10-CM | POA: Diagnosis not present

## 2023-10-16 DIAGNOSIS — R5383 Other fatigue: Secondary | ICD-10-CM | POA: Diagnosis not present

## 2023-10-16 DIAGNOSIS — D849 Immunodeficiency, unspecified: Secondary | ICD-10-CM | POA: Diagnosis not present

## 2023-10-16 DIAGNOSIS — Z79899 Other long term (current) drug therapy: Secondary | ICD-10-CM | POA: Diagnosis not present

## 2023-10-24 ENCOUNTER — Encounter: Payer: Self-pay | Admitting: Family Medicine

## 2023-10-29 ENCOUNTER — Other Ambulatory Visit (HOSPITAL_COMMUNITY): Payer: Self-pay

## 2023-10-29 DIAGNOSIS — Z113 Encounter for screening for infections with a predominantly sexual mode of transmission: Secondary | ICD-10-CM | POA: Diagnosis not present

## 2023-10-29 DIAGNOSIS — J069 Acute upper respiratory infection, unspecified: Secondary | ICD-10-CM | POA: Diagnosis not present

## 2023-10-29 DIAGNOSIS — R051 Acute cough: Secondary | ICD-10-CM | POA: Diagnosis not present

## 2023-11-13 ENCOUNTER — Other Ambulatory Visit (HOSPITAL_COMMUNITY): Payer: Self-pay

## 2023-11-13 ENCOUNTER — Other Ambulatory Visit: Payer: Self-pay | Admitting: Family Medicine

## 2023-11-13 DIAGNOSIS — G43109 Migraine with aura, not intractable, without status migrainosus: Secondary | ICD-10-CM | POA: Diagnosis not present

## 2023-11-13 DIAGNOSIS — R110 Nausea: Secondary | ICD-10-CM

## 2023-11-21 ENCOUNTER — Other Ambulatory Visit (HOSPITAL_COMMUNITY): Payer: Self-pay

## 2023-11-21 MED ORDER — GABAPENTIN 300 MG PO CAPS
300.0000 mg | ORAL_CAPSULE | Freq: Three times a day (TID) | ORAL | 0 refills | Status: AC
  Filled 2023-11-21: qty 90, 30d supply, fill #0

## 2023-11-29 DIAGNOSIS — R300 Dysuria: Secondary | ICD-10-CM | POA: Diagnosis not present

## 2024-01-01 ENCOUNTER — Other Ambulatory Visit (HOSPITAL_COMMUNITY)
Admission: RE | Admit: 2024-01-01 | Discharge: 2024-01-01 | Disposition: A | Discharge status: Home / Self Care | Source: Ambulatory Visit | Attending: Family Medicine | Admitting: Family Medicine

## 2024-01-01 ENCOUNTER — Ambulatory Visit: Admitting: *Deleted

## 2024-01-01 VITALS — BP 134/81 | HR 72 | Ht 69.0 in

## 2024-01-01 DIAGNOSIS — B3731 Acute candidiasis of vulva and vagina: Secondary | ICD-10-CM | POA: Diagnosis not present

## 2024-01-01 DIAGNOSIS — Z113 Encounter for screening for infections with a predominantly sexual mode of transmission: Secondary | ICD-10-CM | POA: Diagnosis present

## 2024-01-01 DIAGNOSIS — Z9189 Other specified personal risk factors, not elsewhere classified: Secondary | ICD-10-CM | POA: Diagnosis not present

## 2024-01-01 NOTE — Progress Notes (Signed)
 Pt presents with request to be checked for STI.  She has recently had unprotected sex however denies vaginal discharge, irritation or itching. Self swab was obtained and pt advised she will be notified of results via Mychart.  She voiced understanding.

## 2024-01-02 LAB — CERVICOVAGINAL ANCILLARY ONLY
Bacterial Vaginitis (gardnerella): NEGATIVE
Candida Glabrata: NEGATIVE
Candida Vaginitis: POSITIVE — AB
Chlamydia: NEGATIVE
Comment: NEGATIVE
Comment: NEGATIVE
Comment: NEGATIVE
Comment: NEGATIVE
Comment: NEGATIVE
Comment: NORMAL
Neisseria Gonorrhea: NEGATIVE
Trichomonas: NEGATIVE

## 2024-01-03 ENCOUNTER — Encounter: Payer: Self-pay | Admitting: Certified Nurse Midwife

## 2024-01-03 ENCOUNTER — Other Ambulatory Visit: Payer: Self-pay | Admitting: Certified Nurse Midwife

## 2024-01-03 MED ORDER — FLUCONAZOLE 150 MG PO TABS: 150.0000 mg | ORAL_TABLET | Freq: Every day | ORAL | 1 refills | Status: AC

## 2024-01-23 DIAGNOSIS — Z113 Encounter for screening for infections with a predominantly sexual mode of transmission: Secondary | ICD-10-CM | POA: Diagnosis not present

## 2024-01-29 DIAGNOSIS — B349 Viral infection, unspecified: Secondary | ICD-10-CM | POA: Diagnosis not present

## 2024-01-29 DIAGNOSIS — J019 Acute sinusitis, unspecified: Secondary | ICD-10-CM | POA: Diagnosis not present

## 2024-01-29 DIAGNOSIS — B9689 Other specified bacterial agents as the cause of diseases classified elsewhere: Secondary | ICD-10-CM | POA: Diagnosis not present

## 2024-02-03 DIAGNOSIS — B308 Other viral conjunctivitis: Secondary | ICD-10-CM | POA: Diagnosis not present

## 2024-02-11 DIAGNOSIS — B308 Other viral conjunctivitis: Secondary | ICD-10-CM | POA: Diagnosis not present

## 2024-03-10 DIAGNOSIS — A493 Mycoplasma infection, unspecified site: Secondary | ICD-10-CM | POA: Diagnosis not present

## 2024-03-10 DIAGNOSIS — N898 Other specified noninflammatory disorders of vagina: Secondary | ICD-10-CM | POA: Diagnosis not present

## 2024-03-10 DIAGNOSIS — B308 Other viral conjunctivitis: Secondary | ICD-10-CM | POA: Diagnosis not present

## 2024-03-10 DIAGNOSIS — R051 Acute cough: Secondary | ICD-10-CM | POA: Diagnosis not present

## 2024-03-10 DIAGNOSIS — J329 Chronic sinusitis, unspecified: Secondary | ICD-10-CM | POA: Diagnosis not present

## 2024-03-10 DIAGNOSIS — B9689 Other specified bacterial agents as the cause of diseases classified elsewhere: Secondary | ICD-10-CM | POA: Diagnosis not present

## 2024-03-18 DIAGNOSIS — D849 Immunodeficiency, unspecified: Secondary | ICD-10-CM | POA: Diagnosis not present

## 2024-03-18 DIAGNOSIS — E559 Vitamin D deficiency, unspecified: Secondary | ICD-10-CM | POA: Diagnosis not present

## 2024-03-18 DIAGNOSIS — G35 Multiple sclerosis: Secondary | ICD-10-CM | POA: Diagnosis not present

## 2024-03-18 DIAGNOSIS — R5383 Other fatigue: Secondary | ICD-10-CM | POA: Diagnosis not present

## 2024-03-18 DIAGNOSIS — Z79899 Other long term (current) drug therapy: Secondary | ICD-10-CM | POA: Diagnosis not present

## 2024-03-24 DIAGNOSIS — G35 Multiple sclerosis: Secondary | ICD-10-CM | POA: Diagnosis not present

## 2024-03-27 ENCOUNTER — Other Ambulatory Visit (HOSPITAL_COMMUNITY): Payer: Self-pay

## 2024-04-06 ENCOUNTER — Other Ambulatory Visit: Payer: Self-pay

## 2024-04-06 ENCOUNTER — Other Ambulatory Visit: Payer: Self-pay | Admitting: Pharmacy Technician

## 2024-04-06 ENCOUNTER — Encounter (HOSPITAL_COMMUNITY): Payer: Self-pay

## 2024-04-06 ENCOUNTER — Other Ambulatory Visit (HOSPITAL_COMMUNITY): Payer: Self-pay

## 2024-04-06 MED ORDER — KESIMPTA 20 MG/0.4ML ~~LOC~~ SOAJ: SUBCUTANEOUS | 0 refills | Status: DC

## 2024-04-06 MED ORDER — KESIMPTA 20 MG/0.4ML ~~LOC~~ SOAJ: SUBCUTANEOUS | 1 refills | Status: DC

## 2024-04-06 NOTE — Progress Notes (Signed)
 Pharmacy Patient Advocate Encounter  Insurance verification completed.   The patient is insured through [facility]   Ran test claim for Kesimpta . Co-pay is $0.  This test claim was processed through [facility]- copay amounts may vary at other pharmacies due to pharmacy/plan contracts, or as the patient moves through the different stages of their insurance plan.

## 2024-04-09 ENCOUNTER — Other Ambulatory Visit: Payer: Self-pay | Admitting: Pharmacist

## 2024-04-09 ENCOUNTER — Other Ambulatory Visit: Payer: Self-pay

## 2024-04-09 ENCOUNTER — Encounter (HOSPITAL_COMMUNITY): Payer: Self-pay

## 2024-04-09 ENCOUNTER — Ambulatory Visit: Attending: Internal Medicine | Admitting: Pharmacist

## 2024-04-09 DIAGNOSIS — Z79899 Other long term (current) drug therapy: Secondary | ICD-10-CM

## 2024-04-09 MED ORDER — KESIMPTA 20 MG/0.4ML ~~LOC~~ SOAJ
SUBCUTANEOUS | 1 refills | Status: AC
  Filled 2024-04-09: qty 0.4, fill #0
  Filled 2024-08-25: qty 0.4, 28d supply, fill #0
  Filled 2024-10-05 – 2024-10-09 (×3): qty 0.4, 28d supply, fill #1

## 2024-04-09 MED ORDER — KESIMPTA 20 MG/0.4ML ~~LOC~~ SOAJ
SUBCUTANEOUS | 0 refills | Status: AC
  Filled 2024-04-09: qty 1.2, fill #0
  Filled 2024-07-28: qty 1.2, 28d supply, fill #0

## 2024-04-09 NOTE — Progress Notes (Signed)
 Patient just received Kesimpta  infusion and doesn't start injecting until December. Will contact her in November to set up first fill. She agrees to contact me if anything changes and she needs the medication sooner.

## 2024-04-09 NOTE — Progress Notes (Signed)
   S: Patient presents today for review of their specialty medication.    Patient is planning to start taking Kesimpta  for MS. Patient is managed by Dr. Berlin for this. She is changing from Ocrevus . Just completed her last infusion of this ~2 weeks ago and plans to switch to the Kesimpta  in December.   Dosing: 20 mg subcutaneous at weeks 0, 1, and 2 followed by once monthly starting at week 4   Adherence: has not yet started    Efficacy: has not yet started    Monitoring:  CBC (monitor for neutropenia, anemia): wnl (06/18/2023) Liver function tests: wnl (01/15/2023) Hep B screening: completed S/sx of infection: none S/sx of UTI: none Pregnancy: none   Other adverse effects: Injection site reactions: none GI symptoms (diarrhea, nausea): none Headache: none     O:     Lab Results  Component Value Date   WBC 7.4 04/04/2023   HGB 15.2 04/04/2023   HCT 45.6 04/04/2023   MCV 93 04/04/2023   PLT 324 04/04/2023      Chemistry      Component Value Date/Time   NA 138 07/11/2022 0809   K 4.5 07/11/2022 0809   CL 101 07/11/2022 0809   CO2 21 07/11/2022 0809   BUN 8 07/11/2022 0809   CREATININE 0.72 07/11/2022 0809      Component Value Date/Time   CALCIUM 10.0 07/11/2022 0809   ALKPHOS 72 07/11/2022 0809   AST 13 07/11/2022 0809   ALT 11 07/11/2022 0809   BILITOT 0.6 07/11/2022 0809       A/P: 1. Medication review: patient is on line to start Kesimpta  for MS in December. Reviewed the medication with her including the following: Ofatumumab  is a recombinant human monoclonal IgG1 antibody that binds to human CD20 expressed on B-cells. The precise mechanism by which ofatumumab  exerts in therapeutic effects in multiple sclerosis is unknown, but presumed to involve binding to CD20, a cell surface antigen present on pre-B and mature B lymphocytes. Following cell surface binding to B lymphocytes, ofatumumab  results in antibody-dependent cellular cytolysis and  complement-mediated lysis. It is supplied as a Sensoready Pen. It should be kept at refrigerated temperature and the pen should be removed to allow to reach room temperature for about 15-30 minutes. It is injected subcutaneously and should be administered in the abdomen, thigh, or outer upper arm. Common adverse effects include injection site reactions, GI side effects, infection, and headache. Patients should be screened for HepB prior to use and liver function tests as well as blood counts should be monitored throughout therapy. No recommendation for any changes at this time.   Herlene Fleeta Morris, PharmD, JAQUELINE, CPP Clinical Pharmacist [facility] [phone]

## 2024-04-09 NOTE — Progress Notes (Signed)
 See OV from 04/09/24 for complete documentation.   Ashley Reed, PharmD, JAQUELINE, CPP Clinical Pharmacist [facility] [phone]

## 2024-04-21 DIAGNOSIS — M25422 Effusion, left elbow: Secondary | ICD-10-CM | POA: Diagnosis not present

## 2024-04-21 DIAGNOSIS — S53402A Unspecified sprain of left elbow, initial encounter: Secondary | ICD-10-CM | POA: Diagnosis not present

## 2024-04-22 ENCOUNTER — Other Ambulatory Visit (HOSPITAL_COMMUNITY): Payer: Self-pay

## 2024-04-23 ENCOUNTER — Other Ambulatory Visit (HOSPITAL_COMMUNITY): Payer: Self-pay

## 2024-04-23 MED ORDER — BLISOVI 24 FE 1-20 MG-MCG(24) PO TABS
1.0000 | ORAL_TABLET | Freq: Every day | ORAL | 0 refills | Status: DC
  Filled 2024-04-23: qty 56, 56d supply, fill #0

## 2024-04-24 ENCOUNTER — Other Ambulatory Visit: Payer: Self-pay

## 2024-04-27 ENCOUNTER — Other Ambulatory Visit: Payer: Self-pay

## 2024-04-27 ENCOUNTER — Other Ambulatory Visit (HOSPITAL_COMMUNITY): Payer: Self-pay

## 2024-04-28 ENCOUNTER — Other Ambulatory Visit (HOSPITAL_COMMUNITY): Payer: Self-pay

## 2024-04-28 DIAGNOSIS — N898 Other specified noninflammatory disorders of vagina: Secondary | ICD-10-CM | POA: Diagnosis not present

## 2024-04-29 ENCOUNTER — Other Ambulatory Visit: Payer: Self-pay

## 2024-04-29 ENCOUNTER — Other Ambulatory Visit (HOSPITAL_COMMUNITY): Payer: Self-pay

## 2024-04-29 MED ORDER — EMGALITY 120 MG/ML ~~LOC~~ SOAJ
1.0000 mL | SUBCUTANEOUS | 12 refills | Status: AC
  Filled 2024-04-29 (×4): qty 1, 30d supply, fill #0
  Filled 2024-05-22 – 2024-05-23 (×2): qty 1, 30d supply, fill #1
  Filled 2024-06-25: qty 1, 30d supply, fill #2
  Filled 2024-07-25: qty 1, 30d supply, fill #3
  Filled 2024-08-31: qty 1, 30d supply, fill #4
  Filled 2024-09-29 – 2024-10-07 (×3): qty 1, 30d supply, fill #5

## 2024-05-07 DIAGNOSIS — Z01419 Encounter for gynecological examination (general) (routine) without abnormal findings: Secondary | ICD-10-CM | POA: Diagnosis not present

## 2024-05-12 DIAGNOSIS — M25541 Pain in joints of right hand: Secondary | ICD-10-CM | POA: Diagnosis not present

## 2024-05-12 DIAGNOSIS — S81012A Laceration without foreign body, left knee, initial encounter: Secondary | ICD-10-CM | POA: Diagnosis not present

## 2024-05-22 ENCOUNTER — Other Ambulatory Visit (HOSPITAL_COMMUNITY): Payer: Self-pay

## 2024-05-23 ENCOUNTER — Other Ambulatory Visit (HOSPITAL_COMMUNITY): Payer: Self-pay

## 2024-06-04 DIAGNOSIS — S63656A Sprain of metacarpophalangeal joint of right little finger, initial encounter: Secondary | ICD-10-CM | POA: Diagnosis not present

## 2024-06-05 ENCOUNTER — Other Ambulatory Visit: Payer: Self-pay

## 2024-06-19 DIAGNOSIS — R300 Dysuria: Secondary | ICD-10-CM | POA: Diagnosis not present

## 2024-06-19 DIAGNOSIS — N390 Urinary tract infection, site not specified: Secondary | ICD-10-CM | POA: Diagnosis not present

## 2024-06-24 DIAGNOSIS — J069 Acute upper respiratory infection, unspecified: Secondary | ICD-10-CM | POA: Diagnosis not present

## 2024-06-26 DIAGNOSIS — S63656D Sprain of metacarpophalangeal joint of right little finger, subsequent encounter: Secondary | ICD-10-CM | POA: Diagnosis not present

## 2024-07-08 ENCOUNTER — Ambulatory Visit
Admission: RE | Admit: 2024-07-08 | Discharge: 2024-07-08 | Disposition: A | Discharge status: Home / Self Care | Payer: Self-pay | Source: Ambulatory Visit | Attending: Family Medicine | Admitting: Family Medicine

## 2024-07-08 VITALS — BP 149/92 | HR 84 | Temp 98.4°F | Resp 16 | Wt 188.0 lb

## 2024-07-08 DIAGNOSIS — H1033 Unspecified acute conjunctivitis, bilateral: Secondary | ICD-10-CM

## 2024-07-08 DIAGNOSIS — J069 Acute upper respiratory infection, unspecified: Secondary | ICD-10-CM

## 2024-07-08 MED ORDER — BENZONATATE 100 MG PO CAPS: 100.0000 mg | ORAL_CAPSULE | Freq: Three times a day (TID) | ORAL | 0 refills | Status: AC | PRN

## 2024-07-08 MED ORDER — TOBRAMYCIN-DEXAMETHASONE 0.3-0.1 % OP SUSP: 1.0000 [drp] | Freq: Four times a day (QID) | OPHTHALMIC | 0 refills | Status: AC

## 2024-07-08 NOTE — Discharge Instructions (Signed)
 TobraDex eyedrops--4 times daily in both eyes.  Take benzonatate  100 mg, 1 tab every 8 hours as needed for cough.

## 2024-07-08 NOTE — ED Triage Notes (Signed)
 I have had conjunctivitis in my eyes 3 times this year and am having the exact same symptoms along with some sort of upper respiratory infection - Entered by patient  Pt presents c/o URI and conjunctivitis x 4 days. Pt states,  I get this all the time and it's like the 4th time I've had it this year. I live in [place], [place] here for a wedding and my eye Dr.  told me to come to UC to get the drops refilled. I also have a productive cough, sneezing, and sore throat,   Pt denies emesis and diarrhea.

## 2024-07-08 NOTE — ED Provider Notes (Signed)
 [facility]    CSN: [id number] Arrival date & time: 07/08/24  1704      History   Chief Complaint Chief Complaint  Patient presents with   Eye Problem    I have had conjunctivitis in my eyes 3 times this year and am having the exact same symptoms along with some sort of upper respiratory infection - Entered by patient   URI    HPI Ashley Reed is a 23 y.o. female.    Eye Problem URI  Here for redness and drainage from both eyes.  This began bothering her about October 19.  She has also had nasal congestion and cough and sneezing and some mild sore throat.  No vomiting or diarrhea.  No fever  She does have a history of MS and takes a biologic medication for that.  She has done a home COVID swab that was negative  Her ophthalmologist recommend that she do a TobraDex prescription which she has used before.  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      Home Medications    Prior to Admission medications   Medication Sig Start Date End Date Taking? Authorizing Provider  benzonatate  (TESSALON ) 100 MG capsule Take 1 capsule (100 mg total) by mouth 3 (three) times daily as needed  for cough. 07/08/24  Yes Vonna Sharlet POUR, MD  fluconazole  (DIFLUCAN ) 150 MG tablet Take 1 tablet (150 mg total) by mouth daily. 01/03/24   Walker, Jamilla R, CNM  tobramycin-dexamethasone (TOBRADEX) ophthalmic solution Place 1 drop into both eyes every 6 (six) hours for 5 days. 07/08/24 07/13/24 Yes BanisterSharlet POUR, MD  Acetylcarnitine HCl, Nutrient, (ACETYL L-CARNITINE HCL) 500 MG CAPS Take 1 capsule by mouth daily.    [provider]  adapalene  (DIFFERIN ) 0.1 % cream APPLY PEA SIZED AMOUNT TO FINGER AND SPREAD THIN LAYER TO ACNE AREAS ONCE A DAY IN MORNING 10/13/21   Boscia, Heather  E, NP  albuterol  (VENTOLIN  HFA) 108 (90 Base) MCG/ACT inhaler Inhale 2 puffs into the lungs every 6 (six) hours as needed for wheezing or shortness of breath. Patient not taking: Reported on 01/01/2024 11/07/22   Boscia, Heather  E, NP  B Complex Vitamins (VITAMIN B COMPLEX PO) Take by mouth.    [provider]  cetirizine (ZYRTEC) 10 MG tablet Take 10 mg by mouth daily.    [provider]  clindamycin  (CLINDAGEL ) 1 % gel APPLY A THIN LAYER TO THE AFFECTED AREA(S)  2 TIMES PER DAY Patient not taking: Reported on 01/01/2024 08/08/22     Cyanocobalamin  (VITAMIN B-12 PO) Take by mouth.    [provider]  diclofenac  sodium (VOLTAREN ) 1 % GEL Apply  to large joint area up to three times a day as needed 02/20/18   Anderson Maude ORN, MD  EMGALITY  120 MG/ML SOAJ Inject 120 mg into the skin every 30 (thirty) days. 04/15/23     ergocalciferol  (VITAMIN D2) 1.25 MG (50000 UT) capsule Take 1 capsule (50,000 Units total) by mouth once a week. 01/01/23   Boscia, Heather  E, NP  fluticasone -salmeterol (ADVAIR  DISKUS) 250-50 MCG/ACT AEPB Inhale 1 puff into the lungs in the morning and at bedtime. Patient not taking: Reported on 01/01/2024 11/07/22   Boscia, Heather  E, NP  gabapentin  (NEURONTIN ) 300 MG capsule Take 1 capsule (300 mg total) by mouth 3 (three) times daily. 11/05/23   Lomax, Amy, NP   Galcanezumab -gnlm (EMGALITY ) 120 MG/ML SOAJ Administer 1 ml under the skin every 30 days 04/16/24     linaclotide  (LINZESS ) 145 MCG CAPS capsule Take 1 capsule (145 mcg total) by mouth daily before breakfast. 07/17/23   Wallace Joesph LABOR, PA  meloxicam  (MOBIC ) 15 MG tablet Take 1 tablet (15 mg total) by mouth daily. Patient not taking: Reported on 01/01/2024 11/09/20     Norethindrone  Acetate-Ethinyl Estrad-FE (BLISOVI  24 FE) 1-20 MG-MCG(24) tablet Take 1 tablet by mouth daily. 03/27/24     norethindrone -ethinyl estradiol -FE (BLISOVI  FE 1/20) 1-20 MG-MCG tablet TAKE 1 TABLET BY MOUTH ONCE DAILY. 11/08/22     Ofatumumab  (KESIMPTA ) 20 MG/0.4ML SOAJ Inject 20 mg under the skin once a week for weeks 0, 1, and 2. Skip week 3 and inject monthly starting at week 4. 04/09/24   Jegede, Olugbemiga E, MD  Ofatumumab  (KESIMPTA ) 20 MG/0.4ML SOAJ Inject 20 mg under the skin every 30 (thirty) days. 04/09/24   Jegede, Olugbemiga E, MD  ondansetron  (ZOFRAN -ODT) 4 MG disintegrating tablet DISSOLVE 1 TO 2 TABLETS BY MOUTH EVERY 8 HOURS AS NEEDED FOR NAUSEA AND VOMITING Patient not taking: Reported on 01/01/2024 11/13/23   Wallace Joesph A, PA  phentermine  15 MG capsule Take 1 capsule (15 mg total) by mouth every morning. 04/04/23   Wallace Joesph LABOR, PA  rizatriptan  (MAXALT -MLT) 10 MG disintegrating tablet Dissolve 1 tablet (10 mg total) by mouth as needed for migraine. May repeat in 2 hours if needed Patient not taking: Reported on 01/01/2024 10/08/22   Vear Charlie LABOR, MD  tiZANidine  (ZANAFLEX ) 2 MG tablet Take 1-2 tablets (2-4 mg total) by mouth at bedtime for spasticity 04/03/22   Sater, Charlie LABOR, MD  Ubrogepant  (UBRELVY ) 100 MG TABS Take 1 tablet (100 mg total) by mouth once as needed (REPEAT A 2ND DOSE IN 2 HOURS IF PAIN NOT ABORTED. MAXIMUM 2 TABLETS PER 24 HOURS.). 04/04/23       Family History Family History  Problem Relation Age of Onset   Fibromyalgia Mother    Bipolar disorder Father    Asthma Brother     Asthma Brother    Asthma Brother    Mental illness Brother     Social History Social History   Tobacco Use   Smoking status: Never    Passive exposure: Never   Smokeless tobacco: Never  Vaping Use   Vaping status: Never Used  Substance Use Topics   Alcohol use: Yes    Comment: on occasion   Drug use: Not Currently     Allergies   Patient has no known allergies.   Review of Systems Review of Systems   Physical Exam Triage Vital Signs ED Triage Vitals  Encounter Vitals Group     BP 07/08/24 1728 (!) 149/92  Girls Systolic BP Percentile --      Girls Diastolic BP Percentile --      Boys Systolic BP Percentile --      Boys Diastolic BP Percentile --      Pulse Rate 07/08/24 1728 84     Resp 07/08/24 1728 16     Temp 07/08/24 1728 98.4 F (36.9 C)     Temp Source 07/08/24 1728 Oral     SpO2 07/08/24 1728 98 %     Weight 07/08/24 1728 188 lb (85.3 kg)     Height --      Head Circumference --      Peak Flow --      Pain Score 07/08/24 1727 0     Pain Loc --      Pain Education --      Exclude from Growth Chart --    No data found.  Updated Vital Signs BP (!) 149/92 (BP Location: Left Arm)   Pulse 84   Temp 98.4 F (36.9 C) (Oral)   Resp 16   Wt 85.3 kg   LMP 06/17/2024 (Exact Date)   SpO2 98%   BMI 27.76 kg/m   Visual Acuity Right Eye Distance:   Left Eye Distance:   Bilateral Distance:    Right Eye Near:   Left Eye Near:    Bilateral Near:     Physical Exam Vitals reviewed.  Constitutional:      General: She is not in acute distress.    Appearance: She is not toxic-appearing.  HENT:     Right Ear: Tympanic membrane and ear canal normal.     Left Ear: Tympanic membrane and ear canal normal.     Nose: Congestion present.     Mouth/Throat:     Mouth: Mucous membranes are moist.     Comments: There is some clear mucus draining.  There is some mild patchy erythema of the soft palate.  Tonsils are 1+ Eyes:     Extraocular Movements:  Extraocular movements intact.     Pupils: Pupils are equal, round, and reactive to light.     Comments: There is injection of both conjunctiva bilaterally.  The lids are not swollen.  Cardiovascular:     Rate and Rhythm: Normal rate and regular rhythm.     Heart sounds: No murmur heard. Pulmonary:     Effort: No respiratory distress.     Breath sounds: No stridor. No wheezing, rhonchi or rales.  Musculoskeletal:     Cervical back: Neck supple.  Lymphadenopathy:     Cervical: No cervical adenopathy.  Skin:    Capillary Refill: Capillary refill takes less than 2 seconds.     Coloration: Skin is not jaundiced or pale.  Neurological:     General: No focal deficit present.     Mental Status: She is alert and oriented to person, place, and time.  Psychiatric:        Behavior: Behavior normal.      UC Treatments / Results  Labs (all labs ordered are listed, but only abnormal results are displayed) Labs Reviewed - No data to display  EKG   Radiology No results found.  Procedures Procedures (including critical care time)  Medications Ordered in UC Medications - No data to display  Initial Impression / Assessment and Plan / UC Course  I have reviewed the triage vital signs and the nursing notes.  Pertinent labs & imaging results that were available during my care of the  patient were reviewed by me and considered in my medical decision making (see chart for details).     Since she has a bottle of the TobraDex here and she has follow-up with an ophthalmologist, I am going to go ahead and do the TobraDex prescription instead of a plain antibiotic eyedrop.  Tessalon  Perles are also sent in for the cough.  She will follow-up with her usual doctors. Final Clinical Impressions(s) / UC Diagnoses   Final diagnoses:  Viral URI  Acute conjunctivitis of both eyes, unspecified acute conjunctivitis type     Discharge Instructions      TobraDex eyedrops--4 times daily in both  eyes.  Take benzonatate  100 mg, 1 tab every 8 hours as needed for cough.      ED Prescriptions     Medication Sig Dispense Auth. Provider   tobramycin-dexamethasone (TOBRADEX) ophthalmic solution Place 1 drop into both eyes every 6 (six) hours for 5 days. 5 mL Vonna Sharlet POUR, MD   benzonatate  (TESSALON ) 100 MG capsule Take 1 capsule (100 mg total) by mouth 3 (three) times daily as needed for cough. 21 capsule Georgios Kina K, MD      PDMP not reviewed this encounter.   Vonna Sharlet POUR, MD 07/08/24 [phone]

## 2024-07-22 DIAGNOSIS — H01004 Unspecified blepharitis left upper eyelid: Secondary | ICD-10-CM | POA: Diagnosis not present

## 2024-07-22 DIAGNOSIS — H01001 Unspecified blepharitis right upper eyelid: Secondary | ICD-10-CM | POA: Diagnosis not present

## 2024-07-22 DIAGNOSIS — L718 Other rosacea: Secondary | ICD-10-CM | POA: Diagnosis not present

## 2024-07-22 DIAGNOSIS — H10023 Other mucopurulent conjunctivitis, bilateral: Secondary | ICD-10-CM | POA: Diagnosis not present

## 2024-07-25 ENCOUNTER — Other Ambulatory Visit (HOSPITAL_COMMUNITY): Payer: Self-pay

## 2024-07-27 ENCOUNTER — Other Ambulatory Visit (HOSPITAL_COMMUNITY): Payer: Self-pay

## 2024-07-27 ENCOUNTER — Other Ambulatory Visit: Payer: Self-pay

## 2024-07-27 ENCOUNTER — Telehealth: Payer: Self-pay

## 2024-07-27 MED ORDER — BLISOVI 24 FE 1-20 MG-MCG(24) PO TABS
1.0000 | ORAL_TABLET | Freq: Every day | ORAL | 0 refills | Status: AC
  Filled 2024-07-27: qty 84, 84d supply, fill #0

## 2024-07-27 NOTE — Telephone Encounter (Signed)
 Pharmacy Patient Advocate Encounter   Received notification from Patient Pharmacy that prior authorization for Kesimpta  is required/requested.   Insurance verification completed.   The patient is insured through [facility].   Per test claim: PA required; PA submitted to above mentioned insurance via Latent Key/confirmation #/EOC [id number] Status is pending

## 2024-07-28 ENCOUNTER — Other Ambulatory Visit: Payer: Self-pay

## 2024-07-28 NOTE — Progress Notes (Signed)
 Specialty Pharmacy Initial Fill Coordination Note  Ashley Reed is a 23 y.o. female contacted today regarding initial fill of specialty medication(s) Ofatumumab  (Kesimpta )   Patient requested Delivery   Delivery date: 08/05/24   Verified address: [address]   Medication will be filled on: 08/04/24   Patient is aware of $0 copayment.

## 2024-07-28 NOTE — Telephone Encounter (Signed)
 Pharmacy Patient Advocate Encounter  Received notification from [facility] that Prior Authorization for Kesimpta  has been APPROVED from 07/27/24 to 07/13/25   PA #/Case ID/Reference #: [id number]

## 2024-08-21 ENCOUNTER — Other Ambulatory Visit: Payer: Self-pay

## 2024-08-24 ENCOUNTER — Other Ambulatory Visit: Payer: Self-pay

## 2024-08-24 DIAGNOSIS — E559 Vitamin D deficiency, unspecified: Secondary | ICD-10-CM | POA: Diagnosis not present

## 2024-08-24 DIAGNOSIS — G35A Relapsing-remitting multiple sclerosis: Secondary | ICD-10-CM | POA: Diagnosis not present

## 2024-08-24 DIAGNOSIS — Z8669 Personal history of other diseases of the nervous system and sense organs: Secondary | ICD-10-CM | POA: Diagnosis not present

## 2024-08-24 DIAGNOSIS — Z79899 Other long term (current) drug therapy: Secondary | ICD-10-CM | POA: Diagnosis not present

## 2024-08-24 DIAGNOSIS — D849 Immunodeficiency, unspecified: Secondary | ICD-10-CM | POA: Diagnosis not present

## 2024-08-24 MED ORDER — KESIMPTA 20 MG/0.4ML ~~LOC~~ SOAJ
SUBCUTANEOUS | 0 refills | Status: AC
  Filled 2024-08-24: qty 2.4, 180d supply, fill #0

## 2024-08-25 ENCOUNTER — Other Ambulatory Visit: Payer: Self-pay

## 2024-08-26 ENCOUNTER — Other Ambulatory Visit: Payer: Self-pay

## 2024-08-27 ENCOUNTER — Other Ambulatory Visit: Payer: Self-pay

## 2024-08-27 NOTE — Progress Notes (Signed)
 Specialty Pharmacy Refill Coordination Note  Ashley Reed is a 23 y.o. female contacted today regarding refills of specialty medication(s) Ofatumumab  (Kesimpta )   Patient requested Delivery   Delivery date: 09/15/24   Verified address: [address]   Medication will be filled on: 09/14/24  Next injection due 1.8.26

## 2024-08-31 ENCOUNTER — Other Ambulatory Visit: Payer: Self-pay

## 2024-09-01 DIAGNOSIS — H04123 Dry eye syndrome of bilateral lacrimal glands: Secondary | ICD-10-CM | POA: Diagnosis not present

## 2024-09-03 DIAGNOSIS — E559 Vitamin D deficiency, unspecified: Secondary | ICD-10-CM | POA: Diagnosis not present

## 2024-09-03 DIAGNOSIS — Z79899 Other long term (current) drug therapy: Secondary | ICD-10-CM | POA: Diagnosis not present

## 2024-09-03 DIAGNOSIS — G35A Relapsing-remitting multiple sclerosis: Secondary | ICD-10-CM | POA: Diagnosis not present

## 2024-09-14 ENCOUNTER — Other Ambulatory Visit: Payer: Self-pay

## 2024-09-29 ENCOUNTER — Other Ambulatory Visit: Payer: Self-pay

## 2024-09-30 ENCOUNTER — Other Ambulatory Visit: Payer: Self-pay

## 2024-09-30 ENCOUNTER — Encounter: Payer: Self-pay | Admitting: Pharmacist

## 2024-10-05 ENCOUNTER — Other Ambulatory Visit: Payer: Self-pay

## 2024-10-05 NOTE — Progress Notes (Unsigned)
 Pharmacy Patient Advocate Encounter  Insurance verification completed.   The patient is insured through [facility]   Ran test claim for Kesimpta . Co-pay is $715.  Copay card brings copay to $580. Manufacturer requires pharmacy to call for one time credit card   This test claim was processed through [facility]- copay amounts may vary at other pharmacies due to pharmacy/plan contracts, or as the patient moves through the different stages of their insurance plan.

## 2024-10-07 ENCOUNTER — Other Ambulatory Visit (HOSPITAL_COMMUNITY): Payer: Self-pay

## 2024-10-07 ENCOUNTER — Other Ambulatory Visit: Payer: Self-pay

## 2024-10-09 ENCOUNTER — Other Ambulatory Visit: Payer: Self-pay

## 2024-10-09 ENCOUNTER — Encounter (INDEPENDENT_AMBULATORY_CARE_PROVIDER_SITE_OTHER): Payer: Self-pay
